# Patient Record
Sex: Female | Born: 1955
Health system: Southern US, Community
[De-identification: ages and names within clinical notes are randomized; demographics above are authoritative.]

## PROBLEM LIST (undated history)

## (undated) DIAGNOSIS — G4730 Sleep apnea, unspecified: Secondary | ICD-10-CM

## (undated) DIAGNOSIS — R112 Nausea with vomiting, unspecified: Secondary | ICD-10-CM

## (undated) DIAGNOSIS — E039 Hypothyroidism, unspecified: Secondary | ICD-10-CM

## (undated) DIAGNOSIS — E785 Hyperlipidemia, unspecified: Secondary | ICD-10-CM

## (undated) DIAGNOSIS — K831 Obstruction of bile duct: Secondary | ICD-10-CM

## (undated) DIAGNOSIS — Z9889 Other specified postprocedural states: Secondary | ICD-10-CM

## (undated) HISTORY — PX: MYOMECTOMY: SHX85

## (undated) HISTORY — PX: TONSILLECTOMY: SHX5217

## (undated) HISTORY — DX: Sleep apnea, unspecified: G47.30

## (undated) HISTORY — PX: OTHER SURGICAL HISTORY: SHX169

## (undated) HISTORY — PX: CHOLECYSTECTOMY: SHX55

## (undated) HISTORY — DX: Hyperlipidemia, unspecified: E78.5

## (undated) HISTORY — PX: ERCP: SHX60

---

## 1999-09-09 ENCOUNTER — Other Ambulatory Visit: Admission: RE | Admit: 1999-09-09 | Discharge: 1999-09-09 | Payer: Self-pay | Admitting: Gynecology

## 2001-06-09 ENCOUNTER — Ambulatory Visit (HOSPITAL_COMMUNITY): Admission: RE | Admit: 2001-06-09 | Discharge: 2001-06-09 | Payer: Self-pay | Admitting: Gynecology

## 2001-06-09 ENCOUNTER — Encounter: Payer: Self-pay | Admitting: Gynecology

## 2001-06-28 ENCOUNTER — Ambulatory Visit (HOSPITAL_COMMUNITY): Admission: RE | Admit: 2001-06-28 | Discharge: 2001-06-28 | Payer: Self-pay | Admitting: Internal Medicine

## 2001-06-28 ENCOUNTER — Encounter: Payer: Self-pay | Admitting: Internal Medicine

## 2001-07-01 ENCOUNTER — Other Ambulatory Visit: Admission: RE | Admit: 2001-07-01 | Discharge: 2001-07-01 | Payer: Self-pay | Admitting: Gynecology

## 2003-07-04 ENCOUNTER — Other Ambulatory Visit: Admission: RE | Admit: 2003-07-04 | Discharge: 2003-07-04 | Payer: Self-pay | Admitting: Gynecology

## 2003-07-07 ENCOUNTER — Ambulatory Visit (HOSPITAL_COMMUNITY): Admission: RE | Admit: 2003-07-07 | Discharge: 2003-07-07 | Payer: Self-pay | Admitting: Gynecology

## 2004-02-12 ENCOUNTER — Ambulatory Visit (HOSPITAL_COMMUNITY): Admission: RE | Admit: 2004-02-12 | Discharge: 2004-02-12 | Payer: Self-pay | Admitting: Internal Medicine

## 2004-12-11 ENCOUNTER — Other Ambulatory Visit: Admission: RE | Admit: 2004-12-11 | Discharge: 2004-12-11 | Payer: Self-pay | Admitting: Gynecology

## 2005-03-10 ENCOUNTER — Ambulatory Visit (HOSPITAL_COMMUNITY): Admission: RE | Admit: 2005-03-10 | Discharge: 2005-03-10 | Payer: Self-pay | Admitting: Internal Medicine

## 2005-03-18 ENCOUNTER — Ambulatory Visit (HOSPITAL_COMMUNITY): Admission: RE | Admit: 2005-03-18 | Discharge: 2005-03-18 | Payer: Self-pay | Admitting: General Surgery

## 2005-03-18 ENCOUNTER — Encounter (INDEPENDENT_AMBULATORY_CARE_PROVIDER_SITE_OTHER): Payer: Self-pay | Admitting: Specialist

## 2005-03-27 ENCOUNTER — Ambulatory Visit: Payer: Self-pay | Admitting: Internal Medicine

## 2006-03-18 ENCOUNTER — Encounter (INDEPENDENT_AMBULATORY_CARE_PROVIDER_SITE_OTHER): Payer: Self-pay | Admitting: *Deleted

## 2006-03-18 ENCOUNTER — Ambulatory Visit (HOSPITAL_COMMUNITY): Admission: RE | Admit: 2006-03-18 | Discharge: 2006-03-18 | Payer: Self-pay | Admitting: Internal Medicine

## 2006-03-18 ENCOUNTER — Ambulatory Visit: Payer: Self-pay | Admitting: Internal Medicine

## 2006-05-11 ENCOUNTER — Ambulatory Visit: Payer: Self-pay | Admitting: Orthopedic Surgery

## 2006-05-25 ENCOUNTER — Emergency Department (HOSPITAL_COMMUNITY): Admission: EM | Admit: 2006-05-25 | Discharge: 2006-05-25 | Payer: Self-pay | Admitting: Emergency Medicine

## 2006-06-04 ENCOUNTER — Ambulatory Visit (HOSPITAL_COMMUNITY): Admission: RE | Admit: 2006-06-04 | Discharge: 2006-06-04 | Payer: Self-pay | Admitting: Internal Medicine

## 2006-06-15 ENCOUNTER — Ambulatory Visit: Payer: Self-pay | Admitting: Orthopedic Surgery

## 2007-04-30 ENCOUNTER — Ambulatory Visit (HOSPITAL_COMMUNITY): Admission: RE | Admit: 2007-04-30 | Discharge: 2007-04-30 | Payer: Self-pay | Admitting: Internal Medicine

## 2007-05-18 ENCOUNTER — Ambulatory Visit: Payer: Self-pay | Admitting: Orthopedic Surgery

## 2007-05-18 DIAGNOSIS — S52539A Colles' fracture of unspecified radius, initial encounter for closed fracture: Secondary | ICD-10-CM | POA: Insufficient documentation

## 2007-06-17 ENCOUNTER — Ambulatory Visit: Payer: Self-pay | Admitting: Orthopedic Surgery

## 2007-06-17 DIAGNOSIS — M7150 Other bursitis, not elsewhere classified, unspecified site: Secondary | ICD-10-CM | POA: Insufficient documentation

## 2007-09-20 ENCOUNTER — Emergency Department (HOSPITAL_COMMUNITY): Admission: EM | Admit: 2007-09-20 | Discharge: 2007-09-20 | Payer: Self-pay | Admitting: Emergency Medicine

## 2007-09-22 ENCOUNTER — Ambulatory Visit (HOSPITAL_COMMUNITY): Admission: RE | Admit: 2007-09-22 | Discharge: 2007-09-22 | Payer: Self-pay | Admitting: Internal Medicine

## 2007-10-04 ENCOUNTER — Ambulatory Visit (HOSPITAL_COMMUNITY): Admission: RE | Admit: 2007-10-04 | Discharge: 2007-10-04 | Payer: Self-pay | Admitting: Internal Medicine

## 2007-12-15 ENCOUNTER — Ambulatory Visit (HOSPITAL_COMMUNITY): Admission: RE | Admit: 2007-12-15 | Discharge: 2007-12-15 | Payer: Self-pay | Admitting: Internal Medicine

## 2008-02-21 ENCOUNTER — Ambulatory Visit (HOSPITAL_COMMUNITY): Admission: RE | Admit: 2008-02-21 | Discharge: 2008-02-21 | Payer: Self-pay | Admitting: Internal Medicine

## 2008-07-27 ENCOUNTER — Encounter: Payer: Self-pay | Admitting: Orthopedic Surgery

## 2008-07-27 LAB — CONVERTED CEMR LAB
ALT: 46 units/L — ABNORMAL HIGH (ref 0–35)
AST: 33 units/L (ref 0–37)
Albumin: 3.9 g/dL (ref 3.5–5.2)
Alkaline Phosphatase: 112 units/L (ref 39–117)
Bilirubin, Direct: 0.1 mg/dL (ref 0.0–0.3)

## 2008-08-01 ENCOUNTER — Ambulatory Visit (HOSPITAL_COMMUNITY): Admission: RE | Admit: 2008-08-01 | Discharge: 2008-08-01 | Payer: Self-pay | Admitting: Internal Medicine

## 2008-10-12 ENCOUNTER — Encounter (INDEPENDENT_AMBULATORY_CARE_PROVIDER_SITE_OTHER): Payer: Self-pay | Admitting: Internal Medicine

## 2008-10-12 ENCOUNTER — Ambulatory Visit (HOSPITAL_COMMUNITY): Admission: RE | Admit: 2008-10-12 | Discharge: 2008-10-13 | Payer: Self-pay | Admitting: Internal Medicine

## 2008-12-04 ENCOUNTER — Encounter: Payer: Self-pay | Admitting: Internal Medicine

## 2009-04-23 IMAGING — CR DG ABDOMEN 1V
2 series · 2 of 2 positions shown · non-contrast
Comparison: 09/22/2007 ERCP.

CLINICAL DATA: Recurrent biliary colic.  Previous placement of
stents within the pancreatic duct and common bile duct.
Reevaluation.

ABDOMEN - 1 VIEW

[view not recorded (1 of 2)]
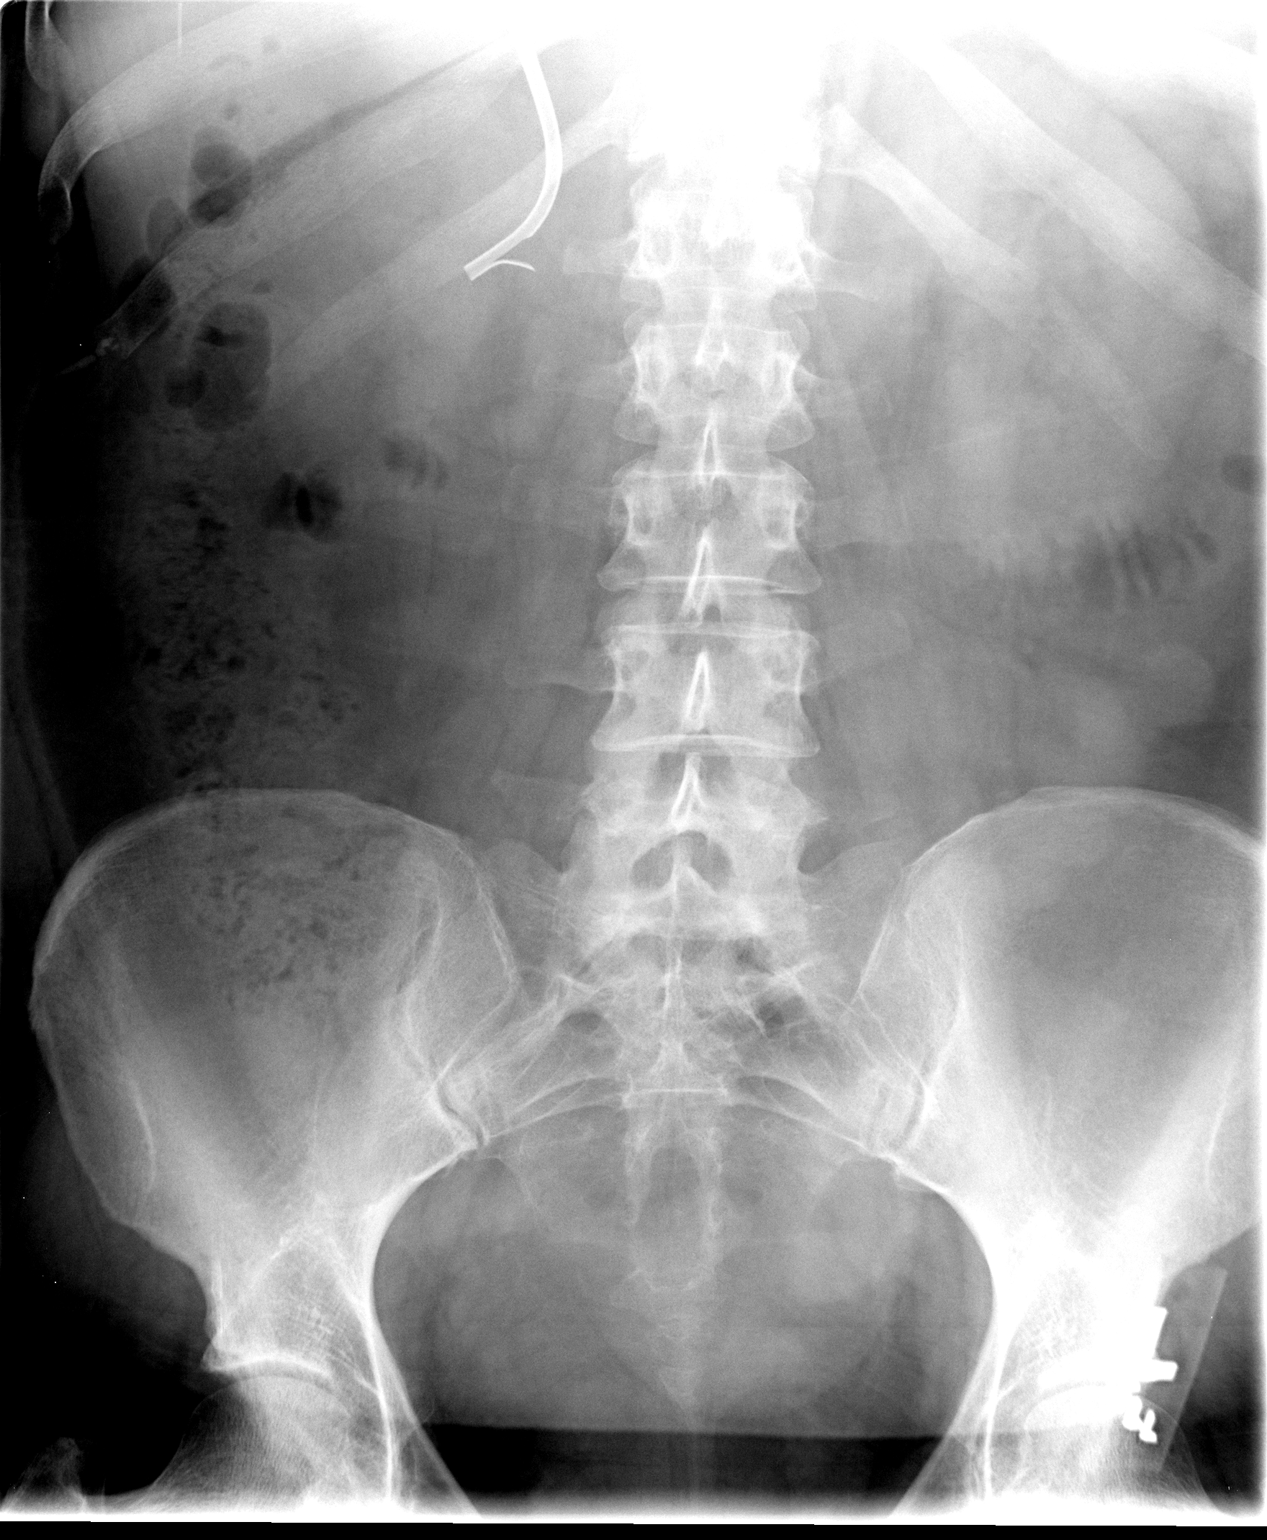

[view not recorded (2 of 2)]
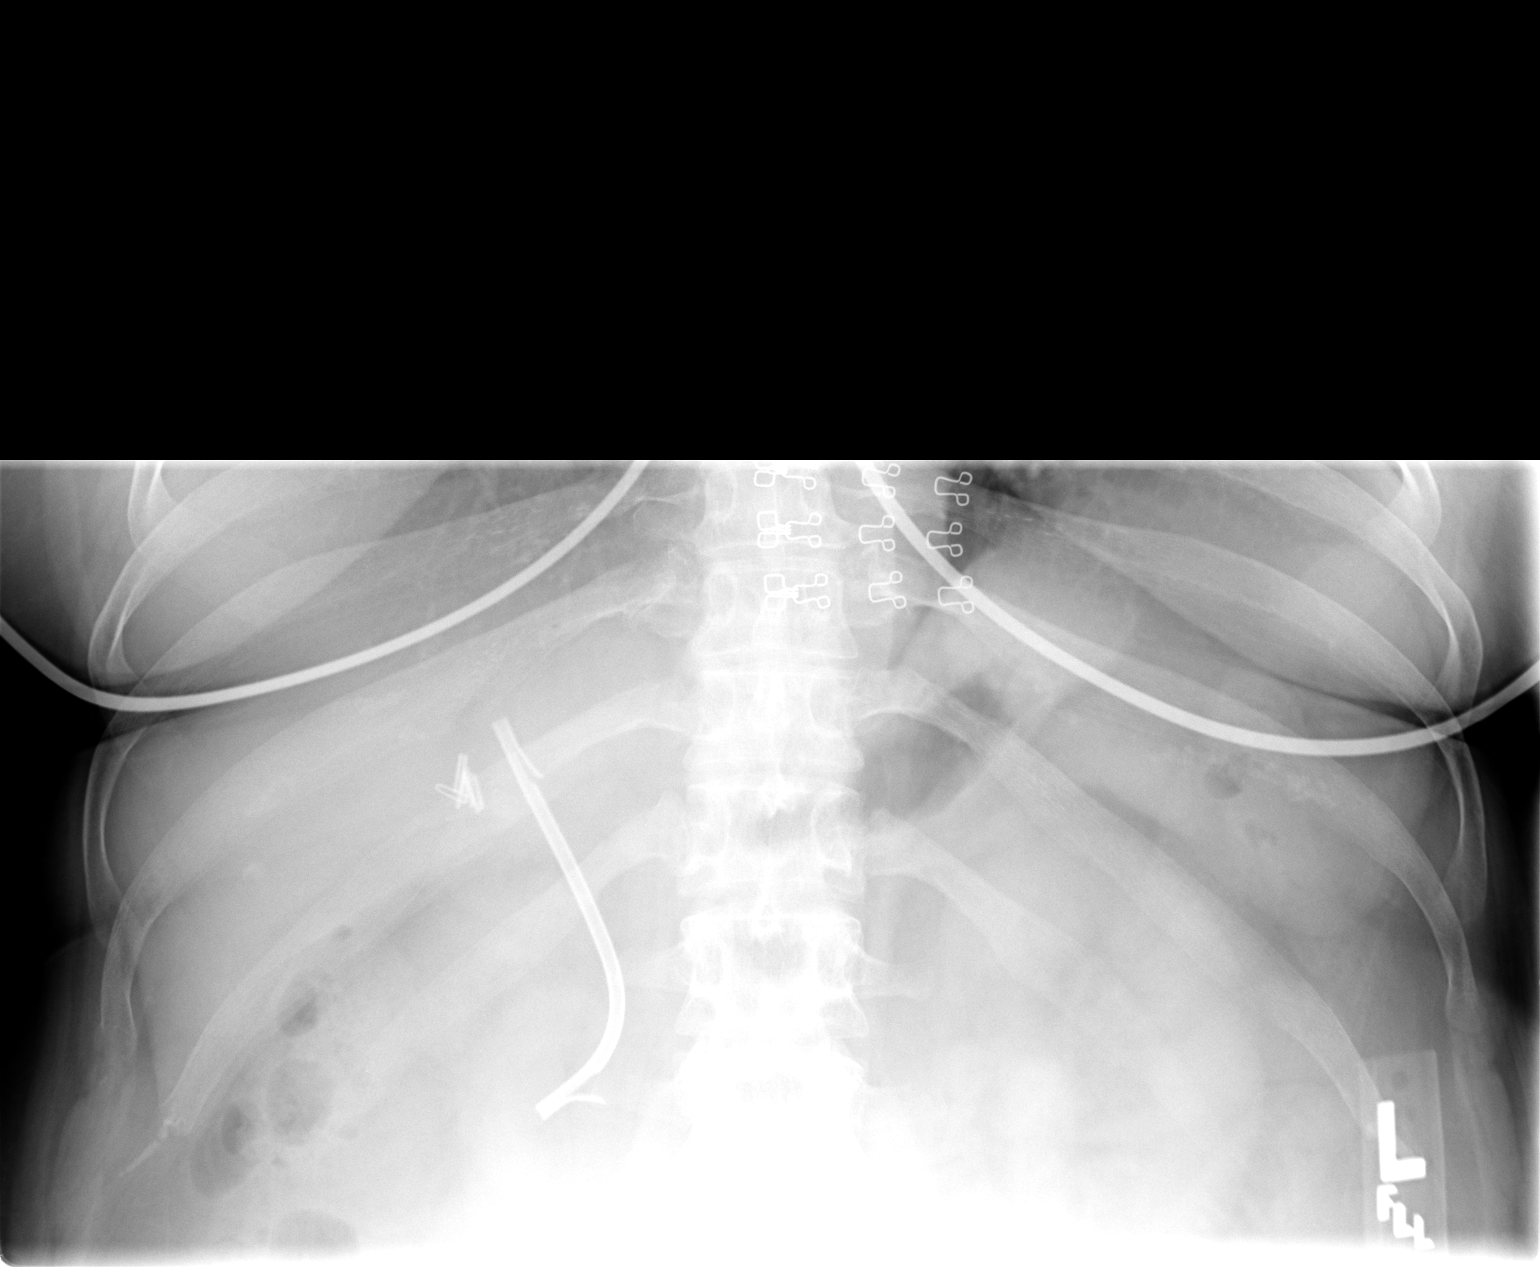

[2 of 2 positions shown; findings below may reference images not displayed]

FINDINGS: There is a stent present within the common bile duct.
The stent previously placed within the pancreatic duct is no longer
present.  The bowel gas pattern is normal.  There are surgical
clips within the right upper quadrant from prior cholecystectomy.
IMPRESSION: A stent remains in the region of the common bile duct.  The stent
appears in satisfactory position.  The pancreatic duct stent is no
longer present.

## 2010-06-16 ENCOUNTER — Encounter: Payer: Self-pay | Admitting: Gynecology

## 2010-06-17 ENCOUNTER — Encounter: Payer: Self-pay | Admitting: Internal Medicine

## 2010-07-01 ENCOUNTER — Ambulatory Visit (INDEPENDENT_AMBULATORY_CARE_PROVIDER_SITE_OTHER): Payer: Self-pay | Admitting: Internal Medicine

## 2010-09-03 LAB — HEPATIC FUNCTION PANEL
ALT: 86 U/L — ABNORMAL HIGH (ref 0–35)
AST: 53 U/L — ABNORMAL HIGH (ref 0–37)
Albumin: 4 g/dL (ref 3.5–5.2)
Alkaline Phosphatase: 160 U/L — ABNORMAL HIGH (ref 39–117)
Bilirubin, Direct: 0.1 mg/dL (ref 0.0–0.3)
Total Bilirubin: 0.5 mg/dL (ref 0.3–1.2)

## 2010-09-03 LAB — DIFFERENTIAL
Basophils Absolute: 0 10*3/uL (ref 0.0–0.1)
Eosinophils Absolute: 0.2 10*3/uL (ref 0.0–0.7)
Monocytes Relative: 6 % (ref 3–12)
Neutrophils Relative %: 73 % (ref 43–77)

## 2010-09-03 LAB — CBC
HCT: 41.7 % (ref 36.0–46.0)
WBC: 7.3 10*3/uL (ref 4.0–10.5)

## 2010-10-08 NOTE — Op Note (Signed)
NAME:  Cynthia Donaldson, Cynthia Donaldson               ACCOUNT NO.:  [id number]   MEDICAL RECORD NO.:  [id number]          PATIENT TYPE:  AMB   LOCATION:  DAY                           FACILITY:  [facility]   PHYSICIAN:  Lionel December, M.D.    DATE OF BIRTH:  01-Oct-1955   DATE OF PROCEDURE:  10/12/2008  DATE OF DISCHARGE:                               OPERATIVE REPORT   PROCEDURE:  Esophagogastroduodenoscopy.   INDICATIONS:  Cynthia Donaldson is a 55 year old Caucasian female with recurrent  epigastric pain and nausea.  Symptoms worsened in the morning.  She is  on PPI for chronic GERD and Hartmann, well controlled.  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  Her LFTs have been normal except mildly  elevated alkaline phosphatase.  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  At times,  she feels  the same pain that she had prior to sphincterotomy.  We have  not been able to document bump in transaminases with these episodes of  pain.  She is undergoing diagnostic EGD.  She has never been tested or  treated for H. pylori gastritis.  Procedure and risks were reviewed with  the patient and informed consent was obtained.   MEDICATIONS FOR CONSCIOUS SEDATION:  1. Cetacaine spray for pharyngeal topical anesthesia.  2. Demerol 50 mg IV.  3. Versed 8 mg IV in divided dose.   FINDINGS:  Procedure performed in endoscopy suite.  The patient's vital  signs and O2 sat were monitored during the procedure and remained  stable.  The patient was placed in left lateral recumbent position and  Pentax video scope was passed oropharynx without any difficulty into  esophagus.   Esophagus, mucosa of the esophagus was normal.  The GE junction was at  36 cm from the incisors.  There was focal erythema, but no erosions of  the ring was noted.  Hiatus was at 38 cm.   Stomach, it had some bile but there was no food debris.  Stomach  distended, very well insufflation.  Folds and  proximal stomach normal.  Examination of mucosa revealed few anterior erosions and focal erythema,  but no ulcer crater was noted.  Pyloric channel was patent.  Angularis,  fundus, and cardia were examined by retroflexing the scope were normal.   Duodenum, bulbar mucosa was normal.  Scope was passed to second part of  the duodenal mucosa and folds were normal.  Only the low part of the  ampulla was seen with this forward-viewing scope.  Endoscope was  withdrawn.   On the way out, antral biopsies were taken looking for H. pylori  gastritis or eosinophilic gastritis.  Endoscope was withdrawn.  The  patient tolerated the procedure well.  Please note, procedure was also  attended by Dr. Jena Gauss.   FINAL DIAGNOSIS:  Small sliding hiatal hernia with mild focal changes of  reflux esophagitis at gastroesophageal junction.   Erosive antral gastritis.   No evidence of peptic ulcer disease or pyloric stenosis.   Antral biopsies taken for routine histology.   RECOMMENDATIONS:  She will continue antireflux measures and Zegerid as  before.  Trial with Carafate liquid 2 g at bedtime and 1 g 1 hour before  breakfast.  Prescription given for 60 g with 5 refills.   We will check CBC with diff and LFTs today and I will be contacting Ms.  Talisa with the results of biopsy and blood test.      Lionel December, M.D.  Electronically Signed     NR/MEDQ  D:  10/12/2008  T:  10/13/2008  Job:  [id number]   cc:   Kingsley Callander. Ouida Sills, MD  Fax: [phone]

## 2010-10-08 NOTE — Op Note (Signed)
NAME:  Cynthia Donaldson, HONE                  ACCOUNT NO.:  [id number]   MEDICAL RECORD NO.:  [id number]          PATIENT TYPE:  AMB   LOCATION:  DAY                           FACILITY:  [facility]   PHYSICIAN:  Lionel December, M.D.    DATE OF BIRTH:  [birth year]   DATE OF PROCEDURE:  DATE OF DISCHARGE:                               OPERATIVE REPORT   PROCEDURE:  Endoscopic retrograde cholangiopancreatography with biliary  stent removal.   INDICATION:  Cynthis is a 55 year old Caucasian female with history of  biliary colic surgery to sphincter of Oddi dysfunction.  She had ERCP  with sphincterotomy and PD and biliary stenting on September 22, 2007.  She  passed the pancreatic stents spontaneously.  She has done well except  she has had fleeting pain, which has been lateral to the pain that she  used to have before.  She is undergoing ERCP for removal of stent,  repeat cholangiography, and extension of the sphincterotomy if  necessary.  Procedure risks were reviewed with the patient and informed  consent was obtained.   MEDS:  Versed sedation.   ANESTHESIA:  Please see anesthesia records for details.   FINDINGS:  Procedure performed in the OR.  The patient was placed under  anesthesia.  She was intubated and turned into semi-prone position.  Therapeutic Pentax videoduodenoscope was passed via the oropharynx,  esophagus, across the stomach and pylorus into the bulb and descending  duodenum and ampulla.  The stent was in place.  The distal port was  completely filled with debris.  There was some mild draining around the  stent.  The stent was cut with snare and removed under fluoroscopic  control.  Endoscope was passed into the second part of duodenum.  A CBD  was easily was cannulated with RX44 autotome and 0.035 Hydra Jagwire.  Dilute contrast was injected.  There was a free flow of contrast in the  duodenum.  Therefore, occlusion cholangiogram was obtained.  There were  no filling defects noted.  CBD  and CHD were mildly dilated.  About 8-10  mm, most pronounced at the level of common hepatic ducts.  Intrahepatic  biliary radicles were normal.  There was sigmoid shape to the distal  segment of CBD and a 10-12 balloon was passed across the ampulla a  couple of times without significant resistance.  There was excellent  flow of contrast in the duodenum.  Therefore, sphincterotomy was not  extended, and the scope was withdrawn.  The patient tolerated the  procedure well, and she was extubated and brought to PACU in stable  condition.   FINAL DIAGNOSES:  1. Biliary stent removed.  2. Mildly dilated biliary system, but no evidence of stones or      stricture.  Distal segment of bile duct, sigmoid shape without      stricture.   RECOMMENDATIONS:  1. She will be going home later today, when she is recovered from      anesthesia.  2. She will be on clear liquids today, and resume her usual diet in  a.m.  3. She will have LFTs repeated in 6-8 weeks.      Lionel December, M.D.  Electronically Signed     NR/MEDQ  D:  12/15/2007  T:  12/16/2007  Job:  [id number]   cc:   Kingsley Callander. Ouida Sills, MD  Fax: [phone]

## 2010-10-08 NOTE — Op Note (Signed)
NAME:  Cynthia Donaldson, Cynthia Donaldson                  ACCOUNT NO.:  [id number]   MEDICAL RECORD NO.:  [id number]          PATIENT TYPE:  AMB   LOCATION:  DAY                           FACILITY:  [facility]   PHYSICIAN:  Lionel December, M.D.    DATE OF BIRTH:  1956-02-09   DATE OF PROCEDURE:  09/22/2007  DATE OF DISCHARGE:                                PROCEDURE NOTE   PROCEDURES:  1. ERCP with biliary sphincterotomy.  2. Biliary and pancreatic stenting.   INDICATION:  Cynthia Donaldson is a 55 year old Caucasian female with recurrent right  upper quadrant pain associated with bump in her transaminases.  She has  had at least 2 well-documented episodes.  She also has dilated biliary  system.  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  She is undergoing therapeutic procedure.  Procedure risks  were reviewed with the patient and informed consent was obtained.   MEDS:  Sedation.   ANESTHESIA:  Please see anesthesia records for details.   FINDINGS:  Procedure performed in the OR.  After the patient was placed  under anesthesia, she was intubated and turned into semi-prone position.  Pentax video duodenoscope was passed via the oropharynx without any  difficulty into esophagus and stomach.  Gastric mucosa was normal.  Pyloric exam was patent.  Scope was easily passed into the bulb and  descending duodenum.  Ampulla of Vater was normal.  A short intramural  segment.  The approach was ideal and optimal.  Cannulation was attempted  with RX 44 autotome and 0.035 Hydra Jagwire.  Pancreatic duct was  initially cannulated and gently filled with diluted contrast.  No  defects were noted.  Guidewire was left in the pancreatic duct and the  sphincterotome was reloaded with another wire and cannulation attempted.  Difficult to selectively cannulate the CBD.  Once it was cannulated,  noted significant resistance as I passed the sphincterotome over with  guidewire.  Dilute contrast was injected into the  biliary system.  CBD  and CHD were dilated with distal tapering, no filling defects noted.  CHD measured 12-13 mm in maximal diameter.  Intrahepatic biliary  radicles were well felt and no ductal abnormalities noted (please note  that the patient has biopsy-proven PBC).  Biliary sphincterotomy was  performed.  The draining still was not adequate.  I was able to pass 10-  mm balloon across the papilla.  There appeared to be a membrane wrapped  around the catheter, roof of ampulla appeared to have 2 different  layers.  I therefore decided to leave the biliary stent.  A 10-French 7-  cm long plastic stent was placed for biliary decompression without any  difficulty, followed by placement of 4-French 5-cm long single-pigtail  pancreatic stent.  Endoscope was withdrawn.  The patient was extubated  and taken to PACU.  She tolerated the procedure well.   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  2. Sphincterotomy performed and biliary stent left in place.  3. A 4-French 5-cm single-pigtail pancreatic stent also left in place  to prevent risk of pancreatitis.   RECOMMENDATIONS:  If she does well, she will be going home later this  morning, clear liquids today, and she will start usual diet in the a.m.  She will resume her Synthroid, Urso and Wellbutrin as before.   She will return in for repeat procedure in 6-8 weeks.      Lionel December, M.D.  Electronically Signed     NR/MEDQ  D:  09/22/2007  T:  09/23/2007  Job:  [id number]   cc:   Kingsley Callander. Ouida Sills, MD  Fax: [phone]   R. Roetta Sessions, M.D.  [address]  [place] [address]

## 2010-10-11 NOTE — Op Note (Signed)
NAME:  Cynthia Donaldson, Cynthia Donaldson                  ACCOUNT NO.:  [id number]   MEDICAL RECORD NO.:  [id number]          PATIENT TYPE:  AMB   LOCATION:  DAY                          FACILITY:  [facility]   PHYSICIAN:  Angelia Mould. Derrell Lolling, M.D.DATE OF BIRTH:  Mar 28, 1956   DATE OF PROCEDURE:  03/18/2005  DATE OF DISCHARGE:                                 OPERATIVE REPORT   PREOPERATIVE DIAGNOSES:  1.  Chronic cholecystitis with possible cholelithiasis.  2.  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   POSTOPERATIVE DIAGNOSES:  1.  Chronic cholecystitis with possible cholelithiasis.  2.  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   OPERATION PERFORMED:  1.  Multiple 14 gauge core needle liver biopsies.  2.  Laparoscopic cholecystectomy with intraoperative cholangiogram.   SURGEON:  Dr. Claud Kelp   FIRST ASSISTANT:  Dr. Luretha Murphy   OPERATIVE INDICATION:  This is a 55 year old white female, who has had  symptoms consistent with biliary colic for many years.  She has had more  nausea recently.  She has never had fever.  For the past 3 years, she has  been noted have elevated liver function tests with mild elevation of  transaminases and more pronounced elevation of alkaline phosphatase over  250.  Her other lab work for hepatitis and other diseases have always been  normal.  Her ultrasounds have always been normal except for a fatty liver.  Recently her alkaline phosphatase shot up to 405, and she had more blood  work, and her antimitochondrial antibody was up to 2.1, and hepatitis  studies remained normal, and her gastroenterologist, Dr. Karilyn Cota, became  concerned that she might have primary biliary cirrhosis.  She had another  ultrasound which showed possible small gallstone, borderline biliary  dilatation. and minimal fatty infiltration of the liver.  I discussed her  care with her husband, Dr. Roetta Sessions, who is a gastroenterologist in  [place].  She is brought to the operating room electively for  liver  biopsy and cholecystectomy.   OPERATIVE FINDINGS:  The liver was smooth but slightly discolored consistent  with fatty infiltration.  There were no nodularities or masses in the liver.  There was no ascites.  The peritoneal surfaces were normal.  The gallbladder  was chronically inflamed.  There were extensive adhesions throughout the  gallbladder.  Only the fundus was visible initially, and we had to take down  extensive chronic adhesions, suggesting that she had had multiple prior  inflammatory episodes.  The anatomy of the cystic duct, cystic artery, and  common bile duct were conventional.  The cholangiogram showed a normal  biliary tree.  No strictures or dilatations.  There were no filling defects,  no obstruction, and the contrast went into the duodenum well.  There was no  sign of any stones in the biliary tree.  After I removed the gallbladder, I  opened the gallbladder and did not find any stones, but the gallbladder was  chronically inflamed.  The small bowel, large bowel, stomach, duodenum  otherwise looked normal.   OPERATIVE TECHNIQUE:  Following the induction of general endotracheal  anesthesia, the patient  was identified.  Intravenous antibiotics were given.  The abdomen was prepped and draped in a sterile fashion.  Marcaine 0.5% with  epinephrine was used as a local infiltration anesthetic.  A vertically  oriented incision was made at the lower rim of the umbilicus through a  previous scar.  The fascia was incised in the midline and the abdominal  cavity entered under direct vision.  A 10 mm Hassan trocar was inserted and  secured with a pursestring suture of 0 Vicryl.  Pneumoperitoneum was  created.  Video cam was inserted with visualization and findings as  described above.  A 10 mm trocar was placed in the subxiphoid region and two  5 mm trocars placed in the right mid abdomen.  We made a small incision in  the subcostal space on the right side in the  midclavicular line.  We took  three 14 gauge core needle liver biopsies, 2 from the right lobe and 1 from  the medial segment of the left lobe.  These were all good, complete cores,  and they were sent to the lab for histology.  We cauterized the capsule of  liver where we stuck the needle, and hemostasis was good.  We irrigated out  a little bit of blood clot that had accumulated and then turned our  attention to the gallbladder.   The fundus of the gallbladder was elevated.  We carefully took down all the  chronic adhesions until we could identify the body and infundibulum of the  gallbladder.  We dissected out the cystic duct and cystic artery. We  isolated the cystic artery as it went on the wall of the gallbladder,  secured it with multiple clips and divided it.  We created a large window  behind the cystic duct.  A metal clip was placed on the cystic duct close to  the gallbladder.  A cholangiogram catheter was inserted into the cystic duct  and a cholangiogram obtained using the C-arm.  This showed a borderline  dilated biliary tree but no other was structural or anatomic abnormalities,  no filling defect, and there was good flow of contrast into the duodenum.  The cholangiogram catheter was removed.  The cystic duct was secured with  multiple metal clips and divided.  The gallbladder was dissected from its  bed with electrocautery, placed in a specimen bag, and removed.  It should  be noted that this dissection was somewhat difficult due to chronic  inflammation.  The bed of the gallbladder was irrigated copiously.  Hemostasis was excellent and achieved with electrocautery.  We irrigated out  the subhepatic space and the subphrenic space and at the completion of the  case, all of the irrigation fluid was completely clear.  There was no  bleeding and no bile leak whatsoever.  The trocars were removed under direct vision, and there was no bleeding from the trocar sites.  The   pneumoperitoneum was released.  The fascia and the umbilicus was closed with  0 Vicryl sutures.  Skin incisions were closed subcuticular sutures of 4-0  Vicryl and Steri-Strips.  Clean bandages were placed and the patient taken  to recovery in stable condition.  The estimated blood loss was about 25 mL.  Complications were none.  Sponge and instrument counts were correct.      Angelia Mould. Derrell Lolling, M.D.  Electronically Signed     HMI/MEDQ  D:  03/18/2005  T:  03/18/2005  Job:  [id number]   cc:  Jonathon Bellows, M.D.  [address]  [place]  [place] [address]   Lionel December, M.D.  [address]  [place] [address]   Kingsley Callander. Ouida Sills, MD  Fax: [phone]

## 2010-10-11 NOTE — Op Note (Signed)
NAME:  Cynthia Donaldson, Cynthia Donaldson                  ACCOUNT NO.:  [id number]   MEDICAL RECORD NO.:  [id number]          PATIENT TYPE:  AMB   LOCATION:  DAY                           FACILITY:  [facility]   PHYSICIAN:  Lionel December, M.D.    DATE OF BIRTH:  Jan 09, 1956   DATE OF PROCEDURE:  03/18/2006  DATE OF DISCHARGE:                                 OPERATIVE REPORT   PROCEDURE:  Esophagogastroduodenoscopy followed by colonoscopy.   INDICATIONS:  Macayla is a 55 year old Caucasian female who has recurrent  epigastric and right upper quadrant pain.  She has early stage PVC and is on  ___________ with normalization of her transaminases.  She is status post  cholecystectomy last year.  She is undergoing diagnostic EGD.  She is also  undergoing high-risk screening colonoscopy.  Her brother was 73 years old  was recently diagnosed with metastatic colon carcinoma.  Procedure risks  were reviewed with the patient, informed consent was obtained.   MEDS FOR CONSCIOUS SEDATION:  Benzocaine spray for pharyngeal topical  anesthesia, Demerol 50 mg IV, Versed 8 mg IV.  Zofran 4 mg IV given at the  end of the procedures.   FINDINGS:  Procedures performed in endoscopy suite.  The patient's vital  signs and O2 saturation were monitored during the procedure and remained  stable.   PROCEDURE #1:  Esophagogastroduodenoscopy.  The patient was placed left  lateral position.  Olympus videoscope was passed via oropharynx without any  difficulty into esophagus.   Esophagus. Mucosa of the esophagus was normal.  No varices were identified.  There was 6 x 8 mm patch of gastric type mucosa proximal to GE junction  which was biopsied for histology on the way out.  GE junction was at 38 cm  and hiatus was at 40.   Stomach was empty and distended very well with insufflation.  Folds of  proximal stomach were normal.  Examination mucosa at body, antrum, pyloric  channel as well as angularis, fundus and cardia was normal.   Duodenum  bulbar mucosa was normal.  Scope was passed into second part of  duodenum where mucosa and folds were normal.  Endoscope was withdrawn and  the patient prepared for procedure #2.   PROCEDURE #2:  Colonoscopy.  Rectal examination performed.  No abnormality  noted on external or digital exam.  Olympus videoscope was placed in rectum  and advanced under vision into sigmoid colon and beyond.  Preparation was  excellent except she had some brown liquid in cecum which was suctioned out.  Cecum was identified by ileocecal valve and appendiceal orifice.  The area  behind the valve was well seen was normal.  As the scope was withdrawn  colonic mucosa was examined for the second time and was normal throughout.  Rectal mucosa similarly was normal.  Scope was retroflexed to examine  anorectal junction and single small hemorrhoids noted below the dentate  line.  Endoscope was straightened and withdrawn.  The patient tolerated the  procedure well.   FINAL DIAGNOSIS:  Small sliding hiatal hernia.  Small patch of gastric type  mucosa proximal but contiguous with GE junction which was biopsied for  histology to make sure we not dealing with short segment Barrett's.  Normal  exam of stomach, first and second part of duodenum.  Normal colonoscopy  except small external hemorrhoids.   RECOMMENDATIONS:  We will check her LFTs today.   We will hold her __________ 1-2 weeks and see if her abdominal pain gets any  better.  If not we will need further evaluation.      Lionel December, M.D.  Electronically Signed     NR/MEDQ  D:  03/18/2006  T:  03/19/2006  Job:  [id number]   cc:   Kingsley Callander. Ouida Sills, MD  Fax: [phone]

## 2010-12-19 ENCOUNTER — Other Ambulatory Visit (HOSPITAL_COMMUNITY): Payer: Self-pay | Admitting: Internal Medicine

## 2010-12-19 DIAGNOSIS — Z139 Encounter for screening, unspecified: Secondary | ICD-10-CM

## 2010-12-25 ENCOUNTER — Ambulatory Visit: Payer: Self-pay | Admitting: Orthopedic Surgery

## 2010-12-27 ENCOUNTER — Encounter: Payer: Self-pay | Admitting: *Deleted

## 2010-12-27 ENCOUNTER — Inpatient Hospital Stay (HOSPITAL_COMMUNITY)
Admission: EM | Admit: 2010-12-27 | Discharge: 2010-12-27 | DRG: 446 | Disposition: A | Discharge status: Home / Self Care | Payer: 59 | Attending: Internal Medicine | Admitting: Internal Medicine

## 2010-12-27 ENCOUNTER — Emergency Department (HOSPITAL_COMMUNITY): Payer: 59

## 2010-12-27 DIAGNOSIS — K838 Other specified diseases of biliary tract: Principal | ICD-10-CM | POA: Diagnosis present

## 2010-12-27 DIAGNOSIS — E039 Hypothyroidism, unspecified: Secondary | ICD-10-CM | POA: Diagnosis present

## 2010-12-27 DIAGNOSIS — D259 Leiomyoma of uterus, unspecified: Secondary | ICD-10-CM | POA: Diagnosis present

## 2010-12-27 DIAGNOSIS — R7989 Other specified abnormal findings of blood chemistry: Secondary | ICD-10-CM

## 2010-12-27 DIAGNOSIS — K745 Biliary cirrhosis, unspecified: Secondary | ICD-10-CM | POA: Diagnosis present

## 2010-12-27 DIAGNOSIS — K8050 Calculus of bile duct without cholangitis or cholecystitis without obstruction: Secondary | ICD-10-CM

## 2010-12-27 HISTORY — DX: Obstruction of bile duct: K83.1

## 2010-12-27 HISTORY — DX: Hypothyroidism, unspecified: E03.9

## 2010-12-27 LAB — CBC
HCT: 43 % (ref 36.0–46.0)
Hemoglobin: 14.8 g/dL (ref 12.0–15.0)
MCH: 30.7 pg (ref 26.0–34.0)
MCHC: 34.4 g/dL (ref 30.0–36.0)
MCV: 89.2 fL (ref 78.0–100.0)
RBC: 4.82 MIL/uL (ref 3.87–5.11)

## 2010-12-27 LAB — COMPREHENSIVE METABOLIC PANEL
AST: 344 U/L — ABNORMAL HIGH (ref 0–37)
Albumin: 4 g/dL (ref 3.5–5.2)
Alkaline Phosphatase: 276 U/L — ABNORMAL HIGH (ref 39–117)
BUN: 22 mg/dL (ref 6–23)
CO2: 21 mEq/L (ref 19–32)
Calcium: 10 mg/dL (ref 8.4–10.5)
Chloride: 99 mEq/L (ref 96–112)
Creatinine, Ser: 0.82 mg/dL (ref 0.50–1.10)
GFR calc Af Amer: >60 mL/min (ref >60)
GFR calc non Af Amer: >60 mL/min (ref >60)
Glucose, Bld: 146 mg/dL — ABNORMAL HIGH (ref 70–99)
Sodium: 139 mEq/L (ref 135–145)
Total Bilirubin: 2.3 mg/dL — ABNORMAL HIGH (ref 0.3–1.2)
Total Protein: 8.2 g/dL (ref 6.0–8.3)

## 2010-12-27 LAB — DIFFERENTIAL
Eosinophils Absolute: 0.1 10*3/uL (ref 0.0–0.7)
Monocytes Relative: 1 % — ABNORMAL LOW (ref 3–12)

## 2010-12-27 LAB — URINALYSIS, ROUTINE W REFLEX MICROSCOPIC: Hgb urine dipstick: NEGATIVE

## 2010-12-27 LAB — HEPATIC FUNCTION PANEL
AST: 218 U/L — ABNORMAL HIGH (ref 0–37)
Alkaline Phosphatase: 196 U/L — ABNORMAL HIGH (ref 39–117)
Bilirubin, Direct: 0.4 mg/dL — ABNORMAL HIGH (ref 0.0–0.3)
Total Bilirubin: 1 mg/dL (ref 0.3–1.2)

## 2010-12-27 MED ORDER — SODIUM CHLORIDE 0.9 % IV BOLUS (SEPSIS)
500.0000 mL | Freq: Once | INTRAVENOUS | Status: AC
  Administered 2010-12-27: 500 mL via INTRAVENOUS

## 2010-12-27 MED ORDER — HYDROMORPHONE HCL 1 MG/ML IJ SOLN: 1.0000 mg | INTRAMUSCULAR | Status: DC | PRN

## 2010-12-27 MED ORDER — HYDROMORPHONE HCL 1 MG/ML IJ SOLN
1.0000 mg | Freq: Once | INTRAMUSCULAR | Status: AC
  Administered 2010-12-27: 1 mg via INTRAVENOUS
  Filled 2010-12-27: qty 1

## 2010-12-27 MED ORDER — SODIUM CHLORIDE 0.9 % IV SOLN
INTRAVENOUS | Status: DC
  Administered 2010-12-27: 09:00:00 via INTRAVENOUS

## 2010-12-27 MED ORDER — ONDANSETRON HCL 4 MG/2ML IJ SOLN
4.0000 mg | Freq: Once | INTRAMUSCULAR | Status: AC
  Administered 2010-12-27: 4 mg via INTRAVENOUS
  Filled 2010-12-27: qty 2

## 2010-12-27 MED ORDER — SODIUM CHLORIDE 0.9 % IV BOLUS (SEPSIS)
1000.0000 mL | Freq: Once | INTRAVENOUS | Status: AC
  Administered 2010-12-27: 1000 mL via INTRAVENOUS

## 2010-12-27 MED ORDER — IOHEXOL 300 MG/ML  SOLN
100.0000 mL | Freq: Once | INTRAMUSCULAR | Status: AC | PRN
  Administered 2010-12-27: 100 mL via INTRAVENOUS

## 2010-12-27 MED ORDER — ONDANSETRON HCL 4 MG/2ML IJ SOLN
4.0000 mg | Freq: Four times a day (QID) | INTRAMUSCULAR | Status: DC | PRN
  Administered 2010-12-27: 4 mg via INTRAVENOUS
  Filled 2010-12-27: qty 2

## 2010-12-27 MED ORDER — METOCLOPRAMIDE HCL 5 MG/ML IJ SOLN
10.0000 mg | Freq: Once | INTRAMUSCULAR | Status: AC
  Administered 2010-12-27: 10 mg via INTRAVENOUS
  Filled 2010-12-27: qty 2

## 2010-12-27 MED ORDER — SODIUM CHLORIDE 0.9 % IV SOLN
Freq: Once | INTRAVENOUS | Status: AC
  Administered 2010-12-27: 03:00:00 via INTRAVENOUS

## 2010-12-27 MED ORDER — PANTOPRAZOLE SODIUM 40 MG IV SOLR
40.0000 mg | Freq: Once | INTRAVENOUS | Status: AC
  Administered 2010-12-27: 40 mg via INTRAVENOUS
  Filled 2010-12-27: qty 40

## 2010-12-27 MED ORDER — PANTOPRAZOLE SODIUM 40 MG IV SOLR: 40.0000 mg | INTRAVENOUS | Status: DC

## 2010-12-27 MED ORDER — ONDANSETRON HCL 4 MG PO TABS: 4.0000 mg | ORAL_TABLET | Freq: Four times a day (QID) | ORAL | Status: DC | PRN

## 2010-12-27 NOTE — Consult Note (Signed)
[facility] Spoke with Dr. Karilyn Cota. Advised him of CT findings and labs. He agreed patient should e admitted to the hospital and undergo ERCP. Asked that we call PMD  (Dr. Hilbert Corrigan admission.

## 2010-12-27 NOTE — ED Provider Notes (Signed)
History     CSN: [id number] Arrival date & time: No admission date for patient encounter.  Chief Complaint  Patient presents with  . Emesis   HPI Comments: Patient with recurrent episodes of vomiting that is associated with RUQ pain. No diagnosis has been made. She had her gall bladder removed 3 years ago and has had recurrent RUQ pain associated with violent vomiting. Usually the vomiting makes the pain and episode resolve.The day following a bout of vomiting she has diarrhea. 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  This episode actually began last Thursday with vomiting that resolved overnight. She was okay until two days ago while vacationing in [place]. Developed RUQ pain and vomiting.Pain is similar to pain experienced prior to sphingterotomy.The vomiting this time has lasted all day which is longer than usual. Last episode was months ago.   Patient is a 55 y.o. female presenting with vomiting. The history is provided by the patient and the spouse.  Emesis  This is a recurrent problem. The current episode started more than 2 days ago (abdominal pain and vomiting several days ago. Worse today). The problem occurs 5 to 10 times per day. Associated symptoms include abdominal pain, chills and sweats.    No past medical history on file.  No past surgical history on file.  No family history on file.  History  Substance Use Topics  . Smoking status: Not on file  . Smokeless tobacco: Not on file  . Alcohol Use: Not on file    OB History    Grav Para Term Preterm Abortions TAB SAB Ect Mult Living                  Review of Systems  Constitutional: Positive for chills.  Gastrointestinal: Positive for vomiting and abdominal pain.    Physical Exam  BP 140/80  Pulse 119  Temp(Src) 99.8 F (37.7 C) (Oral)  Resp 20  Ht 5\' 9"  (1.753 m)  Wt 230 lb (104.327 kg)  BMI 33.96 kg/m2  SpO2  96%  Physical Exam  ED Course  Procedures  MDM  Patient with continued vomiting. Additional antiemetic ordered. Ct completed. Patient with nausea improved, no longer vomiting. Abdominal pain improved. Reviewed labs, radiology results . Reviewed results with patient and her husband, Dr. Jena Gauss. Elevated LFTs and bili indicative of biliary colic i.e obstruction/stricture. Patient with h/o sphincterotomy  With renewed pain. After discussion with patient and her husband, patient to remian in the ER until later this morning when able to contact either Dr. Karilyn Cota, GI or PMD Dr. Ouida Sills for assistance with plan of treatment.        Nicoletta Dress. Colon Branch, MD 12/27/10 [phone]

## 2010-12-27 NOTE — Progress Notes (Signed)
Patient is resting comfortably. She has had no further vomiting or abdominal pain.

## 2010-12-27 NOTE — Consult Note (Signed)
Spoke with Dr. Ouida Sills, PMD for patient. Reviewed findings. He will be in to admit patient from the ED.

## 2010-12-27 NOTE — ED Notes (Signed)
Pt reports she just returned from a trip to [place] today and has been vomiting for the past 12 hs

## 2010-12-27 NOTE — Progress Notes (Signed)
IV removed, site WNL.  Pt given d/c instructions, no new medications.  Discussed home care with patient and discussed home medications, patient verbalizes undstanding. Pt refused wheelchair, escorted to main entrance by family members.

## 2010-12-28 NOTE — Discharge Summary (Signed)
NAMEANGLA, Cynthia NO.:  [id number]  MEDICAL RECORD NO.:  [id number]  LOCATION:  A306                          FACILITY:  [facility]  PHYSICIAN:  Kingsley Callander. Ouida Sills, MD       DATE OF BIRTH:  Aug 09, 1955  DATE OF ADMISSION:  12/27/2010 DATE OF DISCHARGE:  08/03/2012LH                              DISCHARGE SUMMARY   DISCHARGE DIAGNOSES: 1. Biliary colic. 2. History of biliary sphincterotomy 3. Primary biliary cirrhosis. 4. Hypothyroidism. 5. Uterine fibroid.  PROCEDURES:  Abdominopelvic CT.  HOSPITAL COURSE:  This patient is a 55 year old female who has presented with recurrent biliary colic.  She experienced upper abdominal pain and repeated vomiting for 2 days.  She was treated with antiemetics and analgesics along with IV fluids and observation through the day on Friday.  Her symptoms resolved.  Her transaminases revealed a drop in her SGOT, but a mild increase in her SGPT.  Her bilirubin though dropped from 2.3-1.0.  She was seen in consultation by Dr. Karilyn Cota.  Arrangements are being made for repeat ERCP by Dr. Opal Sidles in [place].  Vital signs have been stable.  She has been afebrile.  She has been able to tolerate clear liquids without difficulty.  She is therefore being discharged home on the evening of the 3rd.  If she has recurrent symptoms over the weekend she will need to return for further evaluation and treatment.  Her abdominal pelvic CT revealed a common bile duct of 12 mm.  No obvious stones were present.  Her condition at discharge is much improved.  She will have a bland diet.  She will have follow up with Dr. Opal Sidles next week.  DISCHARGE MEDICATIONS: 1. Wellbutrin XL 450 mg daily. 2. URSO Forte 500 mg three daily. 3. Synthroid 112 mcg daily. 4. Fluticasone nasal spray daily.     Kingsley Callander. Ouida Sills, MD     ROF/MEDQ  D:  12/28/2010  T:  12/28/2010  Job:  [id number]

## 2010-12-28 NOTE — Consult Note (Signed)
NAMEJESUSITA, Cynthia Donaldson                  ACCOUNT NO.:  [id number]  MEDICAL RECORD NO.:  [id number]  LOCATION:  A306                          FACILITY:  [facility]  PHYSICIAN:  Lionel December, M.D.    DATE OF BIRTH:  06-12-1955  DATE OF CONSULTATION:  12/27/2010 DATE OF DISCHARGE:  12/27/2010                                CONSULTATION   REASON FOR CONSULTATION:  Biliary colic, elevated bilirubin and transaminases.  HISTORY OF PRESENT ILLNESS:  The patient is a 55 year old Caucasian female who has history of sphincter Foley dysfunction and has undergone ERCP in April 2009, has been having intermittent right upper quadrant pain.  Family went on vacation last week and she was still having mild pain.  Yesterday morning after breakfast, she has developed pain in her right upper quadrant associated with nausea and vomiting.  She flew back to [place] last night and was sick all day.  She was brought to emergency room during the night.  She was evaluated by Dr. Colon Branch.  She will need to have elevated bilirubin and transaminases.  She had abdominopelvic CT which showed bile duct measuring 12-mm.  She was hospitalized for further evaluation.  This afternoon, she is pain free.  She has mild soreness in right upper quadrant region.  She has been keeping her liquids down.  She has noted orange color to urine today.  She experienced chills yesterday, but no fever.  She was not having any diarrhea, melena or rectal bleeding.  HOME MEDICATIONS: 1. Wellbutrin 450 mg p.o. daily. 2. Levothyroxine 100 mcg p.o. daily. 3. Urso 1.5 g daily. 4. Fluticasone nasal spray to each nostril daily.  Currently, she is on: 1. IV fluids. 2. Dilaudid 1 mg IV q.3 p.r.n. pain. 3. Zofran 4 mg IV q.6 p.r.n. 4. Pantoprazole 40 mg IV q.24 h.  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 This was confirmed by a liver biopsy which  she had at the time of cholecystectomy.  She had cholecystectomy for cholelithiasis.  Laparoscopic cholecystectomy in October 2006.  She has history of sphincter Foley dysfunction type 1.  She had ERCP in April 2009 with biliary sphincterotomy and biliary stenting and pancreatic stenting for prophylaxis.  She had biliary stent removed in July 2009.  She has continued to have sporadic episodes of pain, but nothing like she had yesterday.  She had EGD in October 2007.  She was noted to have a small sliding hiatal hernia.  She had patch of thick mucosa distal esophagus, but was negative for Barrett's.  She had screening colonoscopy also in October 2007, which was normal other than external hemorrhoids.  Stress disorder  She has hypothyroidism.  Allergic rhinosinusitis.  ALLERGIES:  To CODEINE.  FAMILY HISTORY:  Significant for colon carcinoma in a brother who was diagnosed with metastatic disease at 60 and died at 55.  She has a younger brother with lupus.  SOCIAL HISTORY:  She is married.  Her husband is Dr. Jena Gauss, gastroenterologist.  She has never smoked cigarettes and she may drink alcohol occasionally.  She is afebrile.  Conjunctivae  are pink.  Sclerae are nonicteric. Oropharyngeal mucosa is normal.  No neck masses or thyromegaly noted. Cardiac exam with regular rhythm.  Normal S1 and S2.  No murmur or gallop noted.  Lungs are clear to auscultation.  Abdomen is full.  Bowel sounds are normal.  On palpation, soft abdomen with mild tenderness below the right costal margin.  No hepatosplenomegaly noted.  No peripheral edema or clubbing noted.  Abdominopelvic CT reviewed with Dr. Ulyses Southward.  CBD is dilated measuring about 11-12 mm.  There maybe some sludge in the distal segment of bile duct, but no stones obvious.  LABORATORY DATA FROM ADMISSION:  WBC 9.7, H and H 14.8 and 43.0, platelet count is 238 K, total bilirubin 2.3, AP 276, SGOT 344, SGPT 247, total protein 8.2  with albumin of 4.0, calcium is 10.0, glucose 146, BUN 22 and creatinine 0.82.  Sodium 139, potassium 4.0 and chloride 99.  Abdominopelvic CT reviewed, the results as above.  ASSESSMENT:  The patient has biliary colic.  Her bilirubin and transaminases are elevated.  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  She is presently pain free. She does not appear to be toxic and she is afebrile.  She will need another ERCP.  I have discussed her condition with her husband Dr. Jena Gauss and we decided to arrange her to go [facility] for repeat exam, where she may also need biliary manometry.  RECOMMENDATIONS:  She will have her LFTs repeated and if her bilirubin is down, she should be able to go home.  She will be referred to Dr. Carla Drape of [facility] for ERCP in near future.  His office will contact the patient to make arrangements.  However, if she gets recurrent pain or fever over the weekend, she will need procedure on urgent basis.  We appreciate the opportunity to participate in [name]'s care.          ______________________________ Lionel December, M.D.     NR/MEDQ  D:  12/27/2010  T:  12/27/2010  Job:  [id number]

## 2010-12-28 NOTE — H&P (Signed)
Cynthia Donaldson, Cynthia Donaldson NO.:  [id number]  MEDICAL RECORD NO.:  [id number]  LOCATION:  A306                          FACILITY:  [facility]  PHYSICIAN:  Kingsley Callander. Ouida Sills, MD       DATE OF BIRTH:  Aug 12, 1955  DATE OF ADMISSION:  12/27/2010 DATE OF DISCHARGE:  08/03/2012LH                             HISTORY & PHYSICAL   CHIEF COMPLAINT:  Abdominal pain and vomiting.  HISTORY OF PRESENT ILLNESS:  This patient is a 55 year old white female who presented to the emergency room with a 2-day history of upper abdominal pain and repeated bouts of nausea and vomiting.  This began while she was on vacation.  Upon returning home to [facility], she had persistent pain and vomiting and was evaluated in the emergency room where she was found to have elevated liver enzymes.  She underwent a CT scan of the abdomen, which revealed a 12-mm common bile duct.  She was treated with intravenous antiemetics and analgesics.  She remains symptomatic and was therefore hospitalized for observation and Gastroenterology consultation.  The patient has a history of primary biliary cirrhosis and biliary stenting for papillary stenosis in 2009. She underwent an ERCP with sphincterotomy and biliary stent placement in April 2009 by Dr. Karilyn Cota.  She has had some intermittent episodes of right upper quadrant pain since that time.  She has had no recent fever. She denies any hematemesis.  Her last bowel movement had been 1 day prior to admission.  PAST MEDICAL HISTORY: 1. Primary biliary cirrhosis. 2. History of biliary sphincterotomy. 3. Hypothyroidism. 4. Laparoscopic cholecystectomy in 2006. 5. Laparoscopy x2. 6. Rectal fissure repair.  MEDICATIONS: 1. Urso Forte 500 mg three daily. 2. Wellbutrin XL 450 mg daily. 3. Synthroid 112 mcg daily. 4. Fluticasone nasal spray daily.  ALLERGIES:  CODEINE.  SOCIAL HISTORY:  She does not smoke or use drugs.  She drinks socially.  FAMILY HISTORY:  Brother had  bypass.  Brother has had colon cancer. Both parents have had heart disease.  REVIEW OF SYSTEMS:  Otherwise negative.  PHYSICAL EXAMINATION:  VITAL SIGNS:  Afebrile with stable vital signs. Blood pressures have been low normal WITH systolic pressures in the 90s. HEENT:  Eyes, nose and pharynx unremarkable.  There is no scleral icterus. NECK:  Reveals no JVD or thyromegaly. LUNGS:  Clear. HEART:  Regular at 76 beats per minute no murmurs. ABDOMEN:  Mildly tender in the right upper quadrant with no palpable hepatosplenomegaly. EXTREMITIES:  Normal pulses.  No cyanosis, clubbing, or edema. NEUROLOGIC:  Grossly intact. LYMPH NODES:  No cervical or supraclavicular enlargement.  LABORATORY DATA:  White count 9.7, hemoglobin 14.8, platelets 238,000. SGOT 344, SGPT 247, bilirubin 2.3, calcium 10.0, glucose 146, BUN 22, creatinine 0.82, potassium 4.0.  IMPRESSION/PLAN: 1. Recurrent biliary colic.  Her case has been discussed with her     gastroenterologist, Dr. Karilyn Cota.  She is being hospitalized for     observation.  She will be treated with Dilaudid and Zofran if     needed.  It appears she will require another endoscopic retrograde     cholangiopancreatography; however, this may need to be done at [facility]. 2. Hypothyroidism. 3. Primary biliary cirrhosis. 4. Status post cholecystectomy. 5. Uterine fibroid.     Kingsley Callander. Ouida Sills, MD     ROF/MEDQ  D:  12/28/2010  T:  12/28/2010  Job:  [id number]

## 2010-12-30 ENCOUNTER — Ambulatory Visit (HOSPITAL_COMMUNITY): Payer: Self-pay

## 2010-12-31 ENCOUNTER — Ambulatory Visit: Payer: Self-pay | Admitting: Orthopedic Surgery

## 2011-01-01 NOTE — Progress Notes (Signed)
Encounter addended by: Ree Shay, RN on: 01/01/2011 11:56 AM<BR>     Documentation filed: Charges VN

## 2011-01-23 NOTE — Progress Notes (Signed)
Encounter addended by: Clarene Critchley on: 01/23/2011  8:32 AM<BR>     Documentation filed: Flowsheet VN

## 2011-01-28 ENCOUNTER — Encounter: Payer: Self-pay | Admitting: Orthopedic Surgery

## 2011-01-28 ENCOUNTER — Ambulatory Visit (INDEPENDENT_AMBULATORY_CARE_PROVIDER_SITE_OTHER): Payer: 59 | Admitting: Orthopedic Surgery

## 2011-01-28 VITALS — Resp 18 | Ht 69.0 in | Wt 230.0 lb

## 2011-01-28 DIAGNOSIS — M75101 Unspecified rotator cuff tear or rupture of right shoulder, not specified as traumatic: Secondary | ICD-10-CM | POA: Insufficient documentation

## 2011-01-28 DIAGNOSIS — M67919 Unspecified disorder of synovium and tendon, unspecified shoulder: Secondary | ICD-10-CM

## 2011-01-28 MED ORDER — METHYLPREDNISOLONE ACETATE 40 MG/ML IJ SUSP: 40.0000 mg | Freq: Once | INTRAMUSCULAR | Status: AC

## 2011-01-28 NOTE — Patient Instructions (Addendum)
You have received a steroid shot. 15% of patients experience increased pain at the injection site with in the next 24 hours. This is best treated with ice and tylenol extra strength 2 tabs every 8 hours. If you are still having pain please call the office.   Start home exercise program

## 2011-01-28 NOTE — Progress Notes (Signed)
Chief complaint: right shoulder pain  HPI:(43) 55 year old female presents with a gradual onset of sharp 6/10 pain. Over the RIGHT shoulder associated with no trauma, but associated with forward elevation, occasional night pain, and internal rotation.  Review of systems is notable for cough, heartburn, nausea, vomiting, diarrhea, itching of the skin, numbness and tingling, joint, pain, stiffness, and muscle pain.  PFSH: (1)  Past Medical History  Diagnosis Date  . Hypothyroidism   . Cirrhosis, primary biliary   . Bile duct stenosis      Physical Exam(12) GENERAL: normal development   CDV: pulses are normal   Skin: normal  Lymph: nodes were not palpable/normal  Psychiatric: awake, alert and oriented  Neuro: normal sensation  MSK RIGHT shoulder and 1 Elevation to 120, active and 150 passive pain between 120 and 150 2 External rotation is normal. Internal rotation is limited. 3 Shoulder stability is normal. 4 Normal rotator cuff strength positive impingement sign 5 Negative apprehension sign 6 Normal reflexes 2+ bilaterally at the elbow and wrist  Imaging: Films today show normal glenohumeral anatomy with a type II acromion  Assessment: Rotator cuff syndrome, RIGHT shoulder    Plan: Injection RIGHT shoulder, home exercise program. Follow up as needed.  Shoulder Injection Procedure Note   Pre-operative Diagnosis: right  RC Syndrome  Post-operative Diagnosis: same  Indications: pain   Anesthesia: ethyl chloride   Procedure Details   Verbal consent was obtained for the procedure. The shoulder was prepped withalcohol and the skin was anesthetized. A 20 gauge needle was advanced into the subacromial space through posterior approach without difficulty  The space was then injected with 3 ml 1% lidocaine and 1 ml of depomedrol. The injection site was cleansed with isopropyl alcohol and a dressing was applied.  Complications:  None; patient tolerated the procedure  well.  A separate x-ray report AP, lateral, RIGHT shoulder. Diagnosis rotator cuff syndrome, shoulder pain. The findings, normal glenohumeral anatomy with type II acromion. No abnormal findings.  Impression normal RIGHT shoulder

## 2011-02-18 LAB — APTT: aPTT: 28

## 2011-02-18 LAB — COMPREHENSIVE METABOLIC PANEL
ALT: 38 — ABNORMAL HIGH
AST: 32
Albumin: 3.5
CO2: 26
Calcium: 9
Creatinine, Ser: 0.92
GFR calc Af Amer: >60
GFR calc non Af Amer: >60
Sodium: 141

## 2011-02-18 LAB — DIFFERENTIAL
Eosinophils Absolute: 0.3
Eosinophils Relative: 5
Lymphocytes Relative: 23
Lymphs Abs: 1.5
Monocytes Absolute: 0.4
Monocytes Relative: 6

## 2011-02-18 LAB — HEPATIC FUNCTION PANEL
ALT: 38 — ABNORMAL HIGH
ALT: 99 — ABNORMAL HIGH
AST: 33
Albumin: 3.5
Alkaline Phosphatase: 132 — ABNORMAL HIGH
Alkaline Phosphatase: 176 — ABNORMAL HIGH
Bilirubin, Direct: 0.1
Bilirubin, Direct: 0.3
Indirect Bilirubin: 0.4
Total Bilirubin: 0.4

## 2011-02-18 LAB — CBC
MCHC: 34.6
MCV: 86.2
Platelets: 233
RBC: 4.71
WBC: 6.7

## 2011-02-18 LAB — BILIRUBIN, FRACTIONATED(TOT/DIR/INDIR)
Bilirubin, Direct: 0.1
Indirect Bilirubin: 0.3

## 2011-02-18 LAB — URINALYSIS, ROUTINE W REFLEX MICROSCOPIC
Glucose, UA: NEGATIVE
Hgb urine dipstick: NEGATIVE
Ketones, ur: NEGATIVE
Protein, ur: NEGATIVE

## 2011-02-18 LAB — LIPASE, BLOOD: Lipase: 20

## 2011-02-18 LAB — PROTIME-INR: INR: 0.9

## 2011-02-21 LAB — HEMOGLOBIN AND HEMATOCRIT, BLOOD: Hemoglobin: 14

## 2011-02-21 LAB — HEPATIC FUNCTION PANEL
Alkaline Phosphatase: 152 — ABNORMAL HIGH
Indirect Bilirubin: 0.3
Total Protein: 6.7

## 2011-02-21 LAB — BASIC METABOLIC PANEL
BUN: 16
Chloride: 107
Creatinine, Ser: 0.72
GFR calc non Af Amer: >60

## 2011-04-03 ENCOUNTER — Encounter: Payer: Self-pay | Admitting: Orthopedic Surgery

## 2011-04-03 ENCOUNTER — Ambulatory Visit (INDEPENDENT_AMBULATORY_CARE_PROVIDER_SITE_OTHER): Payer: 59 | Admitting: Orthopedic Surgery

## 2011-04-03 ENCOUNTER — Ambulatory Visit: Payer: 59 | Admitting: Orthopedic Surgery

## 2011-04-03 VITALS — BP 114/80 | Ht 69.0 in | Wt 235.0 lb

## 2011-04-03 DIAGNOSIS — S43429A Sprain of unspecified rotator cuff capsule, initial encounter: Secondary | ICD-10-CM

## 2011-04-03 DIAGNOSIS — M75101 Unspecified rotator cuff tear or rupture of right shoulder, not specified as traumatic: Secondary | ICD-10-CM

## 2011-04-03 NOTE — Patient Instructions (Signed)
MRI right shoulder

## 2011-04-03 NOTE — Progress Notes (Signed)
RETURN  visit.  Status post injection RIGHT shoulder for impingement syndrome.  Patient's pain is worse.  She did not respond to injection, and anti-inflammatories with Aleve or ibuprofen or Tylenol. Her home exercise.  ROS: Neck pain none, numbness, tingling, none.  Recommend MRI RIGHT shoulder.  Today, we found weakness in the supraspinatus tendon, decreased range of motion pain at 90, forward elevation, and 70-80, abduction.  Possible PASTA  versus impingement syndrome.  Plan Occupational therapy

## 2011-04-04 ENCOUNTER — Other Ambulatory Visit: Payer: Self-pay | Admitting: Orthopedic Surgery

## 2011-04-04 ENCOUNTER — Telehealth: Payer: Self-pay | Admitting: Radiology

## 2011-04-04 DIAGNOSIS — M75100 Unspecified rotator cuff tear or rupture of unspecified shoulder, not specified as traumatic: Secondary | ICD-10-CM

## 2011-04-04 NOTE — Telephone Encounter (Signed)
Patient has MRI appointment at [facility] on 04-09-11 at 9:45. Dr. Romeo Apple will call with results and she will come back after therapy.

## 2011-04-07 ENCOUNTER — Ambulatory Visit: Payer: 59 | Admitting: Orthopedic Surgery

## 2011-04-07 ENCOUNTER — Ambulatory Visit (HOSPITAL_COMMUNITY): Payer: 59 | Admitting: Specialist

## 2011-04-08 ENCOUNTER — Ambulatory Visit (HOSPITAL_COMMUNITY)
Admission: RE | Admit: 2011-04-08 | Discharge: 2011-04-08 | Disposition: A | Discharge status: Home / Self Care | Payer: 59 | Source: Ambulatory Visit | Attending: Orthopedic Surgery | Admitting: Orthopedic Surgery

## 2011-04-08 ENCOUNTER — Telehealth: Payer: Self-pay | Admitting: Orthopedic Surgery

## 2011-04-08 DIAGNOSIS — M6281 Muscle weakness (generalized): Secondary | ICD-10-CM | POA: Insufficient documentation

## 2011-04-08 DIAGNOSIS — M25619 Stiffness of unspecified shoulder, not elsewhere classified: Secondary | ICD-10-CM | POA: Insufficient documentation

## 2011-04-08 DIAGNOSIS — M25519 Pain in unspecified shoulder: Secondary | ICD-10-CM | POA: Insufficient documentation

## 2011-04-08 DIAGNOSIS — M75101 Unspecified rotator cuff tear or rupture of right shoulder, not specified as traumatic: Secondary | ICD-10-CM

## 2011-04-08 DIAGNOSIS — IMO0001 Reserved for inherently not codable concepts without codable children: Secondary | ICD-10-CM | POA: Insufficient documentation

## 2011-04-08 NOTE — Telephone Encounter (Signed)
I spoke with Cynthia Donaldson to give her the MRI appointment time of 10:00 at [facility] on 04/09/11 , to register at 9:45.

## 2011-04-08 NOTE — Progress Notes (Signed)
Occupational Therapy Evaluation  Patient Details  Name: Cynthia Donaldson MRN: [id number] Date of Birth: 1955/11/24  Today's Date: 04/08/2011 Time: 4098-1191 Time Calculation (min): 34 min OT Evaluation [phone] 15' Manual Therapy [phone] 18' Visit#: 1  of 12   Re-eval: 05/06/11  Assessment Diagnosis: Right Shoulder Pain - Dr. Romeo Apple Next MD Visit: unknown Prior Therapy: none  Past Medical History:  Past Medical History  Diagnosis Date  . Hypothyroidism   . Cirrhosis, primary biliary   . Bile duct stenosis    Past Surgical History:  Past Surgical History  Procedure Date  . Cholecystectomy   . Tonsillectomy   . Myomectomy   . Ercp      X3    Subjective Symptoms/Limitations Symptoms: S:  I have had pain in my right shoulder for several months.  We thought it was bursitis, but it isnt.  I want to be able to get all of my movement back. Limitations: History:  Cynthia Donaldson has been experiencing pain and decreased mobility in her right shoulder for several months.  Her pain and mobility have worsened.  She has been referred to occupational therapy for evaluation and treatment. Pain Assessment Currently in Pain?: Yes Pain Score:   3 Pain Location: Shoulder Pain Orientation: Right Pain Type: Acute pain  Assessment RUE Assessment RUE Assessment:  (assessed in seated ext and int rot assessed in 90 abd) RUE AROM (degrees) Right Shoulder Flexion  0-170: 96 Degrees Right Shoulder ABduction 0-140: 82 Degrees Right Shoulder Internal Rotation  0-70: 24 Degrees Right Shoulder External Rotation  0-90: 86 Degrees RUE Strength Right Shoulder Flexion:  (4+/5) Right Shoulder ABduction:  (4+/5) Right Shoulder Internal Rotation:  (4+/5) Right Shoulder External Rotation:  (4+/5)  Exercise/Treatments  04/08/11 0700  Shoulder Exercises: Supine  Protraction PROM;5 reps  Horizontal ABduction PROM;5 reps  External Rotation PROM;5 reps  Internal Rotation PROM;5 reps  Flexion PROM;5 reps    ABduction PROM;5 reps   Manual Therapy Manual Therapy: Myofascial release Myofascial Release: MFR and manual stretching to right upper arm, scapular region, and trapezius to decrease pain and restrictions and increase painfree range of motion.  Occupational Therapy Assessment and Plan OT Assessment and Plan Clinical Impression Statement: A:  Patient presents with decreased AROM and strength and increased pain and restrictions causing decreased I with ADLs. Rehab Potential: Good OT Frequency: Min 2X/week OT Duration: 6 weeks OT Treatment/Interventions: Self-care/ADL training;Therapeutic exercise;Manual therapy;Therapeutic activities;Patient/family education;Other (comment) (modalities PRN and HEP for shoulder stretches) OT Plan: P:  Skilled OT intervention to decrease pain and restrictions and increase AROM and strength.  Treatment Plan:  MFR and manual stretching as outlined above.  Therapeutic Exercises:  Supine PROM, dowel, AROM.  seated elev, ext, ret, row.  ball stretches, wall wash, pulleys.   Goals Short Term Goals Time to Complete Short Term Goals: 3 weeks Short Term Goal 1: Patient will be I with HEP. Short Term Goal 2: Patient will increase PROM to WNL for increased ability to reach into overhead cabinets. Short Term Goal 3: Patient will decrease pain level to 2/10 in right shoulder while sleeping at night. Short Term Goal 4: Patient will decrease fascial restrictions from mod to min-mod in her right shoulder region. Long Term Goals Time to Complete Long Term Goals:  (6 weeks) Long Term Goal 1: Patient will return to prior level of I with all B/IADLS and leisure activities. Long Term Goal 2: Patient will increase right shoulder AROM to WNL for increased ability to reach into back  seat of her car. Long Term Goal 3: Patient will increase right shoulder strength to 5/5 for increased ability to put groceries on overhead shelves. Long Term Goal 4: Patient will decrease pain to 1/10  in her right shoulder during functional activities. Long Term Goal 5: Patient will decrease fascial restrictions to trace in her right shoulder. End of Session Patient Active Problem List  Diagnoses  . TRIGGER FINGER  . COLLES' FRACTURE, RIGHT WRIST  . Rotator cuff syndrome of right shoulder  . Rotator cuff tear, right  . Pain in joint, shoulder region  . Muscle weakness (generalized)   End of Session Activity Tolerance: Patient tolerated treatment well General Behavior During Session: [facility] for tasks performed Cognition: [facility] for tasks performed  Time Calculation Start Time: 0805 Stop Time: 0839 Time Calculation (min): 34 min  Shirlean Mylar, OTR/L  04/08/2011, 11:58 AM  Physician Documentation Your signature is required to indicate approval of the treatment plan as stated above.  Please sign and either send electronically or make a copy of this report for your files and return this physician signed original.  Please mark one 1.__approve of plan  2. ___approve of plan with the following conditions.   ______________________________                                                          _____________________ Physician Signature                                                                                                             Date

## 2011-04-09 ENCOUNTER — Ambulatory Visit (HOSPITAL_COMMUNITY)
Admission: RE | Admit: 2011-04-09 | Discharge: 2011-04-09 | Disposition: A | Discharge status: Home / Self Care | Payer: 59 | Source: Ambulatory Visit | Attending: Orthopedic Surgery | Admitting: Orthopedic Surgery

## 2011-04-09 ENCOUNTER — Ambulatory Visit (HOSPITAL_COMMUNITY)
Admission: RE | Admit: 2011-04-09 | Discharge: 2011-04-09 | Disposition: A | Discharge status: Home / Self Care | Payer: 59 | Source: Ambulatory Visit | Attending: Internal Medicine | Admitting: Internal Medicine

## 2011-04-09 DIAGNOSIS — M25519 Pain in unspecified shoulder: Secondary | ICD-10-CM

## 2011-04-09 DIAGNOSIS — M6281 Muscle weakness (generalized): Secondary | ICD-10-CM

## 2011-04-09 DIAGNOSIS — M249 Joint derangement, unspecified: Secondary | ICD-10-CM | POA: Insufficient documentation

## 2011-04-09 DIAGNOSIS — M75100 Unspecified rotator cuff tear or rupture of unspecified shoulder, not specified as traumatic: Secondary | ICD-10-CM

## 2011-04-09 DIAGNOSIS — IMO0002 Reserved for concepts with insufficient information to code with codable children: Secondary | ICD-10-CM | POA: Insufficient documentation

## 2011-04-09 NOTE — Progress Notes (Addendum)
Occupational Therapy Treatment  Patient Details  Name: Cynthia Donaldson MRN: [id number] Date of Birth: [birth year]  Today's Date: 04/09/2011 Time: 1308-6578 Time Calculation (min): 46 min Visit#: 2  of 12   Re-eval: 05/06/11 [facility]   [phone]  27' Therapeutic Exercise  [id number]  18'   Subjective Symptoms/Limitations Symptoms: S:  As long as I don't move it it is ok. Pain Assessment Currently in Pain?: Yes Pain Score: 0-No pain (with movement)   Exercise/Treatments Supine Protraction: PROM;AAROM;10 reps Horizontal ABduction: PROM;AAROM;10 reps External Rotation: PROM;AAROM;10 reps Internal Rotation: PROM;AAROM;10 reps Flexion: PROM;AAROM;10 reps ABduction: PROM;AAROM;10 reps Seated Elevation: AROM;10 reps Extension: AROM;10 reps Retraction: AROM;10 reps Row: AROM;10 reps Pulleys Flexion: 2 minutes ABduction: 2 minutes Therapy Ball Flexion: 20 reps ABduction: 20 reps ROM / Strengthening / Isometric Strengthening Wall Wash: 2'  Myofascial Release MFR and manual stretching to right upper arm, scapular region, and trapezius to decrease pain and restrictions and increase pain free range of motion   Occupational Therapy Assessment and Plan OT Assessment and Plan Clinical Impression Statement: A:  Added multiple new exercises which patient tolerated well and stated she felt it was much looser. Rehab Potential: Good OT Plan: P:  Add supine AROM   Goals Short Term Goals Time to Complete Short Term Goals: 3 weeks Short Term Goal 1: Patient will be I with HEP. Short Term Goal 2: Patient will increase PROM to WNL for increased ability to reach into overhead cabinets. Short Term Goal 3: Patient will decrease pain level to 2/10 in right shoulder while sleeping at night. Short Term Goal 4: Patient will decrease fascial restrictions from mod to min-mod in her right shoulder region. Long Term Goals Time to Complete Long Term Goals:  (6 weeks) Long Term Goal 1: Patient will  return to prior level of I with all B/IADLS and leisure activities. Long Term Goal 2: Patient will increase right shoulder AROM to WNL for increased ability to reach into back seat of her car. Long Term Goal 3: Patient will increase right shoulder strength to 5/5 for increased ability to put groceries on overhead shelves. Long Term Goal 4: Patient will decrease pain to 1/10 in her right shoulder during functional activities. Long Term Goal 5: Patient will decrease fascial restrictions to trace in her right shoulder. End of Session Patient Active Problem List  Diagnoses  . TRIGGER FINGER  . COLLES' FRACTURE, RIGHT WRIST  . Rotator cuff syndrome of right shoulder  . Rotator cuff tear, right  . Pain in joint, shoulder region  . Muscle weakness (generalized)   End of Session Activity Tolerance: Patient tolerated treatment well General Behavior During Session: [facility] for tasks performed Cognition: [facility] for tasks performed  Sabatino Williard L. Noralee Stain, COTA/L  04/09/2011, 2:27 PM

## 2011-04-15 ENCOUNTER — Ambulatory Visit (HOSPITAL_COMMUNITY)
Admission: RE | Admit: 2011-04-15 | Discharge: 2011-04-15 | Disposition: A | Discharge status: Home / Self Care | Payer: 59 | Source: Ambulatory Visit | Attending: Orthopedic Surgery | Admitting: Orthopedic Surgery

## 2011-04-15 ENCOUNTER — Encounter: Payer: Self-pay | Admitting: Orthopedic Surgery

## 2011-04-15 ENCOUNTER — Ambulatory Visit (INDEPENDENT_AMBULATORY_CARE_PROVIDER_SITE_OTHER): Payer: 59 | Admitting: Orthopedic Surgery

## 2011-04-15 VITALS — BP 120/60 | Ht 69.5 in | Wt 237.0 lb

## 2011-04-15 DIAGNOSIS — M6281 Muscle weakness (generalized): Secondary | ICD-10-CM

## 2011-04-15 DIAGNOSIS — M25519 Pain in unspecified shoulder: Secondary | ICD-10-CM

## 2011-04-15 DIAGNOSIS — M7500 Adhesive capsulitis of unspecified shoulder: Secondary | ICD-10-CM

## 2011-04-15 MED ORDER — TRAMADOL HCL 50 MG PO TABS: 50.0000 mg | ORAL_TABLET | Freq: Four times a day (QID) | ORAL | Status: DC | PRN

## 2011-04-15 NOTE — Progress Notes (Signed)
Occupational Therapy Treatment  Patient Details  Name: Cynthia Donaldson MRN: [id number] Date of Birth: December 21, 1955  Today's Date: 04/15/2011 Time: 4098-1191 Time Calculation (min): 41 min Manual Therapy [phone] 23' Therapeutic Exercises [phone] 17' Visit#: 3  of 12   Re-eval: 05/06/11    Subjective Symptoms/Limitations Symptoms: S:  Its feeling exactly the same.  I had an MRI and I go to the MD today. Pain Assessment Currently in Pain?: Yes Pain Score:   9 Pain Location: Shoulder Pain Orientation: Posterior Pain Type: Acute pain Pain Radiating Towards: with active reaching  Exercise/Treatments Supine Protraction: PROM;10 reps;AAROM;12 reps Horizontal ABduction: PROM;10 reps;AAROM;12 reps External Rotation: PROM;10 reps;AAROM;12 reps Internal Rotation: PROM;10 reps;AAROM;12 reps Flexion: PROM;10 reps;AAROM;12 reps ABduction: PROM;10 reps Seated Elevation: AROM;15 reps Extension: AROM;15 reps Retraction: AROM;15 reps Row: AROM;15 reps Pulleys Flexion: 2 minutes ABduction: 2 minutes Therapy Ball Flexion: 25 reps ABduction: 25 reps ROM / Strengthening / Isometric Strengthening Wall Wash: low wall wash 2'   Manual Therapy Manual Therapy: Myofascial release Myofascial Release: MFR and manual stretching to right upper arm, scapular region, and trapezius to decrease pain and restrictions and increase pain free range of motion.  [phone]  Occupational Therapy Assessment and Plan OT Assessment and Plan Clinical Impression Statement: A:  Continues to have increased pain in posterior shoulder despite manual intervetnion. OT Plan: P:  Continue per MD recommendations (has MD appointment today at 430).   Goals Short Term Goals Time to Complete Short Term Goals: 3 weeks Short Term Goal 1: Patient will be I with HEP. Short Term Goal 1 Progress: Progressing toward goal Short Term Goal 2: Patient will increase PROM to WNL for increased ability to reach into overhead  cabinets. Short Term Goal 2 Progress: Progressing toward goal Short Term Goal 3: Patient will decrease pain level to 2/10 in right shoulder while sleeping at night. Short Term Goal 3 Progress: Progressing toward goal Short Term Goal 4: Patient will decrease fascial restrictions from mod to min-mod in her right shoulder region. Short Term Goal 4 Progress: Progressing toward goal Long Term Goals Time to Complete Long Term Goals:  (6 weeks) Long Term Goal 1: Patient will return to prior level of I with all B/IADLS and leisure activities. Long Term Goal 1 Progress: Progressing toward goal Long Term Goal 2: Patient will increase right shoulder AROM to WNL for increased ability to reach into back seat of her car. Long Term Goal 2 Progress: Progressing toward goal Long Term Goal 3: Patient will increase right shoulder strength to 5/5 for increased ability to put groceries on overhead shelves. Long Term Goal 3 Progress: Progressing toward goal Long Term Goal 4: Patient will decrease pain to 1/10 in her right shoulder during functional activities. Long Term Goal 4 Progress: Progressing toward goal Long Term Goal 5: Patient will decrease fascial restrictions to trace in her right shoulder. Long Term Goal 5 Progress: Progressing toward goal End of Session Patient Active Problem List  Diagnoses  . TRIGGER FINGER  . COLLES' FRACTURE, RIGHT WRIST  . Rotator cuff syndrome of right shoulder  . Rotator cuff tear, right  . Pain in joint, shoulder region  . Muscle weakness (generalized)   End of Session Activity Tolerance: Patient tolerated treatment well General Behavior During Session: [facility] for tasks performed Cognition: [facility] for tasks performed   Shirlean Mylar, OTR/L  04/15/2011, 8:48 AM

## 2011-04-15 NOTE — Patient Instructions (Signed)
Frozen shoulder   Small rotator cuff tear   REC:  physical therapy

## 2011-04-15 NOTE — Progress Notes (Signed)
The patient is in therapy for her shoulder pain and has had an MRI, which showed a partial thickness cuff tear of 0.5 cm she also has frozen shoulder type findings on MRI.  Recommended continued physical therapy. I gave her the frozen shoulder handout from [facility] [employer] and I would recommend she return in. We also talked about potential for manipulation and possible fracture of his humerus which is very rare, but she should know about that. Should we get to that point.  Returns 2 months check shoulder

## 2011-04-16 ENCOUNTER — Ambulatory Visit (HOSPITAL_COMMUNITY)
Admission: RE | Admit: 2011-04-16 | Discharge: 2011-04-16 | Disposition: A | Discharge status: Home / Self Care | Payer: 59 | Source: Ambulatory Visit | Attending: Internal Medicine | Admitting: Internal Medicine

## 2011-04-16 DIAGNOSIS — M25519 Pain in unspecified shoulder: Secondary | ICD-10-CM

## 2011-04-16 DIAGNOSIS — M6281 Muscle weakness (generalized): Secondary | ICD-10-CM

## 2011-04-16 NOTE — Progress Notes (Signed)
Occupational Therapy Treatment  Patient Details  Name: Cynthia Donaldson MRN: [id number] Date of Birth: Jun 23, 1955  Today's Date: 04/16/2011 Time: 1914-7829 Time Calculation (min): 59 min Manual Therapy [phone] 24' Therapeutic Exercises [phone] 40' Visit#: 4  of 12   Re-eval: 05/06/11    Subjective Symptoms/Limitations Symptoms: S:  I went to the MD yesterday and he diagnosed me with frozen shoulder. Pain Assessment Currently in Pain?: Yes Pain Score:   1 Pain Orientation: Posterior;Right Pain Type: Acute pain  Exercise/Treatments   04/16/11 0700 Shoulder Exercises: Supine Protraction PROM;AROM;10 reps;AAROM;15 reps Horizontal ABduction PROM;AROM;10 reps;AAROM;15 reps External Rotation PROM;AROM;10 reps;AAROM;15 reps Internal Rotation PROM;AROM;10 reps;AAROM;15 reps Flexion PROM;AROM;10 reps;AAROM;15 reps ABduction PROM;AROM;10 reps;AAROM;15 reps Shoulder Exercises: Seated Elevation AROM;15 reps Extension AROM;15 reps Retraction AROM;15 reps Row AROM;15 reps Shoulder Exercises: Pulleys Flexion 3 minutes ABduction 3 minutes Shoulder Exercises: Therapy Ball Flexion 25 reps ABduction 25 reps Shoulder Exercises: ROM/Strengthening Wall Wash low wall wash 3' Thumb Tacks 1' Prot/Ret//Elev/Dep 1'  Manual Therapy Manual Therapy: Myofascial release Myofascial Release: MFR and manual stretching to right upper arm, scapular region, and trapezius to decrease pain and restrictions and increase pain free range of motion.  [phone]  Occupational Therapy Assessment and Plan OT Assessment and Plan Clinical Impression Statement: A:  Added AROM in supine. OT Plan: P:  Add seated AROM and attempt ball circles, add UBE second visit next week.   Goals Short Term Goals Time to Complete Short Term Goals: 3 weeks Short Term Goal 1: Patient will be I with HEP. Short Term Goal 2: Patient will increase PROM to WNL for increased ability to reach into overhead cabinets. Short Term Goal  3: Patient will decrease pain level to 2/10 in right shoulder while sleeping at night. Short Term Goal 4: Patient will decrease fascial restrictions from mod to min-mod in her right shoulder region. Long Term Goals Time to Complete Long Term Goals:  (6 weeks) Long Term Goal 1: Patient will return to prior level of I with all B/IADLS and leisure activities. Long Term Goal 2: Patient will increase right shoulder AROM to WNL for increased ability to reach into back seat of her car. Long Term Goal 3: Patient will increase right shoulder strength to 5/5 for increased ability to put groceries on overhead shelves. Long Term Goal 4: Patient will decrease pain to 1/10 in her right shoulder during functional activities. Long Term Goal 5: Patient will decrease fascial restrictions to trace in her right shoulder. End of Session Patient Active Problem List  Diagnoses  . TRIGGER FINGER  . COLLES' FRACTURE, RIGHT WRIST  . Rotator cuff syndrome of right shoulder  . Rotator cuff tear, right  . Pain in joint, shoulder region  . Muscle weakness (generalized)   End of Session Activity Tolerance: Patient tolerated treatment well General Behavior During Session: [facility] for tasks performed Cognition: [facility] for tasks performed   Shirlean Mylar, OTR/L  04/16/2011, 11:02 AM

## 2011-04-21 ENCOUNTER — Ambulatory Visit (HOSPITAL_COMMUNITY)
Admission: RE | Admit: 2011-04-21 | Discharge: 2011-04-21 | Disposition: A | Discharge status: Home / Self Care | Payer: 59 | Source: Ambulatory Visit | Attending: Orthopedic Surgery | Admitting: Orthopedic Surgery

## 2011-04-21 DIAGNOSIS — M25519 Pain in unspecified shoulder: Secondary | ICD-10-CM

## 2011-04-21 DIAGNOSIS — M6281 Muscle weakness (generalized): Secondary | ICD-10-CM

## 2011-04-21 NOTE — Progress Notes (Addendum)
Occupational Therapy Treatment  Patient Details  Name: Cynthia Donaldson MRN: [id number] Date of Birth: 1956/05/12  Today's Date: 04/21/2011 Time: 6578-4696 Time Calculation (min): 54 min Visit#: 5  of 12   Re-eval: 05/06/11 [facility]  [phone] 13' Therapeutic Exercise  [phone]  31'  Subjective Symptoms/Limitations Symptoms: S:  I redid the tile in the kitchen. It is just sore. Pain Assessment Currently in Pain?: No/denies  Exercise/Treatments Supine Protraction: PROM;10 reps;AROM;12 reps Horizontal ABduction: PROM;10 reps;AROM;12 reps External Rotation: PROM;10 reps;AROM;12 reps Internal Rotation: PROM;10 reps;AROM;12 reps Flexion: PROM;10 reps;AROM;12 reps ABduction: PROM;10 reps;AROM;12 reps Seated Elevation: AROM;15 reps Extension: AROM;15 reps Retraction: AROM;15 reps Row: AROM;15 reps Pulleys Flexion: 3 minutes ABduction: 3 minutes Therapy Ball Flexion: 25 reps ABduction: 25 reps Right/Left: 5 reps ROM / Strengthening / Isometric Strengthening Wall Wash: 4' Thumb Tacks: 1' Prot/Ret//Elev/Dep: 1'  Myofascial Release MFR and manual stretching to right upper arm, scapular region, and trapezius to decrease pain and restrictions and increase painfree range of motion   Occupational Therapy Assessment and Plan OT Assessment and Plan Clinical Impression Statement: A:  Added ball circles which patient stated felt good. Rehab Potential: Good OT Plan: P:  Add UBE   Goals Short Term Goals Time to Complete Short Term Goals: 3 weeks Short Term Goal 1: Patient will be I with HEP. Short Term Goal 2: Patient will increase PROM to WNL for increased ability to reach into overhead cabinets. Short Term Goal 3: Patient will decrease pain level to 2/10 in right shoulder while sleeping at night. Short Term Goal 4: Patient will decrease fascial restrictions from mod to min-mod in her right shoulder region. Long Term Goals Time to Complete Long Term Goals:  (6 weeks) Long  Term Goal 1: Patient will return to prior level of I with all B/IADLS and leisure activities. Long Term Goal 2: Patient will increase right shoulder AROM to WNL for increased ability to reach into back seat of her car. Long Term Goal 3: Patient will increase right shoulder strength to 5/5 for increased ability to put groceries on overhead shelves. Long Term Goal 4: Patient will decrease pain to 1/10 in her right shoulder during functional activities. Long Term Goal 5: Patient will decrease fascial restrictions to trace in her right shoulder. End of Session Patient Active Problem List  Diagnoses  . TRIGGER FINGER  . COLLES' FRACTURE, RIGHT WRIST  . Rotator cuff syndrome of right shoulder  . Rotator cuff tear, right  . Pain in joint, shoulder region  . Muscle weakness (generalized)   End of Session Activity Tolerance: Patient tolerated treatment well General Behavior During Session: [facility] for tasks performed Cognition: [facility] for tasks performed    Mavryk Pino L. Renso Swett, COTA/L  04/21/2011, 11:40 AM

## 2011-04-24 ENCOUNTER — Ambulatory Visit (HOSPITAL_COMMUNITY)
Admission: RE | Admit: 2011-04-24 | Discharge: 2011-04-24 | Disposition: A | Discharge status: Home / Self Care | Payer: 59 | Source: Ambulatory Visit | Attending: Orthopedic Surgery | Admitting: Orthopedic Surgery

## 2011-04-24 DIAGNOSIS — M25519 Pain in unspecified shoulder: Secondary | ICD-10-CM

## 2011-04-24 DIAGNOSIS — M6281 Muscle weakness (generalized): Secondary | ICD-10-CM

## 2011-04-24 NOTE — Progress Notes (Signed)
Occupational Therapy Treatment  Patient Details  Name: TANYIKA BARROS MRN: [id number] Date of Birth: May 26, 1956  Today's Date: 04/24/2011 Time: 5621-3086 Time Calculation (min): 63 min Manual Therapy [phone] 23' Therapeutic Exercises [phone] 37'  Visit#: 6  of 12   Re-eval: 05/06/11    Subjective Symptoms/Limitations Symptoms: S:  I exercised it alot yesterday. Pain Assessment Currently in Pain?: Yes Pain Score:   3 Pain Location: Shoulder Pain Orientation: Right Pain Type: Acute pain  O:  Exercise/Treatments Supine Protraction: PROM;10 reps;AROM;15 reps Horizontal ABduction: PROM;10 reps;AROM;15 reps External Rotation: PROM;10 reps;AROM;15 reps Internal Rotation: PROM;10 reps;AROM;15 reps Flexion: PROM;10 reps;AROM;15 reps ABduction: PROM;10 reps;AROM;15 reps Seated Elevation: AROM;15 reps Extension: AROM;15 reps Retraction: AROM;15 reps Row: AROM;15 reps Protraction: AROM;10 reps Horizontal ABduction: AROM;10 reps External Rotation: AROM;10 reps Internal Rotation: AROM;10 reps Flexion: AROM;10 reps Abduction: AROM;10 reps Pulleys Flexion:  (dc) ABduction:  (dc) Therapy Ball Flexion: 25 reps ABduction: 25 reps Right/Left: 10 reps ROM / Strengthening / Isometric Strengthening UBE (Upper Arm Bike): 2' and 2' 1.0 Wall Wash: 4' Thumb Tacks: 1' "W" Arms: x 10 X to V Arms: x 10 Prot/Ret//Elev/Dep: 1'   Manual Therapy Manual Therapy: Myofascial release Myofascial Release: MFR and manual stretching to right upper arm, scapular region, and trapezius to decrease pain and restrictions and increase pain free range of motion.  [phone]  Occupational Therapy Assessment and Plan OT Assessment and Plan Clinical Impression Statement: A:  Added UBE, seated AROM, and several other exercises. OT Plan: P:  Add 1# to supine exercises.   Goals Short Term Goals Time to Complete Short Term Goals: 3 weeks Short Term Goal 1: Patient will be I with HEP. Short Term Goal 2:  Patient will increase PROM to WNL for increased ability to reach into overhead cabinets. Short Term Goal 3: Patient will decrease pain level to 2/10 in right shoulder while sleeping at night. Short Term Goal 4: Patient will decrease fascial restrictions from mod to min-mod in her right shoulder region. Long Term Goals Time to Complete Long Term Goals:  (6 weeks) Long Term Goal 1: Patient will return to prior level of I with all B/IADLS and leisure activities. Long Term Goal 2: Patient will increase right shoulder AROM to WNL for increased ability to reach into back seat of her car. Long Term Goal 3: Patient will increase right shoulder strength to 5/5 for increased ability to put groceries on overhead shelves. Long Term Goal 4: Patient will decrease pain to 1/10 in her right shoulder during functional activities. Long Term Goal 5: Patient will decrease fascial restrictions to trace in her right shoulder. End of Session Patient Active Problem List  Diagnoses  . TRIGGER FINGER  . COLLES' FRACTURE, RIGHT WRIST  . Rotator cuff syndrome of right shoulder  . Rotator cuff tear, right  . Pain in joint, shoulder region  . Muscle weakness (generalized)   End of Session Activity Tolerance: Patient tolerated treatment well General Behavior During Session: [facility] for tasks performed Cognition: [facility] for tasks performed   Shirlean Mylar, OTR/L  04/24/2011, 9:15 AM

## 2011-04-28 ENCOUNTER — Ambulatory Visit (HOSPITAL_COMMUNITY): Payer: 59 | Admitting: Specialist

## 2011-04-30 ENCOUNTER — Ambulatory Visit (HOSPITAL_COMMUNITY)
Admission: RE | Admit: 2011-04-30 | Discharge: 2011-04-30 | Disposition: A | Discharge status: Home / Self Care | Payer: 59 | Source: Ambulatory Visit | Attending: Internal Medicine | Admitting: Internal Medicine

## 2011-04-30 DIAGNOSIS — IMO0001 Reserved for inherently not codable concepts without codable children: Secondary | ICD-10-CM | POA: Insufficient documentation

## 2011-04-30 DIAGNOSIS — M25619 Stiffness of unspecified shoulder, not elsewhere classified: Secondary | ICD-10-CM | POA: Insufficient documentation

## 2011-04-30 DIAGNOSIS — M25519 Pain in unspecified shoulder: Secondary | ICD-10-CM | POA: Insufficient documentation

## 2011-04-30 DIAGNOSIS — M6281 Muscle weakness (generalized): Secondary | ICD-10-CM | POA: Insufficient documentation

## 2011-04-30 NOTE — Progress Notes (Addendum)
Occupational Therapy Treatment  Patient Details  Name: Cynthia Donaldson MRN: [id number] Date of Birth: Jun 22, 1955  Today's Date: 04/30/2011 Time: 8469-6295 Time Calculation (min): 66 min Visit#: 7  of 12   Re-eval: 05/06/11 Manual Therapy [phone]  34' Therapeutic Exercise [phone]  31'    Subjective Symptoms/Limitations Symptoms: S:  I have had this headache that runs up the back of my neck and nothing seems to touch it. Pain Assessment Currently in Pain?: No/denies  Exercise/Treatments Supine Protraction: PROM;Strengthening;10 reps;Weights Protraction Weight (lbs): 1# Horizontal ABduction: PROM;Strengthening;10 reps;Weights Horizontal ABduction Weight (lbs): 1# External Rotation: PROM;Strengthening;10 reps;Weights External Rotation Weight (lbs): 1# Internal Rotation: PROM;Strengthening;10 reps;Weights Internal Rotation Weight (lbs): 1# Flexion: PROM;Strengthening;10 reps;Weights Shoulder Flexion Weight (lbs): 1# ABduction: PROM;Strengthening;10 reps;Weights Shoulder ABduction Weight (lbs): 1# Seated Extension: Theraband;10 reps Theraband Level (Shoulder Extension): Level 2 (Red) Retraction: Theraband;10 reps Theraband Level (Shoulder Retraction): Level 2 (Red) Row: Theraband;10 reps Theraband Level (Shoulder Row): Level 2 (Red) Protraction: AROM;12 reps Horizontal ABduction: AROM;10 reps External Rotation: AROM;10 reps Internal Rotation: AROM;10 reps Flexion: AROM;10 reps Abduction: AROM;10 reps Therapy Ball Flexion: 25 reps ABduction: 25 reps Right/Left: 10 reps ROM / Strengthening / Isometric Strengthening UBE (Upper Arm Bike): 3' forward 3' backwards 1.5 Wall Wash: 3' with 1# Thumb Tacks: 1' "W" Arms: x 10 X to V Arms: x 10 Prot/Ret//Elev/Dep: 1'  Manual Therapy Manual Therapy: Myofascial release Myofascial Release: MFR and manual stretching to right upper arm, scapular region and trapezius to decrease pain and restrictions and increase pain free range of  motion and help decrease radiating pain from trapezius causing increase headaches.  Occupational Therapy Assessment and Plan OT Assessment and Plan Clinical Impression Statement: A:  Added 1# to supine exercises.  Decrease c/o headachepain after manual.. Rehab Potential: Good OT Plan: P:  Increase reps with supine and tband.   Goals Short Term Goals Time to Complete Short Term Goals: 3 weeks Short Term Goal 1: Patient will be I with HEP. Short Term Goal 2: Patient will increase PROM to WNL for increased ability to reach into overhead cabinets. Short Term Goal 3: Patient will decrease pain level to 2/10 in right shoulder while sleeping at night. Short Term Goal 4: Patient will decrease fascial restrictions from mod to min-mod in her right shoulder region. Long Term Goals Time to Complete Long Term Goals:  (6 weeks) Long Term Goal 1: Patient will return to prior level of I with all B/IADLS and leisure activities. Long Term Goal 2: Patient will increase right shoulder AROM to WNL for increased ability to reach into back seat of her car. Long Term Goal 3: Patient will increase right shoulder strength to 5/5 for increased ability to put groceries on overhead shelves. Long Term Goal 4: Patient will decrease pain to 1/10 in her right shoulder during functional activities. Long Term Goal 5: Patient will decrease fascial restrictions to trace in her right shoulder. End of Session Patient Active Problem List  Diagnoses  . TRIGGER FINGER  . COLLES' FRACTURE, RIGHT WRIST  . Rotator cuff syndrome of right shoulder  . Rotator cuff tear, right  . Pain in joint, shoulder region  . Muscle weakness (generalized)   End of Session Activity Tolerance: Patient tolerated treatment well General Behavior During Session: [facility] for tasks performed Cognition: [facility] for tasks performed   Kilan Banfill L. Marris Frontera, COTA/L  04/30/2011, 9:31 AM

## 2011-05-05 ENCOUNTER — Ambulatory Visit (HOSPITAL_COMMUNITY)
Admission: RE | Admit: 2011-05-05 | Discharge: 2011-05-05 | Disposition: A | Discharge status: Home / Self Care | Payer: 59 | Source: Ambulatory Visit | Attending: Internal Medicine | Admitting: Internal Medicine

## 2011-05-05 DIAGNOSIS — M6281 Muscle weakness (generalized): Secondary | ICD-10-CM

## 2011-05-05 DIAGNOSIS — M25519 Pain in unspecified shoulder: Secondary | ICD-10-CM

## 2011-05-05 NOTE — Progress Notes (Signed)
Occupational Therapy Treatment  Patient Details  Name: Cynthia Donaldson MRN: [id number] Date of Birth: 1956-03-14  Today's Date: 05/05/2011 Time: 4098-1191 Time Calculation (min): 44 min Manual Therapy [phone] 22' Therapeutic Exercises 829-850 21' Visit#: 8  of 12   Re-eval: 05/06/11    Subjective Symptoms/Limitations Symptoms: S:  I just got back from [place]. Pain Assessment Currently in Pain?: Yes Pain Score:   2 Pain Location: Shoulder Pain Orientation: Right Pain Type: Acute pain   Exercise/Treatments Supine Protraction: PROM;10 reps;Strengthening;12 reps Protraction Weight (lbs): 1# Horizontal ABduction: PROM;10 reps;Strengthening;15 reps Horizontal ABduction Weight (lbs): 1# External Rotation: PROM;10 reps;Strengthening;12 reps External Rotation Weight (lbs): 1# Internal Rotation: PROM;10 reps;Strengthening;12 reps Internal Rotation Weight (lbs): 1# Flexion: PROM;10 reps;Strengthening;12 reps Shoulder Flexion Weight (lbs): 1# ABduction: PROM;10 reps;Strengthening;12 reps Shoulder ABduction Weight (lbs): 1# Seated Protraction: Strengthening;10 reps Protraction Weight (lbs): 1 Horizontal ABduction: Strengthening;10 reps Horizontal ABduction Weight (lbs): 1 External Rotation: Strengthening;10 reps External Rotation Weight (lbs): 1 Internal Rotation: Strengthening;10 reps Internal Rotation Weight (lbs): 1 Flexion: Strengthening;10 reps Flexion Weight (lbs): 1 Abduction: Strengthening;10 reps ABduction Weight (lbs): 1 Therapy Ball Flexion: 25 reps ABduction: 25 reps Right/Left: 10 reps ROM / Strengthening / Isometric Strengthening UBE (Upper Arm Bike): 3' forward 3' backwards 1.5 Wall Wash: 4' with 1# Thumb Tacks: 1' "W" Arms: 10 wtih 1# X to V Arms: 10 with 1# Prot/Ret//Elev/Dep: 1'   Manual Therapy Manual Therapy: Myofascial release Myofascial Release: MFR and manual stretching to right upper arm, scapular region, and trapezius region to decrease  pain and restrictions and increase pain free mobility.  [phone]  Occupational Therapy Assessment and Plan OT Assessment and Plan Clinical Impression Statement: A:  Added 1# to seated exercises. OT Plan: P:  REASSESS   Goals Short Term Goals Time to Complete Short Term Goals: 3 weeks Short Term Goal 1: Patient will be I with HEP. Short Term Goal 1 Progress: Progressing toward goal Short Term Goal 2: Patient will increase PROM to WNL for increased ability to reach into overhead cabinets. Short Term Goal 2 Progress: Progressing toward goal Short Term Goal 3: Patient will decrease pain level to 2/10 in right shoulder while sleeping at night. Short Term Goal 3 Progress: Progressing toward goal Short Term Goal 4: Patient will decrease fascial restrictions from mod to min-mod in her right shoulder region. Short Term Goal 4 Progress: Progressing toward goal Long Term Goals Time to Complete Long Term Goals:  (6 weeks) Long Term Goal 1: Patient will return to prior level of I with all B/IADLS and leisure activities. Long Term Goal 1 Progress: Progressing toward goal Long Term Goal 2: Patient will increase right shoulder AROM to WNL for increased ability to reach into back seat of her car. Long Term Goal 2 Progress: Progressing toward goal Long Term Goal 3: Patient will increase right shoulder strength to 5/5 for increased ability to put groceries on overhead shelves. Long Term Goal 3 Progress: Progressing toward goal Long Term Goal 4: Patient will decrease pain to 1/10 in her right shoulder during functional activities. Long Term Goal 4 Progress: Progressing toward goal Long Term Goal 5: Patient will decrease fascial restrictions to trace in her right shoulder. Long Term Goal 5 Progress: Progressing toward goal End of Session Patient Active Problem List  Diagnoses  . TRIGGER FINGER  . COLLES' FRACTURE, RIGHT WRIST  . Rotator cuff syndrome of right shoulder  . Rotator cuff tear, right  .  Pain in joint, shoulder region  . Muscle weakness (  generalized)   End of Session Activity Tolerance: Patient tolerated treatment well General Behavior During Session: [facility] for tasks performed Cognition: [facility] for tasks performed   Shirlean Mylar, OTR/L  05/05/2011, 9:11 AM

## 2011-05-07 ENCOUNTER — Ambulatory Visit (HOSPITAL_COMMUNITY)
Admission: RE | Admit: 2011-05-07 | Discharge: 2011-05-07 | Disposition: A | Discharge status: Home / Self Care | Payer: 59 | Source: Ambulatory Visit | Attending: Internal Medicine | Admitting: Internal Medicine

## 2011-05-07 DIAGNOSIS — M6281 Muscle weakness (generalized): Secondary | ICD-10-CM

## 2011-05-07 DIAGNOSIS — M25519 Pain in unspecified shoulder: Secondary | ICD-10-CM

## 2011-05-07 NOTE — Progress Notes (Signed)
Occupational Therapy Treatment  Patient Details  Name: Cynthia Donaldson MRN: [id number] Date of Birth: 06/04/1955  Today's Date: 05/07/2011 Time: 4098-1191 Time Calculation (min): 62 min Visit#: 9  of 12   Re-eval: 05/14/11 Manual Therapy [phone] 23' Therapeutic Exercise 917-955  38'    Subjective Symptoms/Limitations Symptoms: S:  I does not hurt right now. Pain Assessment Currently in Pain?: No/denies  Exercise/Treatments Supine Protraction: PROM;10 reps;Strengthening;12 reps Protraction Weight (lbs): 2# Horizontal ABduction: PROM;10 reps;Strengthening;15 reps Horizontal ABduction Weight (lbs): 2# External Rotation: PROM;10 reps;Strengthening;12 reps External Rotation Weight (lbs): 2# Internal Rotation: PROM;10 reps;Strengthening;12 reps Internal Rotation Weight (lbs): 2# Flexion: PROM;10 reps;Strengthening;12 reps Shoulder Flexion Weight (lbs): 2# ABduction: PROM;10 reps;Strengthening;12 reps Shoulder ABduction Weight (lbs): 2# Seated Extension: Theraband;12 reps Theraband Level (Shoulder Extension): Level 3 (Green) Retraction: Theraband;12 reps Theraband Level (Shoulder Retraction): Level 3 (Green) Row: Theraband;10 reps Theraband Level (Shoulder Row): Level 3 (Green) Protraction: Strengthening;10 reps Protraction Weight (lbs): 2# Horizontal ABduction: Strengthening;10 reps Horizontal ABduction Weight (lbs): 2# External Rotation: Strengthening;10 reps External Rotation Weight (lbs): 2# Internal Rotation: Strengthening;10 reps Internal Rotation Weight (lbs): 2# Flexion: Strengthening;10 reps Flexion Weight (lbs): 2# Abduction: Strengthening;10 reps ABduction Weight (lbs): 2# Therapy Ball Flexion: 25 reps ABduction: 25 reps Right/Left: 10 reps ROM / Strengthening / Isometric Strengthening UBE (Upper Arm Bike): 3' forward 3' backwards 2.0 Wall Wash: 4' with 1# Thumb Tacks: 1' "W" Arms: 10 wtih 1# X to V Arms: 10 with 1# Prot/Ret//Elev/Dep: 1'     Manual  Therapy Manual Therapy: Myofascial release Myofascial Release: MFR and manual stretching to right upper arm, scapular region, and trapezius to decrease pain and restrictions and increase painfree range of motion  Occupational Therapy Assessment and Plan OT Assessment and Plan OT Plan: P Increase strengthening repes.   Goals Short Term Goals Time to Complete Short Term Goals: 3 weeks Short Term Goal 1: Patient will be I with HEP. Short Term Goal 2: Patient will increase PROM to WNL for increased ability to reach into overhead cabinets. Short Term Goal 3: Patient will decrease pain level to 2/10 in right shoulder while sleeping at night. Short Term Goal 4: Patient will decrease fascial restrictions from mod to min-mod in her right shoulder region. Long Term Goals Time to Complete Long Term Goals:  (6 weeks) Long Term Goal 1: Patient will return to prior level of I with all B/IADLS and leisure activities. Long Term Goal 2: Patient will increase right shoulder AROM to WNL for increased ability to reach into back seat of her car. Long Term Goal 3: Patient will increase right shoulder strength to 5/5 for increased ability to put groceries on overhead shelves. Long Term Goal 4: Patient will decrease pain to 1/10 in her right shoulder during functional activities. Long Term Goal 5: Patient will decrease fascial restrictions to trace in her right shoulder. End of Session Patient Active Problem List  Diagnoses  . TRIGGER FINGER  . COLLES' FRACTURE, RIGHT WRIST  . Rotator cuff syndrome of right shoulder  . Rotator cuff tear, right  . Pain in joint, shoulder region  . Muscle weakness (generalized)     Patient tolerated treatment well,.   Tivis Wherry L. Noralee Stain, COTA/L  05/07/2011, 12:21 PM

## 2011-05-12 ENCOUNTER — Ambulatory Visit (HOSPITAL_COMMUNITY)
Admission: RE | Admit: 2011-05-12 | Discharge: 2011-05-12 | Disposition: A | Discharge status: Home / Self Care | Payer: 59 | Source: Ambulatory Visit | Attending: Internal Medicine | Admitting: Internal Medicine

## 2011-05-12 DIAGNOSIS — M6281 Muscle weakness (generalized): Secondary | ICD-10-CM

## 2011-05-12 DIAGNOSIS — M25519 Pain in unspecified shoulder: Secondary | ICD-10-CM

## 2011-05-12 NOTE — Progress Notes (Signed)
Occupational Therapy Treatment  Patient Details  Name: Cynthia Donaldson MRN: [id number] Date of Birth: 1956/01/14  Today's Date: 05/12/2011 Time: 4098-1191 Time Calculation (min): 53 min Manual Therapy [phone] 19' Therapeutic Exercises [phone] 68' Visit#: 10  of 20   Re-eval: 06/09/11    Subjective Symptoms/Limitations Symptoms: S:  It's still really difficult to get comfortable when I am sleeping at night. Pain Assessment Currently in Pain?: No/denies Pain Score: 0-No pain  O:  Exercise/Treatments Supine Protraction: PROM;10 reps Horizontal ABduction: PROM;10 reps External Rotation: PROM;10 reps Internal Rotation: PROM;10 reps Flexion: PROM;10 reps ABduction: PROM;10 reps Seated Protraction: Strengthening;12 reps Protraction Weight (lbs): 2# Horizontal ABduction: Strengthening;12 reps Horizontal ABduction Weight (lbs): 2# External Rotation: Strengthening;12 reps External Rotation Weight (lbs): 2# Internal Rotation: Strengthening;12 reps Internal Rotation Weight (lbs): 2# Flexion: Strengthening;12 reps Flexion Weight (lbs): 2# Abduction: Strengthening;12 reps ABduction Weight (lbs): 2# Therapy Ball Flexion:  (dc) ABduction:  (dc) Right/Left: 10 reps ROM / Strengthening / Isometric Strengthening UBE (Upper Arm Bike): 3' forward 3' backwards 2.5 Cybex Press: 1 plate;10 reps Cybex Row: 1 plate;10 reps Wall Wash: 2' with 2# Thumb Tacks: 1' "W" Arms: 10 with 2# X to V Arms: 10 with 2# Prot/Ret//Elev/Dep: 1'   Manual Therapy Manual Therapy: Myofascial release Myofascial Release: MFR and manual stretching to right upper arm, scapular region, and trapezius to decrease pain and restrictions and increase painfree range of motion.  [phone]  Occupational Therapy Assessment and Plan OT Assessment and Plan Clinical Impression Statement: A:  Please refer to monthly progress note. OT Plan: P:  Continue to increase internal rotation to wfl for adls.   Goals Short Term  Goals Time to Complete Short Term Goals: 3 weeks Short Term Goal 1: Patient will be I with HEP. Short Term Goal 1 Progress: Met Short Term Goal 2: Patient will increase PROM to WNL for increased ability to reach into overhead cabinets. Short Term Goal 2 Progress: Met Short Term Goal 3: Patient will decrease pain level to 2/10 in right shoulder while sleeping at night. Short Term Goal 3 Progress: Met Short Term Goal 4: Patient will decrease fascial restrictions from mod to min-mod in her right shoulder region. Short Term Goal 4 Progress: Met Long Term Goals Time to Complete Long Term Goals:  (6 weeks) Long Term Goal 1: Patient will return to prior level of I with all B/IADLS and leisure activities. Long Term Goal 1 Progress: Progressing toward goal Long Term Goal 2: Patient will increase right shoulder AROM to WNL for increased ability to reach into back seat of her car. Long Term Goal 2 Progress: Progressing toward goal Long Term Goal 3: Patient will increase right shoulder strength to 5/5 for increased ability to put groceries on overhead shelves. Long Term Goal 3 Progress: Met Long Term Goal 4: Patient will decrease pain to 1/10 in her right shoulder during functional activities. Long Term Goal 4 Progress: Progressing toward goal Long Term Goal 5: Patient will decrease fascial restrictions to trace in her right shoulder. Long Term Goal 5 Progress: Progressing toward goal End of Session Patient Active Problem List  Diagnoses  . TRIGGER FINGER  . COLLES' FRACTURE, RIGHT WRIST  . Rotator cuff syndrome of right shoulder  . Rotator cuff tear, right  . Pain in joint, shoulder region  . Muscle weakness (generalized)   End of Session Activity Tolerance: Patient tolerated treatment well General Behavior During Session: [facility] for tasks performed Cognition: [facility] for tasks performed   Shirlean Mylar, OTR/L  05/12/2011, 10:34 AM

## 2011-05-16 ENCOUNTER — Ambulatory Visit (HOSPITAL_COMMUNITY): Payer: 59 | Admitting: Occupational Therapy

## 2011-06-03 ENCOUNTER — Ambulatory Visit (HOSPITAL_COMMUNITY)
Admission: RE | Admit: 2011-06-03 | Discharge: 2011-06-03 | Disposition: A | Discharge status: Home / Self Care | Payer: 59 | Source: Ambulatory Visit | Attending: Internal Medicine | Admitting: Internal Medicine

## 2011-06-03 DIAGNOSIS — IMO0001 Reserved for inherently not codable concepts without codable children: Secondary | ICD-10-CM | POA: Insufficient documentation

## 2011-06-03 DIAGNOSIS — M6281 Muscle weakness (generalized): Secondary | ICD-10-CM | POA: Insufficient documentation

## 2011-06-03 DIAGNOSIS — M25619 Stiffness of unspecified shoulder, not elsewhere classified: Secondary | ICD-10-CM | POA: Insufficient documentation

## 2011-06-03 DIAGNOSIS — M25519 Pain in unspecified shoulder: Secondary | ICD-10-CM | POA: Insufficient documentation

## 2011-06-03 NOTE — Progress Notes (Signed)
Occupational Therapy Treatment  Patient Details  Name: Cynthia Donaldson MRN: [id number] Date of Birth: Feb 10, 1956  Today's Date: 06/03/2011 Time: 4098-1191 Manual Therapy [phone] 20' Therapeutic Exercise [id number]  40' Time Calculation (min): 60 min  Visit#: 11  of 20   Re-eval: 06/09/11    Subjective Symptoms/Limitations Symptoms: S: "It hurts pretty good when it hurts" Difficulty sleeping at night." "It's a tad bit better" 8/10 with reaching. Repetition: Decreases Symptoms Pain Assessment Currently in Pain?: No/denies Pain Score: 0-No pain Pain Location: Shoulder Pain Orientation: Right Pain Type: Acute pain Pain Radiating Towards: with active reach  Precautions/Restrictions     Exercise/Treatments Supine Protraction: PROM;Strengthening;AROM;10 reps Protraction Weight (lbs): 2# Horizontal ABduction: PROM;AROM;Strengthening;10 reps Horizontal ABduction Weight (lbs): 2# External Rotation: PROM;AROM;Strengthening;10 reps External Rotation Weight (lbs): 2# Internal Rotation: PROM;AROM;Strengthening;10 reps Internal Rotation Weight (lbs): 2# Flexion: PROM;AROM;Strengthening;10 reps Shoulder Flexion Weight (lbs): 2# ABduction: PROM;AROM;Strengthening;10 reps Shoulder ABduction Weight (lbs): 2# Seated Elevation: AROM;15 reps Extension: Theraband;12 reps Theraband Level (Shoulder Extension): Level 3 (Green) Retraction: Theraband;12 reps Theraband Level (Shoulder Retraction): Level 3 (Green) Row: Theraband;10 reps Theraband Level (Shoulder Row): Level 3 (Green) Protraction: Strengthening;12 reps Protraction Weight (lbs): 2# Horizontal ABduction: Strengthening;12 reps Horizontal ABduction Weight (lbs): 2# External Rotation: Strengthening;12 reps External Rotation Weight (lbs): 2# Internal Rotation: Strengthening;12 reps Internal Rotation Weight (lbs): 2# Flexion: Strengthening;12 reps Flexion Weight (lbs): 2# Abduction: Strengthening;12 reps ABduction Weight (lbs):  2# Therapy Ball Right/Left: 10 reps ROM / Strengthening / Isometric Strengthening UBE (Upper Arm Bike): 3' and 3' 3.0 Cybex Press: 1 plate;10 reps Cybex Row: 1 plate;10 reps Wall Wash: 2' with 2# Thumb Tacks: 1' "W" Arms: 10 with 2# X to V Arms: 10 with 2# Prot/Ret//Elev/Dep: 1'   Manual Therapy Manual Therapy: Joint mobilization Joint Mobilization: Shoulder and scapular mobilization with soft tissue mob as well to shoulder.  Occupational Therapy Assessment and Plan OT Assessment and Plan Clinical Impression Statement: A: Patient progressing nicely with very little pain. Continue to increase strength in shoulder. Rehab Potential: Good OT Frequency: Min 2X/week OT Duration: 6 weeks OT Treatment/Interventions: Self-care/ADL training;Therapeutic exercise;Manual therapy;Therapeutic activities;Patient/family education OT Plan: P: Continue with work on IR ER for functional ADL's   Goals    Problem List Patient Active Problem List  Diagnoses  . TRIGGER FINGER  . COLLES' FRACTURE, RIGHT WRIST  . Rotator cuff syndrome of right shoulder  . Rotator cuff tear, right  . Pain in joint, shoulder region  . Muscle weakness (generalized)    End of Session Activity Tolerance: Patient tolerated treatment well General Behavior During Session: [facility] for tasks performed Cognition: [facility] for tasks performed   Lisa Roca OTR/L 06/03/2011, 12:03 PM

## 2011-06-06 ENCOUNTER — Ambulatory Visit (HOSPITAL_COMMUNITY)
Admission: RE | Admit: 2011-06-06 | Discharge: 2011-06-06 | Disposition: A | Discharge status: Home / Self Care | Payer: 59 | Source: Ambulatory Visit | Attending: Internal Medicine | Admitting: Internal Medicine

## 2011-06-06 DIAGNOSIS — M6281 Muscle weakness (generalized): Secondary | ICD-10-CM

## 2011-06-06 DIAGNOSIS — M25519 Pain in unspecified shoulder: Secondary | ICD-10-CM

## 2011-06-06 NOTE — Progress Notes (Signed)
Occupational Therapy Treatment  Patient Details  Name: Cynthia Donaldson MRN: [id number] Date of Birth: Jan 24, 1956  Today's Date: 06/06/2011 Time: 8413-2440 Time Calculation (min): 53 min  Visit#: 12  of 20   Re-eval: 06/09/11 Manual therapy [phone] 17' Therapeutic Exercise 900-929 29'    Subjective Symptoms/Limitations Symptoms: S:  It hurts in more directions than it did before Pain Assessment Currently in Pain?: Yes Pain Score:   7 Pain Location: Shoulder Pain Orientation: Right Pain Type: Acute pain Pain Radiating Towards: With movement  Precautions/Restrictions     Exercise/Treatments Supine Protraction: PROM;10 reps;Strengthening;12 reps Protraction Weight (lbs): 2# Horizontal ABduction: PROM;10 reps;Strengthening;12 reps Horizontal ABduction Weight (lbs): 2# External Rotation: PROM;10 reps;Strengthening;12 reps External Rotation Weight (lbs): 2# Internal Rotation: PROM;10 reps;Strengthening;12 reps Internal Rotation Weight (lbs): 2# Flexion: PROM;10 reps;Strengthening;12 reps Shoulder Flexion Weight (lbs): 2# Seated Extension: Theraband;15 reps Theraband Level (Shoulder Extension): Level 3 (Green) Retraction: Theraband;15 reps Theraband Level (Shoulder Retraction): Level 3 (Green) Row: Theraband;15 reps Theraband Level (Shoulder Row): Level 3 (Green) Protraction: Strengthening;15 reps Protraction Weight (lbs): 2# Horizontal ABduction: Strengthening;15 reps Horizontal ABduction Weight (lbs): 2# External Rotation: Strengthening;Theraband;15 reps Theraband Level (Shoulder External Rotation): Level 3 (Green) External Rotation Weight (lbs): 2# Internal Rotation: Strengthening;Theraband;15 reps Theraband Level (Shoulder Internal Rotation): Level 3 (Green) Internal Rotation Weight (lbs): 2# Flexion: Strengthening;12 reps Flexion Weight (lbs): 2# Abduction: Strengthening;12 reps ABduction Weight (lbs): 2# Therapy Ball Right/Left: 10 reps ROM / Strengthening /  Isometric Strengthening UBE (Upper Arm Bike): 3' and 3' 3.0 Cybex Press:  (hold) Cybex Row:  (hold) Wall Wash: 2' with 2# Thumb Tacks: 1' "W" Arms: 10 with 2# X to V Arms: 10 with 2# Prot/Ret//Elev/Dep: 1'     Manual Therapy Manual Therapy: Myofascial release Myofascial Release: MFR and manual stretching to right upper arm, scapular region, and trapezius to decrease pain and restrictions and increase painfree range of motion  Occupational Therapy Assessment and Plan OT Assessment and Plan Clinical Impression Statement: A:  Hold on cybex today secondary to pain. Rehab Potential: Good OT Plan: P:  REASSESS   Goals Short Term Goals Time to Complete Short Term Goals: 3 weeks Short Term Goal 1: Patient will be I with HEP. Short Term Goal 2: Patient will increase PROM to WNL for increased ability to reach into overhead cabinets. Short Term Goal 3: Patient will decrease pain level to 2/10 in right shoulder while sleeping at night. Short Term Goal 4: Patient will decrease fascial restrictions from mod to min-mod in her right shoulder region. Long Term Goals Time to Complete Long Term Goals:  (6 weeks) Long Term Goal 1: Patient will return to prior level of I with all B/IADLS and leisure activities. Long Term Goal 2: Patient will increase right shoulder AROM to WNL for increased ability to reach into back seat of her car. Long Term Goal 3: Patient will increase right shoulder strength to 5/5 for increased ability to put groceries on overhead shelves. Long Term Goal 4: Patient will decrease pain to 1/10 in her right shoulder during functional activities. Long Term Goal 5: Patient will decrease fascial restrictions to trace in her right shoulder.  Problem List Patient Active Problem List  Diagnoses  . TRIGGER FINGER  . COLLES' FRACTURE, RIGHT WRIST  . Rotator cuff syndrome of right shoulder  . Rotator cuff tear, right  . Pain in joint, shoulder region  . Muscle weakness (generalized)     End of Session Activity Tolerance: Patient tolerated treatment well General Behavior During Session:  WFL for tasks performed Cognition: [facility] for tasks performed OT Plan of Care OT Home Exercise Plan: Green tband for scapular strengthening with written handout. Consulted and Agree with Plan of Care: Patient   [name], COTA/L  06/06/2011, 9:37 AM

## 2011-06-11 ENCOUNTER — Ambulatory Visit (HOSPITAL_COMMUNITY)
Admission: RE | Admit: 2011-06-11 | Discharge: 2011-06-11 | Disposition: A | Discharge status: Home / Self Care | Payer: 59 | Source: Ambulatory Visit | Attending: Internal Medicine | Admitting: Internal Medicine

## 2011-06-11 DIAGNOSIS — M6281 Muscle weakness (generalized): Secondary | ICD-10-CM

## 2011-06-11 DIAGNOSIS — M25519 Pain in unspecified shoulder: Secondary | ICD-10-CM

## 2011-06-11 NOTE — Progress Notes (Signed)
Occupational Therapy Treatment  Patient Details  Name: Cynthia Donaldson MRN: [id number] Date of Birth: [birth year]  Today's Date: 06/11/2011 Time: 8295-6213  Manual Therapy  [phone] 25' Therapeutic Exercise 1010-1030 20' Moist Heat 1000-1010 10' Time Calculation (min): 55 min  Visit#: 13  of 20   Re-eval: 06/09/11    Subjective Symptoms/Limitations Symptoms: S: I can do less with ist than I could two weeks ago.  Pain Assessment Currently in Pain?: Yes Pain Score:   2  Precautions/Restrictions     Exercise/Treatments Supine Protraction: PROM Horizontal ABduction: PROM;10 reps External Rotation: PROM;10 reps Internal Rotation: PROM;10 reps Flexion: PROM;10 reps ABduction: PROM;10 reps Seated Elevation: AROM;15 reps Extension: Theraband;15 reps Theraband Level (Shoulder Extension): Level 2 (Red) Retraction: Theraband;15 reps Theraband Level (Shoulder Retraction): Level 2 (Red) Row: Theraband;15 reps Theraband Level (Shoulder Row): Level 2 (Red) Protraction: Strengthening;15 reps Protraction Weight (lbs):  (1#) Prone    SiROM / Strengthening / Isometric Strengthening UBE (Upper Arm Bike): 3' and 3' 3.0  Modalities Modalities: Moist Heat Manual Therapy Manual Therapy: Massage Joint Mobilization: Moist heat to neck for 10 minutes followed by light MFR and massage to neck area to decrease restrictions and increase AROM with decreased pain. Moist Heat Therapy Number Minutes Moist Heat: 10 Minutes Moist Heat Location: Other (comment) (neck posterior and bilatearl sides)  Occupational Therapy Assessment and Plan OT Assessment and Plan Clinical Impression Statement: A: Held back with weights nad exercises due to pain. No pain by end of session and decreased gaurding. Rehab Potential: Excellent OT Frequency: Min 2X/week OT Duration: 6 weeks OT Treatment/Interventions: Self-care/ADL training;Therapeutic exercise;Therapeutic activities;Manual therapy;Patient/family  education OT Plan: P: Need to Reassess   Goals    Problem List Patient Active Problem List  Diagnoses  . TRIGGER FINGER  . COLLES' FRACTURE, RIGHT WRIST  . Rotator cuff syndrome of right shoulder  . Rotator cuff tear, right  . Pain in joint, shoulder region  . Muscle weakness (generalized)    End of Session Activity Tolerance: Patient tolerated treatment well General Behavior During Session: [facility] for tasks performed   Lisa Roca OTR/L 06/11/2011, 1:05 PM

## 2011-06-12 ENCOUNTER — Telehealth (HOSPITAL_COMMUNITY): Payer: Self-pay

## 2011-06-12 ENCOUNTER — Ambulatory Visit (HOSPITAL_COMMUNITY): Payer: 59 | Admitting: Occupational Therapy

## 2011-06-17 ENCOUNTER — Ambulatory Visit (INDEPENDENT_AMBULATORY_CARE_PROVIDER_SITE_OTHER): Payer: 59 | Admitting: Orthopedic Surgery

## 2011-06-17 ENCOUNTER — Encounter: Payer: Self-pay | Admitting: Orthopedic Surgery

## 2011-06-17 VITALS — BP 120/82 | Ht 69.0 in | Wt 240.0 lb

## 2011-06-17 DIAGNOSIS — M75101 Unspecified rotator cuff tear or rupture of right shoulder, not specified as traumatic: Secondary | ICD-10-CM

## 2011-06-17 DIAGNOSIS — M719 Bursopathy, unspecified: Secondary | ICD-10-CM

## 2011-06-17 DIAGNOSIS — M67919 Unspecified disorder of synovium and tendon, unspecified shoulder: Secondary | ICD-10-CM

## 2011-06-17 NOTE — Progress Notes (Signed)
Patient ID: Cynthia Donaldson, female   DOB: 1955-07-18, 56 y.o.   MRN: [id number]  Routine follow up visit.  Diagnosis adhesive capsulitis. Diagnosis rotator cuff syndrome. Diagnosis partial rotator cuff tear.  Status post physical therapy.  The patient is doing very well progressing normally until a new therapist put her arm in abduction. External rotation. She felt pain and has had pain at the deltoid stents. She still cannot fully internally rotate.  Exam shows excellent supraspinatus strength. No pain in the joint line with abduction external rotation.  Impression diagnosis as stated.  Irritation from abduction. External rotation.  Recommend max-freeze, home exercises. Follow up as needed. She did not want injection.  1. Rotator cuff tendinopathy with a very small partial width,  partial thickness tear of the anterior and far lateral  supraspinatus.  2. Subacromial/subdeltoid bursitis.  3. Acromioclavicular osteoarthritis.  4. Findings compatible with adhesive capsulitis.

## 2011-06-17 NOTE — Patient Instructions (Addendum)
Start max freeze 3 times a day   Continue exercises at home

## 2011-09-23 NOTE — Progress Notes (Signed)
Patient with stomatitis likely viral. Please call in to [facility].  Magic Mouthwash with Lidocaine (benadryl 12.5mg /41mL, viscous lidocaine 2%, Mylanta). Take 5cc po swish and SPIT QID prn. 150cc bottle with no refills. Please ask pharmacy to make 1:1:1 ratio of three components.    Rx called in per [facility] and verified with pharmacist at [facility].

## 2012-04-12 ENCOUNTER — Encounter (INDEPENDENT_AMBULATORY_CARE_PROVIDER_SITE_OTHER): Payer: Self-pay

## 2012-06-30 ENCOUNTER — Emergency Department (HOSPITAL_COMMUNITY)
Admission: EM | Admit: 2012-06-30 | Discharge: 2012-06-30 | Disposition: A | Discharge status: Home / Self Care | Payer: 59 | Attending: Emergency Medicine | Admitting: Emergency Medicine

## 2012-06-30 ENCOUNTER — Encounter (HOSPITAL_COMMUNITY): Payer: Self-pay

## 2012-06-30 ENCOUNTER — Emergency Department (HOSPITAL_COMMUNITY): Payer: 59

## 2012-06-30 DIAGNOSIS — R0789 Other chest pain: Secondary | ICD-10-CM | POA: Insufficient documentation

## 2012-06-30 DIAGNOSIS — R079 Chest pain, unspecified: Secondary | ICD-10-CM

## 2012-06-30 DIAGNOSIS — Z8719 Personal history of other diseases of the digestive system: Secondary | ICD-10-CM | POA: Insufficient documentation

## 2012-06-30 DIAGNOSIS — R0602 Shortness of breath: Secondary | ICD-10-CM | POA: Insufficient documentation

## 2012-06-30 DIAGNOSIS — E039 Hypothyroidism, unspecified: Secondary | ICD-10-CM | POA: Insufficient documentation

## 2012-06-30 DIAGNOSIS — Z79899 Other long term (current) drug therapy: Secondary | ICD-10-CM | POA: Insufficient documentation

## 2012-06-30 DIAGNOSIS — IMO0002 Reserved for concepts with insufficient information to code with codable children: Secondary | ICD-10-CM | POA: Insufficient documentation

## 2012-06-30 LAB — COMPREHENSIVE METABOLIC PANEL
ALT: 45 U/L — ABNORMAL HIGH (ref 0–35)
AST: 24 U/L (ref 0–37)
Albumin: 3.8 g/dL (ref 3.5–5.2)
Alkaline Phosphatase: 132 U/L — ABNORMAL HIGH (ref 39–117)
Glucose, Bld: 148 mg/dL — ABNORMAL HIGH (ref 70–99)
Potassium: 3.8 mEq/L (ref 3.5–5.1)
Sodium: 142 mEq/L (ref 135–145)
Total Protein: 7.4 g/dL (ref 6.0–8.3)

## 2012-06-30 LAB — CBC WITH DIFFERENTIAL/PLATELET
Basophils Absolute: 0 10*3/uL (ref 0.0–0.1)
Basophils Relative: 0 % (ref 0–1)
Eosinophils Absolute: 0.2 10*3/uL (ref 0.0–0.7)
Lymphs Abs: 1.2 10*3/uL (ref 0.7–4.0)
MCH: 30.5 pg (ref 26.0–34.0)
Neutrophils Relative %: 79 % — ABNORMAL HIGH (ref 43–77)
Platelets: 233 10*3/uL (ref 150–400)
RBC: 4.69 MIL/uL (ref 3.87–5.11)
RDW: 13 % (ref 11.5–15.5)

## 2012-06-30 LAB — LIPASE, BLOOD: Lipase: 27 U/L (ref 11–59)

## 2012-06-30 MED ORDER — NITROGLYCERIN 0.4 MG SL SUBL
0.4000 mg | SUBLINGUAL_TABLET | SUBLINGUAL | Status: DC | PRN
  Administered 2012-06-30: 0.4 mg via SUBLINGUAL
  Filled 2012-06-30: qty 25

## 2012-06-30 MED ORDER — ONDANSETRON HCL 4 MG/2ML IJ SOLN
INTRAMUSCULAR | Status: AC
  Administered 2012-06-30: 4 mg
  Filled 2012-06-30: qty 2

## 2012-06-30 MED ORDER — MORPHINE SULFATE 2 MG/ML IJ SOLN
2.0000 mg | Freq: Once | INTRAMUSCULAR | Status: AC
  Administered 2012-06-30: 2 mg via INTRAVENOUS
  Filled 2012-06-30: qty 1

## 2012-06-30 MED ORDER — ASPIRIN 81 MG PO CHEW
324.0000 mg | CHEWABLE_TABLET | Freq: Once | ORAL | Status: AC
  Administered 2012-06-30: 324 mg via ORAL
  Filled 2012-06-30: qty 4

## 2012-06-30 NOTE — ED Notes (Signed)
Squeezing type mid sternal chest pain that started approx 2130 tonight, states is hard to take a deep breath and also pain worse with some movement

## 2012-06-30 NOTE — ED Provider Notes (Signed)
History     CSN: [id number]  Arrival date & time 06/30/12  0002   First MD Initiated Contact with Patient 06/30/12 0010      Chief Complaint  Patient presents with  . Chest Pain    (Consider location/radiation/quality/duration/timing/severity/associated sxs/prior treatment) HPI Comments: Patient presents with several hour history of chest discomfort that started while going out to feed the dog.  It has been constant without radiation to the arm or jaw, nausea, or diaphoresis.  She feels like she is having a hard time taking a deep breath.  No fevers, chills, or cough.  She denies any cardiac history and has never had a stress test or heart cath.  She denies any regular exercise routine but has not experienced any recent exertional symptoms.     She has the risk factors of CAD with both parents, but at an advanced age.  She does not smoke, is not diabetic, and not under treatment for cholesterol or hypertension.    Patient is a 57 y.o. female presenting with chest pain. The history is provided by the patient.  Chest Pain Episode onset: 9:30 tonight. Duration of episode(s) is 3 hours. Chest pain occurs constantly. The chest pain is unchanged. Associated with: going outside to feed dog. The severity of the pain is moderate. The quality of the pain is described as tightness. The pain does not radiate. Chest pain is worsened by certain positions and deep breathing (movement, palpation). Primary symptoms include shortness of breath. Pertinent negatives for primary symptoms include no fever, no cough, no palpitations, no nausea and no vomiting. She tried nothing for the symptoms.     Past Medical History  Diagnosis Date  . Hypothyroidism   . Cirrhosis, primary biliary   . Bile duct stenosis     Past Surgical History  Procedure Date  . Cholecystectomy   . Tonsillectomy   . Myomectomy   . Ercp      X3    Family History  Problem Relation Age of Onset  . Heart disease    . Arthritis     . Cancer      History  Substance Use Topics  . Smoking status: Never Smoker   . Smokeless tobacco: Not on file  . Alcohol Use: Yes    OB History    Grav Para Term Preterm Abortions TAB SAB Ect Mult Living                  Review of Systems  Constitutional: Negative for fever.  Respiratory: Positive for shortness of breath. Negative for cough.   Cardiovascular: Positive for chest pain. Negative for palpitations.  Gastrointestinal: Negative for nausea and vomiting.  All other systems reviewed and are negative.    Allergies  Codeine  Home Medications   Current Outpatient Rx  Name  Route  Sig  Dispense  Refill  . BUPROPION HCL ER (XL) 150 MG PO TB24   Oral   Take 450 mg by mouth daily.           Marland Kitchen LEVOTHYROXINE SODIUM 100 MCG PO TABS   Oral   Take 100 mcg by mouth daily.           Marland Kitchen URSODIOL 500 MG PO TABS   Oral   Take 1,500 mg by mouth daily.          . WELLBUTRIN PO   Oral   Take by mouth.          Marland Kitchen FLUTICASONE PROPIONATE  50 MCG/ACT NA SUSP   Nasal   Place 2 sprays into the nose daily.           Marland Kitchen SYNTHROID PO   Oral   Take by mouth.             BP 152/97  Pulse 86  Temp 98.1 F (36.7 C) (Oral)  Resp 20  Ht 5\' 9"  (1.753 m)  Wt 225 lb (102.059 kg)  BMI 33.23 kg/m2  SpO2 94%  Physical Exam  Nursing note and vitals reviewed. Constitutional: She is oriented to person, place, and time. She appears well-developed and well-nourished. No distress.  HENT:  Head: Normocephalic and atraumatic.  Mouth/Throat: Oropharynx is clear and moist.  Neck: Normal range of motion. Neck supple.  Cardiovascular: Normal rate and regular rhythm.   No murmur heard. Pulmonary/Chest: Effort normal and breath sounds normal. No respiratory distress. She has no wheezes.       There is ttp to the anterior chest wall.  This seems to reproduce her symptoms.   Abdominal: Soft. Bowel sounds are normal.  Musculoskeletal: Normal range of motion. She exhibits no  edema.  Neurological: She is alert and oriented to person, place, and time.  Skin: Skin is warm and dry. She is not diaphoretic.    ED Course  Procedures (including critical care time)   Labs Reviewed  CBC WITH DIFFERENTIAL  COMPREHENSIVE METABOLIC PANEL  TROPONIN I  D-DIMER, QUANTITATIVE   No results found.   No diagnosis found.   Date: 06/30/2012  Rate: 84  Rhythm: normal sinus rhythm  QRS Axis: normal  Intervals: normal  ST/T Wave abnormalities: normal  Conduction Disutrbances:none  Narrative Interpretation:   Old EKG Reviewed: none available    MDM  The patient presents here with chest discomfort that is atypical for cardiac pain.  The symptoms are reproducible with palpation of the anterior chest wall and workup is unremarkable after three hours of consistent symptoms.  The D-Dimer is negative, combined with low suspicion for PE.  She is feeling better with the meds given.  I discussed the results of the test with the patient and her husband who is a physician.  They would prefer discharge with follow up with pcp as opposed to admission for further rule out and stress testing.  I believe this to be a reasonable approach.  She has an appointment with Dr. Ouida Sills in two days and will discuss whether a stress test is appropriate at that time if she is not improving.  She has been advised of the reasons for return.          Geoffery Lyons, MD 06/30/12 [phone]

## 2012-06-30 NOTE — ED Notes (Signed)
Pt became nauseated and hypotensive after nitro. 4 mg Zofran given IV

## 2012-07-05 ENCOUNTER — Other Ambulatory Visit (HOSPITAL_COMMUNITY): Payer: Self-pay | Admitting: Internal Medicine

## 2012-07-05 DIAGNOSIS — Z139 Encounter for screening, unspecified: Secondary | ICD-10-CM

## 2012-07-13 ENCOUNTER — Ambulatory Visit (HOSPITAL_COMMUNITY): Payer: 59

## 2012-07-13 ENCOUNTER — Ambulatory Visit (HOSPITAL_COMMUNITY)
Admission: RE | Admit: 2012-07-13 | Discharge: 2012-07-13 | Disposition: A | Discharge status: Home / Self Care | Payer: 59 | Source: Ambulatory Visit | Attending: Internal Medicine | Admitting: Internal Medicine

## 2012-07-13 DIAGNOSIS — Z139 Encounter for screening, unspecified: Secondary | ICD-10-CM

## 2012-07-13 DIAGNOSIS — Z1231 Encounter for screening mammogram for malignant neoplasm of breast: Secondary | ICD-10-CM | POA: Insufficient documentation

## 2012-07-15 ENCOUNTER — Encounter (INDEPENDENT_AMBULATORY_CARE_PROVIDER_SITE_OTHER): Payer: Self-pay

## 2012-07-16 ENCOUNTER — Ambulatory Visit: Payer: 59 | Admitting: Internal Medicine

## 2012-11-02 ENCOUNTER — Ambulatory Visit (INDEPENDENT_AMBULATORY_CARE_PROVIDER_SITE_OTHER): Payer: 59

## 2012-11-02 ENCOUNTER — Ambulatory Visit (INDEPENDENT_AMBULATORY_CARE_PROVIDER_SITE_OTHER): Payer: 59 | Admitting: Orthopedic Surgery

## 2012-11-02 ENCOUNTER — Encounter: Payer: Self-pay | Admitting: Orthopedic Surgery

## 2012-11-02 VITALS — BP 104/76 | Ht 69.5 in | Wt 239.0 lb

## 2012-11-02 DIAGNOSIS — M79646 Pain in unspecified finger(s): Secondary | ICD-10-CM | POA: Insufficient documentation

## 2012-11-02 DIAGNOSIS — M79644 Pain in right finger(s): Secondary | ICD-10-CM

## 2012-11-02 DIAGNOSIS — S62521A Displaced fracture of distal phalanx of right thumb, initial encounter for closed fracture: Secondary | ICD-10-CM

## 2012-11-02 DIAGNOSIS — M79609 Pain in unspecified limb: Secondary | ICD-10-CM

## 2012-11-02 DIAGNOSIS — S62639A Displaced fracture of distal phalanx of unspecified finger, initial encounter for closed fracture: Secondary | ICD-10-CM

## 2012-11-02 NOTE — Progress Notes (Signed)
Patient ID: Cynthia Donaldson, female   DOB: 12/30/1955, 57 y.o.   MRN: [id number] Chief Complaint  Patient presents with  . Hand Pain    Right thumb pain, injured 3 weeks ago   The patient complains of pain in the right thumb for 3 weeks associated with an injury while emptying a hand held vacuum. She's having sharp pain 7/10 when she uses her thumb slight pain when not in use better and improved with rest worse when she grips or grasps. There is questionable foreign body as she had a skin abrasion at the time and since that time has had tenderness over the volar aspect of the right thumb but no loss of motion.  She has a review of systems consistent with heartburn and diarrhea with some snoring some stiffness in her knees  She's had previous gallbladder removed she's had some abdominal adhesions had multiple gynecological surgeries myomectomy  History primary biliary stenosis and hypothyroidism  BP 104/76  Ht 5' 9.5" (1.765 m)  Wt 239 lb (108.41 kg)  BMI 34.8 kg/m2 General appearance is normal, the patient is alert and oriented x3 with normal mood and affect. Right thumb is swollen over the volar aspect and is tender over the crease of the IP joint flexion power is normal extension power is normal no instability scans intact no laceration is noted good color normal capillary refill. She gets an electric-like sensation when pressing on the area  X-ray shows what appears to be an avulsed fragment of bone from the distal phalanx  Impression distal phalanx fracture  Recommend activities as tolerated followup one month to see if symptoms have resolved

## 2012-11-02 NOTE — Patient Instructions (Signed)
Probable fracture right thumb

## 2012-12-02 ENCOUNTER — Ambulatory Visit (INDEPENDENT_AMBULATORY_CARE_PROVIDER_SITE_OTHER): Payer: 59 | Admitting: Orthopedic Surgery

## 2012-12-02 ENCOUNTER — Encounter: Payer: Self-pay | Admitting: Orthopedic Surgery

## 2012-12-02 VITALS — BP 124/81 | Ht 69.5 in | Wt 239.0 lb

## 2012-12-02 DIAGNOSIS — M79644 Pain in right finger(s): Secondary | ICD-10-CM

## 2012-12-02 DIAGNOSIS — M79609 Pain in unspecified limb: Secondary | ICD-10-CM

## 2012-12-02 NOTE — Patient Instructions (Addendum)
Call us if foreign body opens up

## 2012-12-02 NOTE — Progress Notes (Signed)
Patient ID: Cynthia Donaldson, female   DOB: 1955-12-08, 57 y.o.   MRN: [id number] Chief Complaint  Patient presents with  . Follow-up    1 month recheck on right thumb.    Possible foreign body versus fracture right thumb there is a nodule right at the crease of the right thumb may be a foreign body no drainage today tender and painful to touch  No cellulitis has been seen to this point  General appearance is normal, the patient is alert and oriented x3 with normal mood and affect. BP 124/81  Ht 5' 9.5" (1.765 m)  Wt 239 lb (108.41 kg)  BMI 34.8 kg/m2  Full flexion full extension tenderness over the nodule or foreign body granuloma neurovascular exam remained intact  Recommend skillful neglect if it opens up she is to call me place bacitracin on it on it and clean it with soap and water

## 2013-01-13 ENCOUNTER — Encounter (INDEPENDENT_AMBULATORY_CARE_PROVIDER_SITE_OTHER): Payer: Self-pay

## 2013-12-29 ENCOUNTER — Other Ambulatory Visit (HOSPITAL_COMMUNITY): Payer: Self-pay | Admitting: Internal Medicine

## 2013-12-29 DIAGNOSIS — Z1231 Encounter for screening mammogram for malignant neoplasm of breast: Secondary | ICD-10-CM

## 2014-01-09 ENCOUNTER — Ambulatory Visit (HOSPITAL_COMMUNITY)
Admission: RE | Admit: 2014-01-09 | Discharge: 2014-01-09 | Disposition: A | Discharge status: Home / Self Care | Payer: 59 | Source: Ambulatory Visit | Attending: Internal Medicine | Admitting: Internal Medicine

## 2014-01-09 DIAGNOSIS — Z1231 Encounter for screening mammogram for malignant neoplasm of breast: Secondary | ICD-10-CM | POA: Insufficient documentation

## 2014-01-18 IMAGING — CR DG CHEST 1V PORT
1 series · 1 of 1 positions shown · non-contrast
Comparison: 03/17/2005

CLINICAL DATA: Mid sternal chest pain worse with deep breathing.

PORTABLE CHEST - 1 VIEW

[view not recorded]
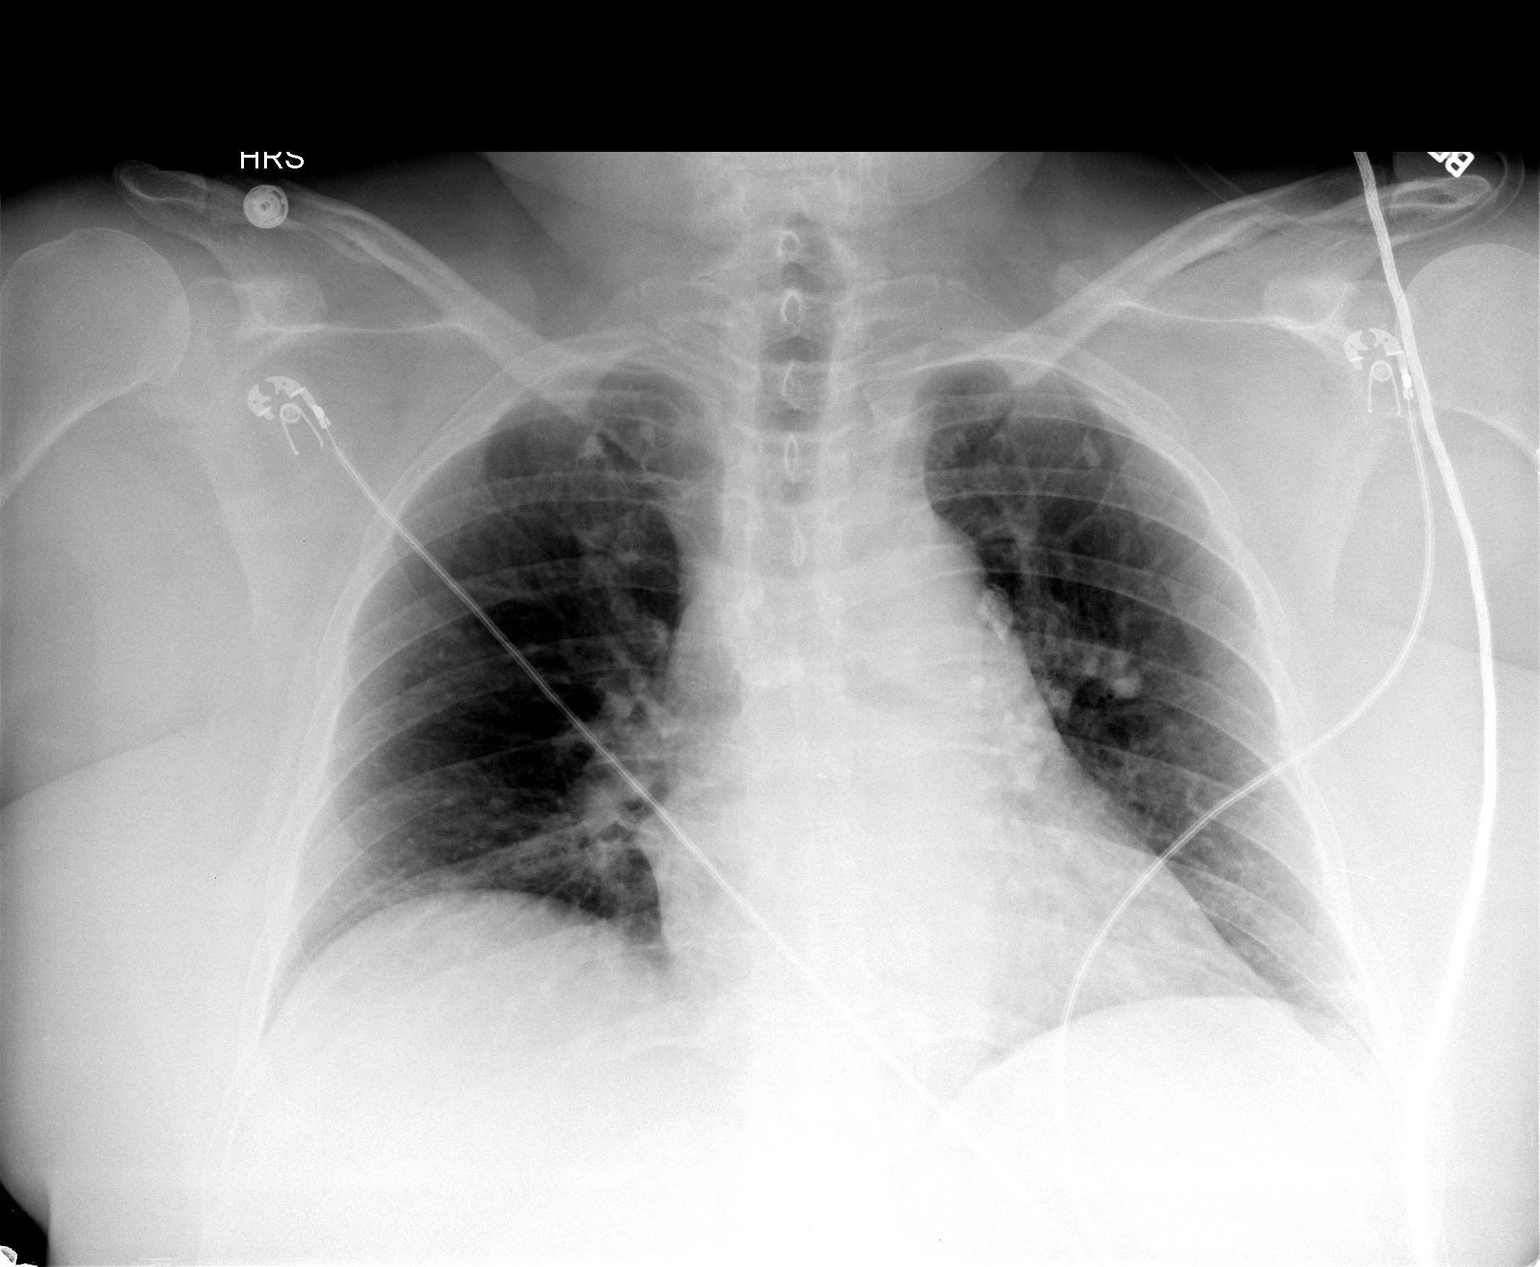

[1 of 1 positions shown; findings below may reference images not displayed]

FINDINGS: Slightly shallow inspiration.  Normal heart size and
pulmonary vascularity for technique.  Calcified granulomas in the
apices with calcified left hilar lymph nodes.  No focal
consolidation or airspace disease.  No blunting of costophrenic
angles.  No pneumothorax.  Mediastinal contours appear intact.  No
significant changes since previous study.
IMPRESSION: Chronic granulomatous changes.  No evidence of active pulmonary
disease.

## 2014-06-20 ENCOUNTER — Encounter (INDEPENDENT_AMBULATORY_CARE_PROVIDER_SITE_OTHER): Payer: Self-pay | Admitting: *Deleted

## 2015-06-21 DIAGNOSIS — Z79899 Other long term (current) drug therapy: Secondary | ICD-10-CM | POA: Diagnosis not present

## 2015-06-21 DIAGNOSIS — K743 Primary biliary cirrhosis: Secondary | ICD-10-CM | POA: Diagnosis not present

## 2015-06-21 DIAGNOSIS — E039 Hypothyroidism, unspecified: Secondary | ICD-10-CM | POA: Diagnosis not present

## 2015-06-21 DIAGNOSIS — E785 Hyperlipidemia, unspecified: Secondary | ICD-10-CM | POA: Diagnosis not present

## 2015-06-26 ENCOUNTER — Encounter (INDEPENDENT_AMBULATORY_CARE_PROVIDER_SITE_OTHER): Payer: Self-pay

## 2015-06-26 DIAGNOSIS — Z23 Encounter for immunization: Secondary | ICD-10-CM | POA: Diagnosis not present

## 2015-06-26 DIAGNOSIS — G4733 Obstructive sleep apnea (adult) (pediatric): Secondary | ICD-10-CM | POA: Diagnosis not present

## 2015-06-26 DIAGNOSIS — Z6837 Body mass index (BMI) 37.0-37.9, adult: Secondary | ICD-10-CM | POA: Diagnosis not present

## 2015-06-26 DIAGNOSIS — E785 Hyperlipidemia, unspecified: Secondary | ICD-10-CM | POA: Diagnosis not present

## 2015-06-26 DIAGNOSIS — K743 Primary biliary cirrhosis: Secondary | ICD-10-CM | POA: Diagnosis not present

## 2015-06-26 MED FILL — EZETIMIBE 10 MG TABLET: 10 | 90 days supply | Qty: 90 | Fill #0

## 2015-06-28 ENCOUNTER — Other Ambulatory Visit (HOSPITAL_COMMUNITY): Payer: Self-pay | Admitting: Respiratory Therapy

## 2015-06-28 DIAGNOSIS — G4730 Sleep apnea, unspecified: Secondary | ICD-10-CM

## 2015-07-10 MED FILL — URSODIOL 500 MG TABLET: 500 | 90 days supply | Qty: 270 | Fill #1

## 2015-07-10 MED FILL — LEVOTHYROXINE 150 MCG TAB: 150 | 90 days supply | Qty: 90 | Fill #0

## 2015-07-10 MED FILL — BELVIQ XR 20 MG TABLET: 20 | 90 days supply | Qty: 90 | Fill #0

## 2015-07-24 DIAGNOSIS — Z6837 Body mass index (BMI) 37.0-37.9, adult: Secondary | ICD-10-CM | POA: Diagnosis not present

## 2015-07-24 DIAGNOSIS — J111 Influenza due to unidentified influenza virus with other respiratory manifestations: Secondary | ICD-10-CM | POA: Diagnosis not present

## 2015-09-19 MED FILL — BUPROPION HCL XL 150 MG TAB: 150 | 90 days supply | Qty: 270 | Fill #1

## 2015-09-24 ENCOUNTER — Other Ambulatory Visit (HOSPITAL_COMMUNITY): Payer: Self-pay | Admitting: Respiratory Therapy

## 2015-09-24 DIAGNOSIS — G4730 Sleep apnea, unspecified: Secondary | ICD-10-CM

## 2015-10-03 ENCOUNTER — Other Ambulatory Visit (HOSPITAL_COMMUNITY): Payer: Self-pay | Admitting: Internal Medicine

## 2015-10-03 DIAGNOSIS — Z1231 Encounter for screening mammogram for malignant neoplasm of breast: Secondary | ICD-10-CM

## 2015-10-04 DIAGNOSIS — Z79899 Other long term (current) drug therapy: Secondary | ICD-10-CM | POA: Diagnosis not present

## 2015-10-04 DIAGNOSIS — E039 Hypothyroidism, unspecified: Secondary | ICD-10-CM | POA: Diagnosis not present

## 2015-10-04 DIAGNOSIS — R945 Abnormal results of liver function studies: Secondary | ICD-10-CM | POA: Diagnosis not present

## 2015-10-04 DIAGNOSIS — E785 Hyperlipidemia, unspecified: Secondary | ICD-10-CM | POA: Diagnosis not present

## 2015-10-09 ENCOUNTER — Encounter (INDEPENDENT_AMBULATORY_CARE_PROVIDER_SITE_OTHER): Payer: Self-pay

## 2015-10-09 ENCOUNTER — Ambulatory Visit: Payer: 59 | Attending: Internal Medicine | Admitting: Neurology

## 2015-10-09 DIAGNOSIS — G4733 Obstructive sleep apnea (adult) (pediatric): Secondary | ICD-10-CM | POA: Insufficient documentation

## 2015-10-09 DIAGNOSIS — G4730 Sleep apnea, unspecified: Secondary | ICD-10-CM | POA: Insufficient documentation

## 2015-10-10 NOTE — Progress Notes (Signed)
Subjective:     Patient ID: Cynthia Donaldson, female   DOB: November 16, 1955, 60 y.o.   MRN: [id number]  HPI   Review of Systems     Objective:   Physical Exam

## 2015-10-12 ENCOUNTER — Other Ambulatory Visit (HOSPITAL_COMMUNITY): Payer: Self-pay | Admitting: Internal Medicine

## 2015-10-14 NOTE — Procedures (Signed)
Beltrami A. Merlene Laughter, MD     [email or web address]             NOCTURNAL POLYSOMNOGRAPHY   LOCATION: [facility]  Patient Name: Cynthia Donaldson, Cynthia Donaldson Date: 10/09/2015 Gender: Female D.O.B: Jun 18, 1955 Age (years): 25 Referring Provider: Not Available Height (inches): 69 Interpreting Physician: Phillips Odor MD, ABSM Weight (lbs): 240 RPSGT: Rosebud Poles BMI: 35 MRN: [id number] Neck Size: 16.50 CLINICAL INFORMATION Sleep Study Type: Split Night CPAP Indication for sleep study: N/A Epworth Sleepiness Score: 12 SLEEP STUDY TECHNIQUE As per the AASM Manual for the Scoring of Sleep and Associated Events v2.3 (April 2016) with a hypopnea requiring 4% desaturations. The channels recorded and monitored were frontal, central and occipital EEG, electrooculogram (EOG), submentalis EMG (chin), nasal and oral airflow, thoracic and abdominal wall motion, anterior tibialis EMG, snore microphone, electrocardiogram, and pulse oximetry. Continuous positive airway pressure (CPAP) was initiated when the patient met split night criteria and was titrated according to treat sleep-disordered breathing. MEDICATIONS Medications taken by the patient : N/A Medications administered by patient during sleep study : No sleep medicine administered.  Current outpatient prescriptions:  .  buPROPion (WELLBUTRIN XL) 150 MG 24 hr tablet, Take 450 mg by mouth daily.  , Disp: , Rfl:  .  BuPROPion HCl (WELLBUTRIN PO), Take by mouth. , Disp: , Rfl:  .  fluticasone (FLONASE) 50 MCG/ACT nasal spray, Place 2 sprays into the nose daily.  , Disp: , Rfl:  .  levothyroxine (SYNTHROID, LEVOTHROID) 100 MCG tablet, Take 100 mcg by mouth daily.  , Disp: , Rfl:  .  Levothyroxine Sodium (SYNTHROID PO), Take by mouth.  , Disp: , Rfl:  .  ursodiol (ACTIGALL) 500 MG tablet, Take 1,500 mg by mouth daily. , Disp: , Rfl:   Current facility-administered medications:  .  methylPREDNISolone acetate (DEPO-MEDROL) injection  40 mg, 40 mg, Intra-articular, Once, Carole Civil, MD  RESPIRATORY PARAMETERS Diagnostic Total AHI (/hr): 31.5 RDI (/hr): 31.5 OA Index (/hr): 3.9 CA Index (/hr): 0.0 REM AHI (/hr): 81.6 NREM AHI (/hr): 21.6 Supine AHI (/hr): N/A Non-supine AHI (/hr): 31.48 Min O2 Sat (%): 70.00 Mean O2 (%): 90.14 Time below 88% (min): 23.8     Titration: Titrated between 5- 9. She did well on 8 and 9. Optimal Pressure (cm):   AHI at Optimal Pressure (/hr): 8 or 9 Min O2 at Optimal Pressure (%): 86.00 and 87. Supine % at Optimal (%): N/A Sleep % at Optimal (%): N/A     SLEEP ARCHITECTURE The recording time for the entire night was 368.2 minutes. During a baseline period of 201.4 minutes, the patient slept for 152.5 minutes in REM and nonREM, yielding a sleep efficiency of 75.7%. Sleep onset after lights out was 43.8 minutes with a REM latency of 91.5 minutes. The patient spent 3.61% of the night in stage N1 sleep, 39.02% in stage N2 sleep, 40.98% in stage N3 and 16.39% in REM. During the titration period of 163.0 minutes, the patient slept for 123.0 minutes in REM and nonREM, yielding a sleep efficiency of 75.5%. Sleep onset after CPAP initiation was 35.7 minutes with a REM latency of 38.0 minutes. The patient spent 3.66% of the night in stage N1 sleep, 38.62% in stage N2 sleep, 0.00% in stage N3 and 57.72% in REM. CARDIAC DATA The 2 lead EKG demonstrated sinus rhythm. The mean heart rate was N/A beats per minute. Other EKG findings include: None. LEG MOVEMENT DATA The total Periodic Limb Movements of Sleep (PLMS) were 13.  The PLMS index was 2.83.   IMPRESSIONS - Severe obstructive sleep apnea occurred during the diagnostic portion of the study (AHI = 31.5/hour). Optimal CPAP of 8 and 9 were reached. I suggest the lowest effective pressure of 8.   Delano Metz, MD Diplomate, American Board of Sleep Medicine.

## 2015-10-15 ENCOUNTER — Ambulatory Visit (HOSPITAL_COMMUNITY)
Admission: RE | Admit: 2015-10-15 | Discharge: 2015-10-15 | Disposition: A | Discharge status: Home / Self Care | Payer: 59 | Source: Ambulatory Visit | Attending: Internal Medicine | Admitting: Internal Medicine

## 2015-10-15 DIAGNOSIS — Z1231 Encounter for screening mammogram for malignant neoplasm of breast: Secondary | ICD-10-CM | POA: Diagnosis not present

## 2015-10-24 ENCOUNTER — Encounter: Payer: Self-pay | Admitting: Internal Medicine

## 2015-11-12 DIAGNOSIS — E785 Hyperlipidemia, unspecified: Secondary | ICD-10-CM | POA: Diagnosis not present

## 2015-11-12 DIAGNOSIS — G4730 Sleep apnea, unspecified: Secondary | ICD-10-CM | POA: Diagnosis not present

## 2015-11-12 DIAGNOSIS — E030 Congenital hypothyroidism with diffuse goiter: Secondary | ICD-10-CM | POA: Diagnosis not present

## 2015-11-12 MED FILL — LEVOTHYROXINE 175 MCG TAB: 175 | 90 days supply | Qty: 90 | Fill #0

## 2015-12-03 ENCOUNTER — Encounter (INDEPENDENT_AMBULATORY_CARE_PROVIDER_SITE_OTHER): Payer: Self-pay

## 2015-12-04 DIAGNOSIS — G4733 Obstructive sleep apnea (adult) (pediatric): Secondary | ICD-10-CM | POA: Diagnosis not present

## 2015-12-10 MED FILL — URSODIOL 500 MG TABLET: 500 | 90 days supply | Qty: 270 | Fill #2

## 2015-12-10 MED FILL — EZETIMIBE 10 MG TABLET: 10 | 90 days supply | Qty: 90 | Fill #1

## 2015-12-10 MED FILL — LEVOTHYROXINE 175 MCG TAB: 175 | 60 days supply | Qty: 60 | Fill #1

## 2015-12-11 MED FILL — BELVIQ XR 20 MG TABLET: 20 | 90 days supply | Qty: 90 | Fill #1

## 2016-01-04 DIAGNOSIS — G4733 Obstructive sleep apnea (adult) (pediatric): Secondary | ICD-10-CM | POA: Diagnosis not present

## 2016-01-08 ENCOUNTER — Institutional Professional Consult (permissible substitution): Payer: 59 | Admitting: Pulmonary Disease

## 2016-02-04 DIAGNOSIS — G4733 Obstructive sleep apnea (adult) (pediatric): Secondary | ICD-10-CM | POA: Diagnosis not present

## 2016-02-04 MED FILL — LEVOTHYROXINE 175 MCG TAB: 175 | 60 days supply | Qty: 60 | Fill #2

## 2016-02-11 DIAGNOSIS — E039 Hypothyroidism, unspecified: Secondary | ICD-10-CM | POA: Diagnosis not present

## 2016-02-11 DIAGNOSIS — K743 Primary biliary cirrhosis: Secondary | ICD-10-CM | POA: Diagnosis not present

## 2016-02-13 DIAGNOSIS — G4733 Obstructive sleep apnea (adult) (pediatric): Secondary | ICD-10-CM | POA: Diagnosis not present

## 2016-02-14 DIAGNOSIS — K743 Primary biliary cirrhosis: Secondary | ICD-10-CM | POA: Diagnosis not present

## 2016-02-14 DIAGNOSIS — E030 Congenital hypothyroidism with diffuse goiter: Secondary | ICD-10-CM | POA: Diagnosis not present

## 2016-02-14 DIAGNOSIS — Z23 Encounter for immunization: Secondary | ICD-10-CM | POA: Diagnosis not present

## 2016-02-14 DIAGNOSIS — G4733 Obstructive sleep apnea (adult) (pediatric): Secondary | ICD-10-CM | POA: Diagnosis not present

## 2016-03-05 DIAGNOSIS — G4733 Obstructive sleep apnea (adult) (pediatric): Secondary | ICD-10-CM | POA: Diagnosis not present

## 2016-04-04 MED FILL — EZETIMIBE 10 MG TABLET: 10 | 90 days supply | Qty: 90 | Fill #2

## 2016-04-04 MED FILL — BUPROPION HCL XL 150 MG TAB: 150 | 90 days supply | Qty: 270 | Fill #2

## 2016-04-04 MED FILL — LEVOTHYROXINE 175 MCG TAB: 175 | 60 days supply | Qty: 60 | Fill #3

## 2016-06-04 MED FILL — LEVOTHYROXINE 175 MCG TABLE: 175 | 60 days supply | Qty: 60 | Fill #4

## 2016-06-04 MED FILL — BELVIQ XR 20 MG TABLET: 20 | 90 days supply | Qty: 90 | Fill #0

## 2016-06-04 MED FILL — URSODIOL 500 MG TABLET: 500 | 90 days supply | Qty: 270 | Fill #0

## 2016-06-20 DIAGNOSIS — G4733 Obstructive sleep apnea (adult) (pediatric): Secondary | ICD-10-CM | POA: Diagnosis not present

## 2016-06-27 ENCOUNTER — Encounter: Payer: Self-pay | Admitting: Internal Medicine

## 2016-08-08 DIAGNOSIS — E039 Hypothyroidism, unspecified: Secondary | ICD-10-CM | POA: Diagnosis not present

## 2016-08-08 DIAGNOSIS — R945 Abnormal results of liver function studies: Secondary | ICD-10-CM | POA: Diagnosis not present

## 2016-08-13 DIAGNOSIS — G4733 Obstructive sleep apnea (adult) (pediatric): Secondary | ICD-10-CM | POA: Diagnosis not present

## 2016-08-15 DIAGNOSIS — Z6837 Body mass index (BMI) 37.0-37.9, adult: Secondary | ICD-10-CM | POA: Diagnosis not present

## 2016-08-15 DIAGNOSIS — K743 Primary biliary cirrhosis: Secondary | ICD-10-CM | POA: Diagnosis not present

## 2016-08-15 DIAGNOSIS — E039 Hypothyroidism, unspecified: Secondary | ICD-10-CM | POA: Diagnosis not present

## 2016-10-27 MED FILL — BUPROPION HCL XL 150 MG TAB: 150 | 90 days supply | Qty: 270 | Fill #0

## 2016-10-27 MED FILL — EZETIMIBE 10 MG TAB: 10 | 90 days supply | Qty: 90 | Fill #0

## 2016-12-12 DIAGNOSIS — G4733 Obstructive sleep apnea (adult) (pediatric): Secondary | ICD-10-CM | POA: Diagnosis not present

## 2017-01-19 MED FILL — URSODIOL 500 MG TABLET: 500 | 90 days supply | Qty: 270 | Fill #1

## 2017-02-10 DIAGNOSIS — K743 Primary biliary cirrhosis: Secondary | ICD-10-CM | POA: Diagnosis not present

## 2017-02-10 DIAGNOSIS — Z79899 Other long term (current) drug therapy: Secondary | ICD-10-CM | POA: Diagnosis not present

## 2017-02-10 DIAGNOSIS — E039 Hypothyroidism, unspecified: Secondary | ICD-10-CM | POA: Diagnosis not present

## 2017-02-10 DIAGNOSIS — E669 Obesity, unspecified: Secondary | ICD-10-CM | POA: Diagnosis not present

## 2017-02-17 DIAGNOSIS — E039 Hypothyroidism, unspecified: Secondary | ICD-10-CM | POA: Diagnosis not present

## 2017-02-17 DIAGNOSIS — Z23 Encounter for immunization: Secondary | ICD-10-CM | POA: Diagnosis not present

## 2017-02-17 DIAGNOSIS — K743 Primary biliary cirrhosis: Secondary | ICD-10-CM | POA: Diagnosis not present

## 2017-02-17 DIAGNOSIS — E785 Hyperlipidemia, unspecified: Secondary | ICD-10-CM | POA: Diagnosis not present

## 2017-02-23 DIAGNOSIS — G4733 Obstructive sleep apnea (adult) (pediatric): Secondary | ICD-10-CM | POA: Diagnosis not present

## 2017-03-03 DIAGNOSIS — H903 Sensorineural hearing loss, bilateral: Secondary | ICD-10-CM | POA: Diagnosis not present

## 2017-03-12 DIAGNOSIS — H903 Sensorineural hearing loss, bilateral: Secondary | ICD-10-CM | POA: Diagnosis not present

## 2017-03-13 DIAGNOSIS — H903 Sensorineural hearing loss, bilateral: Secondary | ICD-10-CM | POA: Diagnosis not present

## 2017-04-06 ENCOUNTER — Ambulatory Visit: Payer: 59 | Admitting: Adult Health

## 2017-04-06 ENCOUNTER — Encounter: Payer: Self-pay | Admitting: Adult Health

## 2017-04-06 ENCOUNTER — Encounter (INDEPENDENT_AMBULATORY_CARE_PROVIDER_SITE_OTHER): Payer: Self-pay

## 2017-04-06 ENCOUNTER — Other Ambulatory Visit: Payer: Self-pay

## 2017-04-06 VITALS — BP 116/78 | HR 89 | Resp 18 | Ht 69.0 in | Wt 242.0 lb

## 2017-04-06 DIAGNOSIS — L02214 Cutaneous abscess of groin: Secondary | ICD-10-CM

## 2017-04-06 MED ORDER — SULFAMETHOXAZOLE-TRIMETHOPRIM 800-160 MG PO TABS: ORAL_TABLET | ORAL | 0 refills | Status: DC

## 2017-04-06 NOTE — Progress Notes (Signed)
Subjective:     Patient ID: Stephannie Peters, female   DOB: 1956-04-26, 61 y.o.   MRN: [id number]  HPI Shakina is a 61 year old white female, married in complaining of boil in right groin, started itching Friday, and has progressively become more painful, is taking advil as needed and has used warm compress.   Review of Systems +pain right groin from boil Reviewed past medical,surgical, social and family history. Reviewed medications and allergies.     Objective:   Physical Exam BP 116/78 (BP Location: Right Arm, Patient Position: Sitting, Cuff Size: Large)   Pulse 89   Resp 18   Ht 5\' 9"  (1.753 m)   Wt 242 lb (109.8 kg)   BMI 35.74 kg/m   PHQ 2 score 0. Skin warm and dry.Has 3 cm, tender swollen erythemas area right groin.Is not draining.    Assessment:     1. Abscess of groin, right       Plan:     Rx septra ds 1 bid x 14 days Use warm compresses Try Boil EZE F/U 11/20 Review handout on abscess

## 2017-04-06 NOTE — Patient Instructions (Signed)

## 2017-04-14 ENCOUNTER — Ambulatory Visit: Payer: 59 | Admitting: Adult Health

## 2017-05-01 MED FILL — BUPROPION HCL XL 150 MG TAB: 150 | 90 days supply | Qty: 270 | Fill #1

## 2017-05-01 MED FILL — EZETIMIBE 10 MG TABS: 10 | 90 days supply | Qty: 90 | Fill #1

## 2017-05-01 MED FILL — LEVOTHYROXINE 175 MCG TABLE: 175 | 90 days supply | Qty: 90 | Fill #0

## 2017-05-18 DIAGNOSIS — G4733 Obstructive sleep apnea (adult) (pediatric): Secondary | ICD-10-CM | POA: Diagnosis not present

## 2017-06-03 ENCOUNTER — Telehealth: Payer: Self-pay | Admitting: Adult Health

## 2017-06-03 MED ORDER — SULFAMETHOXAZOLE-TRIMETHOPRIM 800-160 MG PO TABS: ORAL_TABLET | ORAL | 0 refills | Status: DC

## 2017-06-03 NOTE — Telephone Encounter (Signed)
Early signs of abscess recurring in groin , will rx septra ds 1 bid x 14 days to [facility]

## 2017-08-10 MED FILL — URSODIOL 500 MG TABLET: 500 | 90 days supply | Qty: 270 | Fill #0

## 2017-08-12 DIAGNOSIS — G4733 Obstructive sleep apnea (adult) (pediatric): Secondary | ICD-10-CM | POA: Diagnosis not present

## 2017-09-10 DIAGNOSIS — E039 Hypothyroidism, unspecified: Secondary | ICD-10-CM | POA: Diagnosis not present

## 2017-09-10 DIAGNOSIS — Z79899 Other long term (current) drug therapy: Secondary | ICD-10-CM | POA: Diagnosis not present

## 2017-09-10 DIAGNOSIS — E669 Obesity, unspecified: Secondary | ICD-10-CM | POA: Diagnosis not present

## 2017-09-10 DIAGNOSIS — E785 Hyperlipidemia, unspecified: Secondary | ICD-10-CM | POA: Diagnosis not present

## 2017-09-10 DIAGNOSIS — K743 Primary biliary cirrhosis: Secondary | ICD-10-CM | POA: Diagnosis not present

## 2017-09-10 MED FILL — CHLORHEXIDINE 0.12% RINSE: 0.12 | 45 days supply | Qty: 1419 | Fill #0

## 2017-09-15 DIAGNOSIS — K743 Primary biliary cirrhosis: Secondary | ICD-10-CM | POA: Diagnosis not present

## 2017-09-15 DIAGNOSIS — E785 Hyperlipidemia, unspecified: Secondary | ICD-10-CM | POA: Diagnosis not present

## 2017-09-15 DIAGNOSIS — Z6837 Body mass index (BMI) 37.0-37.9, adult: Secondary | ICD-10-CM | POA: Diagnosis not present

## 2017-09-15 MED FILL — PHENTERMINE 30 MG CAPSULE: 30 | 30 days supply | Qty: 30 | Fill #0

## 2017-09-22 ENCOUNTER — Encounter (INDEPENDENT_AMBULATORY_CARE_PROVIDER_SITE_OTHER): Payer: Self-pay | Admitting: *Deleted

## 2017-09-22 ENCOUNTER — Other Ambulatory Visit (HOSPITAL_COMMUNITY): Payer: Self-pay | Admitting: Internal Medicine

## 2017-09-22 DIAGNOSIS — Z1231 Encounter for screening mammogram for malignant neoplasm of breast: Secondary | ICD-10-CM

## 2017-09-28 ENCOUNTER — Ambulatory Visit (INDEPENDENT_AMBULATORY_CARE_PROVIDER_SITE_OTHER): Payer: 59 | Admitting: Internal Medicine

## 2017-09-28 ENCOUNTER — Encounter (HOSPITAL_COMMUNITY): Payer: Self-pay

## 2017-09-28 ENCOUNTER — Encounter (INDEPENDENT_AMBULATORY_CARE_PROVIDER_SITE_OTHER): Payer: Self-pay | Admitting: *Deleted

## 2017-09-28 ENCOUNTER — Other Ambulatory Visit (INDEPENDENT_AMBULATORY_CARE_PROVIDER_SITE_OTHER): Payer: Self-pay | Admitting: *Deleted

## 2017-09-28 ENCOUNTER — Encounter (INDEPENDENT_AMBULATORY_CARE_PROVIDER_SITE_OTHER): Payer: Self-pay | Admitting: Internal Medicine

## 2017-09-28 ENCOUNTER — Ambulatory Visit (HOSPITAL_COMMUNITY)
Admission: RE | Admit: 2017-09-28 | Discharge: 2017-09-28 | Disposition: A | Discharge status: Home / Self Care | Payer: 59 | Source: Ambulatory Visit | Attending: Internal Medicine | Admitting: Internal Medicine

## 2017-09-28 VITALS — BP 116/70 | HR 74 | Temp 98.2°F | Resp 18 | Ht 67.5 in | Wt 241.3 lb

## 2017-09-28 DIAGNOSIS — K580 Irritable bowel syndrome with diarrhea: Secondary | ICD-10-CM | POA: Diagnosis not present

## 2017-09-28 DIAGNOSIS — Z1211 Encounter for screening for malignant neoplasm of colon: Secondary | ICD-10-CM | POA: Insufficient documentation

## 2017-09-28 DIAGNOSIS — K743 Primary biliary cirrhosis: Secondary | ICD-10-CM | POA: Diagnosis not present

## 2017-09-28 DIAGNOSIS — Z1231 Encounter for screening mammogram for malignant neoplasm of breast: Secondary | ICD-10-CM | POA: Diagnosis not present

## 2017-09-28 MED ORDER — DICYCLOMINE HCL 20 MG PO TABS: 20.0000 mg | ORAL_TABLET | Freq: Two times a day (BID) | ORAL | 5 refills | Status: DC | PRN

## 2017-09-28 MED FILL — DICYCLOMINE 20 MG TABLET: 20 | 30 days supply | Qty: 60 | Fill #0

## 2017-09-28 NOTE — Progress Notes (Signed)
Presenting complaint;  Follow-up for primary biliary cholangitis. Patient interested in colonoscopy.  History of present illness:  Cynthia Donaldson is 62 year old Caucasian female who is here for scheduled visit.  We have been receiving her yearly blood work from Dr. Ria Comment office but she has not been seen in few years. She had blood work by Dr. Willey Blade on 09/10/2017.  Bilirubin was 0.5.  AP was mildly elevated 140 AST was 32 but ALT was 57 with albumin of 4.5. She remains on Urso which she takes all at the same time.  She states that it is difficult for her to remember to take it 3 times a day.  She is having diarrhea.  She has an average of 5 stools per day.  Her ranges anywhere from 2 raised stools per day.  She has urgency but has not had any accidents.  She also does not have nocturnal diarrhea.  Most of her bowel movements occur after meals.  She also noticed abdominal cramping.  She feels diarrhea is due to or so.  She therefore may not take it on days when she is traveling.  She does experience itching if she does not take or so for few days.  She has not tried any medications to slow down her bowel movements.  Last colonoscopy was in October 2007.  It was a normal exam except small external hemorrhoids. She has very good appetite.  She recently started phentermine in order to curb her appetite so she could lose weight.  She states she has lost 5 pounds in 1 week.  While she is busy she does not do any scheduled exercise or walking.  She does not smoke cigarettes and drinks alcohol occasionally. She has been busy with volunteer work at school but now psych to take a break for a few months.   Current Medications: Outpatient Encounter Medications as of 09/28/2017  Medication Sig  . buPROPion (WELLBUTRIN XL) 150 MG 24 hr tablet Take 450 mg by mouth daily.    Marland Kitchen levothyroxine (SYNTHROID, LEVOTHROID) 175 MCG tablet Take 100 mcg by mouth daily.    . phentermine (ADIPEX-P) 37.5 MG tablet Take 37.5 mg by mouth  daily before breakfast.  . ursodiol (ACTIGALL) 500 MG tablet Take 1,500 mg by mouth daily.   . [DISCONTINUED] sulfamethoxazole-trimethoprim (BACTRIM DS,SEPTRA DS) 800-160 MG tablet Take 1 bid x 14 days (Patient not taking: Reported on 09/28/2017)   Facility-Administered Encounter Medications as of 09/28/2017  Medication  . methylPREDNISolone acetate (DEPO-MEDROL) injection 40 mg   Past medical history:  Hypothyroidism. Anxiety disorder. Obesity. Internal sphincterotomy for anal fissure in December 1994 at [facility]. Laparoscopic cholecystectomy in October 2006 for symptomatic cholelithiasis. Primary biliary cholangitis.  Had liver biopsy at the time of laparoscopic cholecystectomy October 2006.  She has stage II disease. She also has fatty liver based on prior ultrasound exams. Colonoscopy in October 2007 for screening.  Normal exam except small external hemorrhoids. ERCP in April 2009 for dilated bile duct and elevated LFTs.  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  Bili stent removed 3 months later. ERCP in August 2012 at [facility] for right upper quadrant pain elevated LFTs.  He was found to have single stone.  Allergies  Allergen Reactions  . Codeine Nausea And Vomiting   Family history:  Family history significant for CRC and one brother who was 62/63 at the time of diagnosis.  She has 5 brothers and 2 sisters living and they do not have any  GI issues.  One sister and one brother committed suicide and they were both in their 33s. Father possibly died of stomach cancer at age 43.  He drank too much alcohol.  Mother had CABG in his 74s and lived to be 64.  Objective: Blood pressure 116/70, pulse 74, temperature 98.2 F (36.8 C), temperature source Oral, resp. rate 18, height 5' 7.5" (1.715 m), weight 241 lb 4.8 oz (109.5 kg). Is alert and in no acute distress. Conjunctiva is pink. Sclera is nonicteric Oropharyngeal mucosa is normal. No neck masses or  thyromegaly noted. Cardiac exam with regular rhythm normal S1 and S2. No murmur or gallop noted. Lungs are clear to auscultation. Abdomen is full.  She has few scratch marks below the right costal margin.  On palpation abdomen is soft and nontender without organomegaly or masses. No LE edema or clubbing noted.  Labs/studies Results:  Lab data from 09/10/2017 WBC 6.8.  H&H 14.9 and 44.2 and platelet count 266K. Glucose 96, BUN 12, creatinine 0.87 Serum sodium 142, potassium 4.7, chloride 106, CO2 20 Serum calcium 9.1. Bilirubin 0.5, AP 140, AST 32, ALT 57, total protein 6.8 and albumin 4.5.  Total cholesterol 197 HDL 54 LDL 123 and triglyceride 102.  Assessment:  #1.  Primary biliary cholangitis.  She was diagnosed with stage II disease in October 2006.  Lately ALT and alkaline phosphatase have been mildly elevated.  This abnormality may be due to fatty liver or the fact that she skips her medication every now and then because of diarrhea.  She has well compensated disease and no stigmata of portal hypertension. Her disease needs to be restaged noninvasively with ultrasound and elastography.  #2.  Diarrhea.  Suspect she has IBS and diarrhea could be made worse with or so.  She will be better off by splitting the dose rather than taking all of the same time.  #3.  CRC screening.  She had no polyps on her last exam of October 2007.  Family history significant for CRC and one sibling(brother) who had colon cancer age 45 or 18.  Plan:  Patient advised to take Urso in a split dose other than all at the same time. Schedule abdominal ultrasound with elastography. Screening colonoscopy to be scheduled in July 2019. Patient encouraged to incorporate regular physical activity or walking into her lifestyle. Dicyclomine 20 mg p.o. twice daily PRN. Office visit in 1 year.

## 2017-09-28 NOTE — Patient Instructions (Signed)
Abdominal ultrasound with elastography to be scheduled. Screening colonoscopy to be scheduled.

## 2017-09-29 ENCOUNTER — Ambulatory Visit (INDEPENDENT_AMBULATORY_CARE_PROVIDER_SITE_OTHER): Payer: 59 | Admitting: Internal Medicine

## 2017-10-02 ENCOUNTER — Ambulatory Visit (HOSPITAL_COMMUNITY)
Admission: RE | Admit: 2017-10-02 | Discharge: 2017-10-02 | Disposition: A | Discharge status: Home / Self Care | Payer: 59 | Source: Ambulatory Visit | Attending: Internal Medicine | Admitting: Internal Medicine

## 2017-10-02 DIAGNOSIS — Z9049 Acquired absence of other specified parts of digestive tract: Secondary | ICD-10-CM | POA: Insufficient documentation

## 2017-10-02 DIAGNOSIS — K743 Primary biliary cirrhosis: Secondary | ICD-10-CM | POA: Diagnosis not present

## 2017-10-02 DIAGNOSIS — K760 Fatty (change of) liver, not elsewhere classified: Secondary | ICD-10-CM | POA: Insufficient documentation

## 2017-10-05 ENCOUNTER — Other Ambulatory Visit (INDEPENDENT_AMBULATORY_CARE_PROVIDER_SITE_OTHER): Payer: Self-pay | Admitting: *Deleted

## 2017-10-05 DIAGNOSIS — K743 Primary biliary cirrhosis: Secondary | ICD-10-CM

## 2017-10-05 MED FILL — BUPROPION HCL XL 150 MG TAB: 150 | 90 days supply | Qty: 270 | Fill #2

## 2017-10-13 DIAGNOSIS — K743 Primary biliary cirrhosis: Secondary | ICD-10-CM | POA: Diagnosis not present

## 2017-10-13 DIAGNOSIS — E039 Hypothyroidism, unspecified: Secondary | ICD-10-CM | POA: Diagnosis not present

## 2017-10-13 DIAGNOSIS — E785 Hyperlipidemia, unspecified: Secondary | ICD-10-CM | POA: Diagnosis not present

## 2017-10-13 DIAGNOSIS — F419 Anxiety disorder, unspecified: Secondary | ICD-10-CM | POA: Diagnosis not present

## 2017-10-13 MED FILL — PHENTERMINE 30 MG CAPSULE: 30 | 30 days supply | Qty: 30 | Fill #1

## 2017-10-14 DIAGNOSIS — G4733 Obstructive sleep apnea (adult) (pediatric): Secondary | ICD-10-CM | POA: Diagnosis not present

## 2017-11-03 MED FILL — LEVOTHYROXINE 150 MCG TAB: 150 | 90 days supply | Qty: 90 | Fill #0

## 2017-11-10 MED FILL — PHENTERMINE 30 MG CAPSULE: 30 | 30 days supply | Qty: 30 | Fill #2

## 2017-11-10 MED FILL — EZETIMIBE 10 MG TABLET: 10 | 90 days supply | Qty: 90 | Fill #0

## 2017-12-01 MED FILL — URSODIOL 500 MG TABLET: 500 | 90 days supply | Qty: 270 | Fill #1

## 2017-12-02 ENCOUNTER — Encounter (HOSPITAL_COMMUNITY): Payer: Self-pay | Admitting: *Deleted

## 2017-12-02 ENCOUNTER — Ambulatory Visit (HOSPITAL_COMMUNITY)
Admission: RE | Admit: 2017-12-02 | Discharge: 2017-12-02 | Disposition: A | Discharge status: Home / Self Care | Payer: 59 | Source: Ambulatory Visit | Attending: Internal Medicine | Admitting: Internal Medicine

## 2017-12-02 ENCOUNTER — Other Ambulatory Visit: Payer: Self-pay

## 2017-12-02 ENCOUNTER — Encounter (HOSPITAL_COMMUNITY)
Admission: RE | Disposition: A | Discharge status: Home / Self Care | Payer: Self-pay | Source: Ambulatory Visit | Attending: Internal Medicine

## 2017-12-02 DIAGNOSIS — K208 Other esophagitis: Secondary | ICD-10-CM | POA: Diagnosis not present

## 2017-12-02 DIAGNOSIS — G4730 Sleep apnea, unspecified: Secondary | ICD-10-CM | POA: Insufficient documentation

## 2017-12-02 DIAGNOSIS — K5730 Diverticulosis of large intestine without perforation or abscess without bleeding: Secondary | ICD-10-CM | POA: Diagnosis not present

## 2017-12-02 DIAGNOSIS — E039 Hypothyroidism, unspecified: Secondary | ICD-10-CM | POA: Diagnosis not present

## 2017-12-02 DIAGNOSIS — Z1389 Encounter for screening for other disorder: Secondary | ICD-10-CM | POA: Insufficient documentation

## 2017-12-02 DIAGNOSIS — K209 Esophagitis, unspecified: Secondary | ICD-10-CM | POA: Insufficient documentation

## 2017-12-02 DIAGNOSIS — Z79890 Hormone replacement therapy (postmenopausal): Secondary | ICD-10-CM | POA: Diagnosis not present

## 2017-12-02 DIAGNOSIS — K317 Polyp of stomach and duodenum: Secondary | ICD-10-CM | POA: Insufficient documentation

## 2017-12-02 DIAGNOSIS — Z1211 Encounter for screening for malignant neoplasm of colon: Secondary | ICD-10-CM | POA: Diagnosis not present

## 2017-12-02 DIAGNOSIS — Z79899 Other long term (current) drug therapy: Secondary | ICD-10-CM | POA: Insufficient documentation

## 2017-12-02 DIAGNOSIS — Z8719 Personal history of other diseases of the digestive system: Secondary | ICD-10-CM | POA: Diagnosis not present

## 2017-12-02 DIAGNOSIS — K648 Other hemorrhoids: Secondary | ICD-10-CM | POA: Diagnosis not present

## 2017-12-02 DIAGNOSIS — E785 Hyperlipidemia, unspecified: Secondary | ICD-10-CM | POA: Insufficient documentation

## 2017-12-02 DIAGNOSIS — Z13810 Encounter for screening for upper gastrointestinal disorder: Secondary | ICD-10-CM | POA: Diagnosis not present

## 2017-12-02 DIAGNOSIS — D123 Benign neoplasm of transverse colon: Secondary | ICD-10-CM | POA: Diagnosis not present

## 2017-12-02 DIAGNOSIS — K644 Residual hemorrhoidal skin tags: Secondary | ICD-10-CM | POA: Diagnosis not present

## 2017-12-02 DIAGNOSIS — Z87891 Personal history of nicotine dependence: Secondary | ICD-10-CM | POA: Insufficient documentation

## 2017-12-02 DIAGNOSIS — K3189 Other diseases of stomach and duodenum: Secondary | ICD-10-CM | POA: Diagnosis not present

## 2017-12-02 DIAGNOSIS — Z800 Family history of malignant neoplasm of digestive organs: Secondary | ICD-10-CM | POA: Insufficient documentation

## 2017-12-02 DIAGNOSIS — K228 Other specified diseases of esophagus: Secondary | ICD-10-CM | POA: Diagnosis not present

## 2017-12-02 HISTORY — DX: Other specified postprocedural states: Z98.890

## 2017-12-02 HISTORY — DX: Nausea with vomiting, unspecified: R11.2

## 2017-12-02 HISTORY — PX: COLONOSCOPY: SHX5424

## 2017-12-02 SURGERY — COLONOSCOPY
Anesthesia: Moderate Sedation

## 2017-12-02 MED ORDER — MIDAZOLAM HCL 5 MG/5ML IJ SOLN
INTRAMUSCULAR | Status: DC | PRN
  Administered 2017-12-02: 2 mg via INTRAVENOUS
  Administered 2017-12-02 (×2): 1 mg via INTRAVENOUS
  Administered 2017-12-02: 2 mg via INTRAVENOUS
  Administered 2017-12-02: 1 mg via INTRAVENOUS
  Administered 2017-12-02 (×3): 2 mg via INTRAVENOUS

## 2017-12-02 MED ORDER — MEPERIDINE HCL 50 MG/ML IJ SOLN
INTRAMUSCULAR | Status: AC
  Filled 2017-12-02: qty 1

## 2017-12-02 MED ORDER — LIDOCAINE VISCOUS HCL 2 % MT SOLN
OROMUCOSAL | Status: AC
  Filled 2017-12-02: qty 15

## 2017-12-02 MED ORDER — SODIUM CHLORIDE 0.9 % IV SOLN
INTRAVENOUS | Status: DC
  Administered 2017-12-02: 1000 mL via INTRAVENOUS

## 2017-12-02 MED ORDER — STERILE WATER FOR IRRIGATION IR SOLN
Status: DC | PRN
  Administered 2017-12-02: 2.5 mL

## 2017-12-02 MED ORDER — MEPERIDINE HCL 50 MG/ML IJ SOLN
INTRAMUSCULAR | Status: DC | PRN
  Administered 2017-12-02 (×2): 25 mg via INTRAVENOUS

## 2017-12-02 MED ORDER — MIDAZOLAM HCL 5 MG/5ML IJ SOLN
INTRAMUSCULAR | Status: AC
  Filled 2017-12-02: qty 5

## 2017-12-02 MED ORDER — MIDAZOLAM HCL 5 MG/5ML IJ SOLN
INTRAMUSCULAR | Status: AC
  Filled 2017-12-02: qty 10

## 2017-12-02 MED ORDER — LIDOCAINE VISCOUS HCL 2 % MT SOLN
OROMUCOSAL | Status: DC | PRN
  Administered 2017-12-02: 5 mL via OROMUCOSAL

## 2017-12-02 NOTE — Op Note (Signed)
[facility] Patient Name: Cynthia Donaldson Procedure Date: 12/02/2017 10:38 AM MRN: [id number] Date of Birth: August 21, 1955 Attending MD: Hildred Laser , MD CSN: [id number] Age: 62 Admit Type: Outpatient Procedure:                Colonoscopy Indications:              Screening patient at increased risk: Family history                            of 1st-degree relative with colorectal cancer at                            age 33 years (or older) Providers:                Hildred Laser, MD, Gwynneth Albright RN, RN,                            Nelma Rothman, Technician Referring MD:             Asencion Noble, MD Medicines:                Midazolam 5 mg IV Complications:            No immediate complications. Estimated Blood Loss:     Estimated blood loss was minimal. Procedure:                After obtaining informed consent, the colonoscope                            was passed under direct vision. Throughout the                            procedure, the patient's blood pressure, pulse, and                            oxygen saturations were monitored continuously. The                            PCF-H190DL ([id number]) scope was introduced through                            the anus and advanced to the the cecum, identified                            by appendiceal orifice and ileocecal valve. The                            colonoscopy was performed without difficulty. The                            patient tolerated the procedure well. The quality                            of the bowel preparation was good. The ileocecal  valve, appendiceal orifice, and rectum were                            photographed. Scope In: 11:10:24 AM Scope Out: 11:34:48 AM Scope Withdrawal Time: 0 hours 15 minutes 10 seconds  Total Procedure Duration: 0 hours 24 minutes 24 seconds  Findings:      The perianal and digital rectal examinations were normal.      Two sessile polyps were found in  the splenic flexure and transverse       colon. The polyps were small in size. These were biopsied with a cold       forceps for histology. The pathology specimen was placed into Bottle       Number 3.      Scattered small-mouthed diverticula were found in the sigmoid colon.      External hemorrhoids were found during retroflexion. The hemorrhoids       were small. Impression:               - Two small polyps at the splenic flexure and in                            the transverse colon. Biopsied.                           - Diverticulosis in the sigmoid colon.                           - External hemorrhoids. Moderate Sedation:      Moderate (conscious) sedation was administered by the endoscopy nurse       and supervised by the endoscopist. The following parameters were       monitored: oxygen saturation, heart rate, blood pressure, CO2       capnography and response to care. Total physician intraservice time was       24 minutes. Recommendation:           - Patient has a contact number available for                            emergencies. The signs and symptoms of potential                            delayed complications were discussed with the                            patient. Return to normal activities tomorrow.                            Written discharge instructions were provided to the                            patient.                           - High fiber diet today.                           - Continue present medications.                           -  No aspirin, ibuprofen, naproxen, or other                            non-steroidal anti-inflammatory drugs for 1 day.                           - Await pathology results.                           - Repeat colonoscopy is recommended. The                            colonoscopy date will be determined after pathology                            results from today's exam become available for                             review. Procedure Code(s):        --- Professional ---                           [phone], Colonoscopy, flexible; with biopsy, single                            or multiple                           G0500, Moderate sedation services provided by the                            same physician or other qualified health care                            professional performing a gastrointestinal                            endoscopic service that sedation supports,                            requiring the presence of an independent trained                            observer to assist in the monitoring of the                            patient's level of consciousness and physiological                            status; initial 15 minutes of intra-service time;                            patient age 49 years or older (additional time 64  be reported with [phone], as appropriate)                           [phone], Moderate sedation services provided by the                            same physician or other qualified health care                            professional performing the diagnostic or                            therapeutic service that the sedation supports,                            requiring the presence of an independent trained                            observer to assist in the monitoring of the                            patient's level of consciousness and physiological                            status; each additional 15 minutes intraservice                            time (List separately in addition to code for                            primary service) Diagnosis Code(s):        --- Professional ---                           Z80.0, Family history of malignant neoplasm of                            digestive organs                           D12.3, Benign neoplasm of transverse colon (hepatic                            flexure or splenic flexure)                            K64.4, Residual hemorrhoidal skin tags                           K57.30, Diverticulosis of large intestine without                            perforation or abscess without bleeding CPT copyright 2017 [employer]. All rights reserved. The codes documented in this report are preliminary and upon coder review may  be revised to meet current compliance  requirements. Hildred Laser, MD Hildred Laser, MD 12/02/2017 11:49:34 AM This report has been signed electronically. Number of Addenda: 0

## 2017-12-02 NOTE — H&P (Signed)
Cynthia Donaldson is an 62 y.o. female.   Chief Complaint: Patient is here for EGD and colonoscopy. HPI: Patient is 62 year old Caucasian female who was biopsy-proven primary biliary cholangitis was noted to have mildly elevated transaminases.  She underwent ultrasound with elastography.  Elastography revealed F3 and F4 disease.  She is therefore undergoing EGD to screen for varices.  She does complain of intermittent epigastric pain which generally wakes her up around 4:00 in the morning.  Last episode was 2 weeks ago.  She has noted some improvement with OTC H2 B. She is also undergoing screening colonoscopy.  She denies bleeding.  Family history significant for CRC in brother who is 23 or 25 years old at the time of diagnosis he had advanced disease. Last colonoscopy was 12 years ago.  Past Medical History:  Diagnosis Date  .  Primary biliary cholangitis.   . Hyperlipidemia   . Hypothyroidism   . PONV (postoperative nausea and vomiting)   . Sleep apnea         History of common duct stones.  Past Surgical History:  Procedure Laterality Date  . anal fissure repair    . CHOLECYSTECTOMY    . ERCP      X3  . MYOMECTOMY    . TONSILLECTOMY      Family History  Problem Relation Age of Onset  . Heart disease Unknown   . Arthritis Unknown   . Cancer Unknown   . Hypertension Father   . Cancer Father        stomach  . Heart disease Mother   . Colon cancer Brother   . Suicidality Sister   . Suicidality Brother   . Hypertension Brother   . Arthritis Brother   . Hypertension Brother   . Arthritis Brother   . Hypertension Brother   . Arthritis Brother    Social History:  reports that she has quit smoking. She has never used smokeless tobacco. She reports that she drinks alcohol. She reports that she does not use drugs.  Allergies:  Allergies  Allergen Reactions  . Codeine Nausea And Vomiting    Facility-Administered Medications Prior to Admission  Medication Dose Route Frequency  Provider Last Rate Last Dose  . methylPREDNISolone acetate (DEPO-MEDROL) injection 40 mg  40 mg Intra-articular Once Carole Civil, MD       Medications Prior to Admission  Medication Sig Dispense Refill  . buPROPion (WELLBUTRIN XL) 150 MG 24 hr tablet Take 450 mg by mouth daily.      Marland Kitchen ezetimibe (ZETIA) 10 MG tablet Take 10 mg by mouth daily.    Marland Kitchen levothyroxine (SYNTHROID, LEVOTHROID) 150 MCG tablet Take 150 mcg by mouth daily before breakfast.     . phentermine 30 MG capsule Take 30 mg by mouth daily.  3  . ursodiol (ACTIGALL) 500 MG tablet Take 1,500 mg by mouth daily.     Marland Kitchen dicyclomine (BENTYL) 20 MG tablet Take 1 tablet (20 mg total) by mouth 2 (two) times daily as needed for spasms. (Patient not taking: Reported on 12/01/2017) 60 tablet 5    No results found for this or any previous visit (from the past 48 hour(s)). No results found.  ROS  Blood pressure 117/86, pulse 74, temperature 98.7 F (37.1 C), temperature source Oral, resp. rate 13, height 5\' 9"  (1.753 m), weight 219 lb (99.3 kg), SpO2 96 %. Physical Exam  Constitutional: She appears well-developed.  HENT:  Mouth/Throat: Oropharynx is clear and moist.  Eyes: Conjunctivae  are normal. No scleral icterus.  Neck: No thyromegaly present.  Cardiovascular: Normal rate, regular rhythm and normal heart sounds.  No murmur heard. Respiratory: Effort normal and breath sounds normal.  GI:  Abdomen is full but soft and nontender with organomegaly or masses.  Musculoskeletal: She exhibits no edema.  Lymphadenopathy:    She has no cervical adenopathy.  Neurological: She is alert.  Skin: Skin is warm and dry.     Assessment/Plan History of primary biliary cholangitis. Elastography reveals F3 and F4 disease. Screening of esophageal varices and screening colonoscopy.  Hildred Laser, MD 12/02/2017, 10:40 AM

## 2017-12-02 NOTE — Op Note (Signed)
[facility] Patient Name: Cynthia Donaldson Procedure Date: 12/02/2017 10:48 AM MRN: [id number] Date of Birth: Oct 13, 1955 Attending MD: Hildred Laser , MD CSN: [id number] Age: 62 Admit Type: Outpatient Procedure:                Upper GI endoscopy Indications:              Screening procedure. Screen for varices.patient                            with PBC. Providers:                Hildred Laser, MD, Gwynneth Albright RN, RN,                            Nelma Rothman, Technician Referring MD:             Asencion Noble, MD Medicines:                Lidocaine spray, Meperidine 50 mg IV, Midazolam 8                            mg IV Complications:            No immediate complications. Estimated Blood Loss:     Estimated blood loss was minimal. Procedure:                Pre-Anesthesia Assessment:                           - Prior to the procedure, a History and Physical                            was performed, and patient medications and                            allergies were reviewed. The patient's tolerance of                            previous anesthesia was also reviewed. The risks                            and benefits of the procedure and the sedation                            options and risks were discussed with the patient.                            All questions were answered, and informed consent                            was obtained. Prior Anticoagulants: The patient has                            taken no previous anticoagulant or antiplatelet  agents. ASA Grade Assessment: II - A patient with                            mild systemic disease. After reviewing the risks                            and benefits, the patient was deemed in                            satisfactory condition to undergo the procedure.                           - Prior to the procedure, a History and Physical                            was performed, and patient medications  and                            allergies were reviewed. The patient's tolerance of                            previous anesthesia was also reviewed. The risks                            and benefits of the procedure and the sedation                            options and risks were discussed with the patient.                            All questions were answered, and informed consent                            was obtained. Prior Anticoagulants: The patient has                            taken no previous anticoagulant or antiplatelet                            agents. ASA Grade Assessment: II - A patient with                            mild systemic disease. After reviewing the risks                            and benefits, the patient was deemed in                            satisfactory condition to undergo the procedure.                           After obtaining informed consent, the endoscope was  passed under direct vision. Throughout the                            procedure, the patient's blood pressure, pulse, and                            oxygen saturations were monitored continuously. The                            GIF-H190 ([id number]) was introduced through the and                            advanced to the second part of duodenum. The upper                            GI endoscopy was accomplished without difficulty.                            The patient tolerated the procedure well. Scope In: 10:52:20 AM Scope Out: 11:03:23 AM Total Procedure Duration: 0 hours 11 minutes 3 seconds  Findings:      The examined esophagus was normal.      The Z-line was irregular and was found 39 cm from the incisors. Biopsies       were taken with a cold forceps for histology. The pathology specimen was       placed into Bottle Number 2.      A single 5 to 8 mm sessile polyp was found in the gastric antrum.       Biopsies were taken with a cold forceps for histology.  The pathology       specimen was placed into Bottle Number 1.      A few erosions were found in the gastric antrum.      The cardia, gastric fundus, gastric body and pylorus were normal.      The duodenal bulb and second portion of the duodenum were normal. Impression:               - Normal esophagus.                           - Z-line irregular, 39 cm from the incisors.                            Biopsied.                           - A single gastric polyp. Biopsied.                           - Erosive gastropathy.                           - Normal cardia, gastric fundus, gastric body and                            pylorus.                           -  Normal duodenal bulb and second portion of the                            duodenum. Moderate Sedation:      Moderate (conscious) sedation was administered by the endoscopy nurse       and supervised by the endoscopist. The following parameters were       monitored: oxygen saturation, heart rate, blood pressure, CO2       capnography and response to care. Total physician intraservice time was       11 minutes. Recommendation:           - Patient has a contact number available for                            emergencies. The signs and symptoms of potential                            delayed complications were discussed with the                            patient. Return to normal activities tomorrow.                            Written discharge instructions were provided to the                            patient.                           - Resume previous diet today.                           - Continue present medications.                           - No aspirin, ibuprofen, naproxen, or other                            non-steroidal anti-inflammatory drugs for 1 day.                           - Await pathology results. Procedure Code(s):        --- Professional ---                           [phone], Esophagogastroduodenoscopy, flexible,                             transoral; with biopsy, single or multiple                           G0500, Moderate sedation services provided by the                            same physician or other qualified health care  professional performing a gastrointestinal                            endoscopic service that sedation supports,                            requiring the presence of an independent trained                            observer to assist in the monitoring of the                            patient's level of consciousness and physiological                            status; initial 15 minutes of intra-service time;                            patient age 77 years or older (additional time may                            be reported with [phone], as appropriate) Diagnosis Code(s):        --- Professional ---                           K22.8, Other specified diseases of esophagus                           K31.7, Polyp of stomach and duodenum                           K31.89, Other diseases of stomach and duodenum                           Z13.810, Encounter for screening for upper                            gastrointestinal disorder CPT copyright 2017 [employer]. All rights reserved. The codes documented in this report are preliminary and upon coder review may  be revised to meet current compliance requirements. Hildred Laser, MD Hildred Laser, MD 12/02/2017 11:45:10 AM This report has been signed electronically. Number of Addenda: 0

## 2017-12-02 NOTE — Discharge Instructions (Signed)
No aspirin or NSAIDs for 24 hours. Resume usual medications as before. High-fiber diet. No driving for 24 hours. Patient will call with biopsy results.   Colonoscopy, Adult, Care After This sheet gives you information about how to care for yourself after your procedure. Your health care provider may also give you more specific instructions. If you have problems or questions, contact your health care provider. What can I expect after the procedure? After the procedure, it is common to have:  A small amount of blood in your stool for 24 hours after the procedure.  Some gas.  Mild abdominal cramping or bloating.  Follow these instructions at home: General instructions   For the first 24 hours after the procedure: ? Do not drive or use machinery. ? Do not sign important documents. ? Do not drink alcohol. ? Do your regular daily activities at a slower pace than normal. ? Eat soft, easy-to-digest foods. ? Rest often.  Take over-the-counter or prescription medicines only as told by your health care provider.  It is up to you to get the results of your procedure. Ask your health care provider, or the department performing the procedure, when your results will be ready. Relieving cramping and bloating  Try walking around when you have cramps or feel bloated.  Apply heat to your abdomen as told by your health care provider. Use a heat source that your health care provider recommends, such as a moist heat pack or a heating pad. ? Place a towel between your skin and the heat source. ? Leave the heat on for 20-30 minutes. ? Remove the heat if your skin turns bright red. This is especially important if you are unable to feel pain, heat, or cold. You may have a greater risk of getting burned. Eating and drinking  Drink enough fluid to keep your urine clear or pale yellow.  Resume your normal diet as instructed by your health care provider. Avoid heavy or fried foods that are hard to  digest.  Avoid drinking alcohol for as long as instructed by your health care provider. Contact a health care provider if:  You have blood in your stool 2-3 days after the procedure. Get help right away if:  You have more than a small spotting of blood in your stool.  You pass large blood clots in your stool.  Your abdomen is swollen.  You have nausea or vomiting.  You have a fever.  You have increasing abdominal pain that is not relieved with medicine. This information is not intended to replace advice given to you by your health care provider. Make sure you discuss any questions you have with your health care provider. Document Released: 12/25/2003 Document Revised: 02/04/2016 Document Reviewed: 07/24/2015 Elsevier Interactive Patient Education  2018 [facility]oscopy, Care After Refer to this sheet in the next few weeks. These instructions provide you with information about caring for yourself after your procedure. Your health care provider may also give you more specific instructions. Your treatment has been planned according to current medical practices, but problems sometimes occur. Call your health care provider if you have any problems or questions after your procedure. What can I expect after the procedure? After the procedure, it is common to have:  A sore throat.  Bloating.  Nausea.  Follow these instructions at home:  Follow instructions from your health care provider about what to eat or drink after your procedure.  Return to your normal activities as told by your  health care provider. Ask your health care provider what activities are safe for you.  Take over-the-counter and prescription medicines only as told by your health care provider.  Do not drive for 24 hours if you received a sedative.  Keep all follow-up visits as told by your health care provider. This is important. Contact a health care provider if:  You have a sore throat that  lasts longer than one day.  You have trouble swallowing. Get help right away if:  You have a fever.  You vomit blood or your vomit looks like coffee grounds.  You have bloody, black, or tarry stools.  You have a severe sore throat or you cannot swallow.  You have difficulty breathing.  You have severe pain in your chest or belly. This information is not intended to replace advice given to you by your health care provider. Make sure you discuss any questions you have with your health care provider. Document Released: 11/11/2011 Document Revised: 10/18/2015 Document Reviewed: 02/22/2015 Elsevier Interactive Patient Education  2018 [employer].   Hemorrhoids Hemorrhoids are swollen veins in and around the rectum or anus. There are two types of hemorrhoids:  Internal hemorrhoids. These occur in the veins that are just inside the rectum. They may poke through to the outside and become irritated and painful.  External hemorrhoids. These occur in the veins that are outside of the anus and can be felt as a painful swelling or hard lump near the anus.  Most hemorrhoids do not cause serious problems, and they can be managed with home treatments such as diet and lifestyle changes. If home treatments do not help your symptoms, procedures can be done to shrink or remove the hemorrhoids. What are the causes? This condition is caused by increased pressure in the anal area. This pressure may result from various things, including:  Constipation.  Straining to have a bowel movement.  Diarrhea.  Pregnancy.  Obesity.  Sitting for long periods of time.  Heavy lifting or other activity that causes you to strain.  Anal sex.  What are the signs or symptoms? Symptoms of this condition include:  Pain.  Anal itching or irritation.  Rectal bleeding.  Leakage of stool (feces).  Anal swelling.  One or more lumps around the anus.  How is this diagnosed? This condition can often be  diagnosed through a visual exam. Other exams or tests may also be done, such as:  Examination of the rectal area with a gloved hand (digital rectal exam).  Examination of the anal canal using a small tube (anoscope).  A blood test, if you have lost a significant amount of blood.  A test to look inside the colon (sigmoidoscopy or colonoscopy).  How is this treated? This condition can usually be treated at home. However, various procedures may be done if dietary changes, lifestyle changes, and other home treatments do not help your symptoms. These procedures can help make the hemorrhoids smaller or remove them completely. Some of these procedures involve surgery, and others do not. Common procedures include:  Rubber band ligation. Rubber bands are placed at the base of the hemorrhoids to cut off the blood supply to them.  Sclerotherapy. Medicine is injected into the hemorrhoids to shrink them.  Infrared coagulation. A type of light energy is used to get rid of the hemorrhoids.  Hemorrhoidectomy surgery. The hemorrhoids are surgically removed, and the veins that supply them are tied off.  Stapled hemorrhoidopexy surgery. A circular stapling device is used to  remove the hemorrhoids and use staples to cut off the blood supply to them.  Follow these instructions at home: Eating and drinking  Eat foods that have a lot of fiber in them, such as whole grains, beans, nuts, fruits, and vegetables. Ask your health care provider about taking products that have added fiber (fiber supplements).  Drink enough fluid to keep your urine clear or pale yellow. Managing pain and swelling  Take warm sitz baths for 20 minutes, 3-4 times a day to ease pain and discomfort.  If directed, apply ice to the affected area. Using ice packs between sitz baths may be helpful. ? Put ice in a plastic bag. ? Place a towel between your skin and the bag. ? Leave the ice on for 20 minutes, 2-3 times a day. General  instructions  Take over-the-counter and prescription medicines only as told by your health care provider.  Use medicated creams or suppositories as told.  Exercise regularly.  Go to the bathroom when you have the urge to have a bowel movement. Do not wait.  Avoid straining to have bowel movements.  Keep the anal area dry and clean. Use wet toilet paper or moist towelettes after a bowel movement.  Do not sit on the toilet for long periods of time. This increases blood pooling and pain. Contact a health care provider if:  You have increasing pain and swelling that are not controlled by treatment or medicine.  You have uncontrolled bleeding.  You have difficulty having a bowel movement, or you are unable to have a bowel movement.  You have pain or inflammation outside the area of the hemorrhoids. This information is not intended to replace advice given to you by your health care provider. Make sure you discuss any questions you have with your health care provider. Document Released: 05/09/2000 Document Revised: 10/10/2015 Document Reviewed: 01/24/2015 Elsevier Interactive Patient Education  2018 [employer].  Colon Polyps Polyps are tissue growths inside the body. Polyps can grow in many places, including the large intestine (colon). A polyp may be a round bump or a mushroom-shaped growth. You could have one polyp or several. Most colon polyps are noncancerous (benign). However, some colon polyps can become cancerous over time. What are the causes? The exact cause of colon polyps is not known. What increases the risk? This condition is more likely to develop in people who:  Have a family history of colon cancer or colon polyps.  Are older than 30 or older than 45 if they are African American.  Have inflammatory bowel disease, such as ulcerative colitis or Crohn disease.  Are overweight.  Smoke cigarettes.  Do not get enough exercise.  Drink too much alcohol.  Eat a  diet that is: ? High in fat and red meat. ? Low in fiber.  Had childhood cancer that was treated with abdominal radiation.  What are the signs or symptoms? Most polyps do not cause symptoms. If you have symptoms, they may include:  Blood coming from your rectum when having a bowel movement.  Blood in your stool.The stool may look dark red or black.  A change in bowel habits, such as constipation or diarrhea.  How is this diagnosed? This condition is diagnosed with a colonoscopy. This is a procedure that uses a lighted, flexible scope to look at the inside of your colon. How is this treated? Treatment for this condition involves removing any polyps that are found. Those polyps will then be tested for cancer. If cancer  is found, your health care provider will talk to you about options for colon cancer treatment. Follow these instructions at home: Diet  Eat plenty of fiber, such as fruits, vegetables, and whole grains.  Eat foods that are high in calcium and vitamin D, such as milk, cheese, yogurt, eggs, liver, fish, and broccoli.  Limit foods high in fat, red meats, and processed meats, such as hot dogs, sausage, bacon, and lunch meats.  Maintain a healthy weight, or lose weight if recommended by your health care provider. General instructions  Do not smoke cigarettes.  Do not drink alcohol excessively.  Keep all follow-up visits as told by your health care provider. This is important. This includes keeping regularly scheduled colonoscopies. Talk to your health care provider about when you need a colonoscopy.  Exercise every day or as told by your health care provider. Contact a health care provider if:  You have new or worsening bleeding during a bowel movement.  You have new or increased blood in your stool.  You have a change in bowel habits.  You unexpectedly lose weight. This information is not intended to replace advice given to you by your health care provider.  Make sure you discuss any questions you have with your health care provider. Document Released: 02/06/2004 Document Revised: 10/18/2015 Document Reviewed: 04/02/2015 Elsevier Interactive Patient Education  [employer].

## 2017-12-07 ENCOUNTER — Encounter (HOSPITAL_COMMUNITY): Payer: Self-pay | Admitting: Internal Medicine

## 2017-12-08 DIAGNOSIS — K760 Fatty (change of) liver, not elsewhere classified: Secondary | ICD-10-CM | POA: Diagnosis not present

## 2017-12-08 MED FILL — PHENTERMINE 30 MG CAPSULE: 30 | 30 days supply | Qty: 30 | Fill #3

## 2017-12-25 ENCOUNTER — Other Ambulatory Visit: Payer: Self-pay | Admitting: Student

## 2017-12-25 ENCOUNTER — Other Ambulatory Visit: Payer: Self-pay | Admitting: Radiology

## 2017-12-27 ENCOUNTER — Other Ambulatory Visit: Payer: Self-pay | Admitting: Student

## 2017-12-28 ENCOUNTER — Ambulatory Visit (HOSPITAL_COMMUNITY)
Admission: RE | Admit: 2017-12-28 | Discharge: 2017-12-28 | Disposition: A | Discharge status: Home / Self Care | Payer: 59 | Source: Ambulatory Visit | Attending: Internal Medicine | Admitting: Internal Medicine

## 2017-12-28 ENCOUNTER — Encounter (HOSPITAL_COMMUNITY): Payer: Self-pay

## 2017-12-28 DIAGNOSIS — K760 Fatty (change of) liver, not elsewhere classified: Secondary | ICD-10-CM | POA: Diagnosis not present

## 2017-12-28 DIAGNOSIS — Z8261 Family history of arthritis: Secondary | ICD-10-CM | POA: Diagnosis not present

## 2017-12-28 DIAGNOSIS — Z79899 Other long term (current) drug therapy: Secondary | ICD-10-CM | POA: Insufficient documentation

## 2017-12-28 DIAGNOSIS — Z9049 Acquired absence of other specified parts of digestive tract: Secondary | ICD-10-CM | POA: Insufficient documentation

## 2017-12-28 DIAGNOSIS — Z9889 Other specified postprocedural states: Secondary | ICD-10-CM | POA: Diagnosis not present

## 2017-12-28 DIAGNOSIS — R7989 Other specified abnormal findings of blood chemistry: Secondary | ICD-10-CM | POA: Insufficient documentation

## 2017-12-28 DIAGNOSIS — Z818 Family history of other mental and behavioral disorders: Secondary | ICD-10-CM | POA: Insufficient documentation

## 2017-12-28 DIAGNOSIS — Z8249 Family history of ischemic heart disease and other diseases of the circulatory system: Secondary | ICD-10-CM | POA: Insufficient documentation

## 2017-12-28 DIAGNOSIS — Z800 Family history of malignant neoplasm of digestive organs: Secondary | ICD-10-CM | POA: Insufficient documentation

## 2017-12-28 DIAGNOSIS — E785 Hyperlipidemia, unspecified: Secondary | ICD-10-CM | POA: Diagnosis not present

## 2017-12-28 DIAGNOSIS — K743 Primary biliary cirrhosis: Secondary | ICD-10-CM | POA: Diagnosis not present

## 2017-12-28 DIAGNOSIS — K8309 Other cholangitis: Secondary | ICD-10-CM | POA: Diagnosis not present

## 2017-12-28 DIAGNOSIS — G4730 Sleep apnea, unspecified: Secondary | ICD-10-CM | POA: Diagnosis not present

## 2017-12-28 DIAGNOSIS — R945 Abnormal results of liver function studies: Secondary | ICD-10-CM | POA: Diagnosis not present

## 2017-12-28 DIAGNOSIS — Z885 Allergy status to narcotic agent status: Secondary | ICD-10-CM | POA: Insufficient documentation

## 2017-12-28 DIAGNOSIS — E039 Hypothyroidism, unspecified: Secondary | ICD-10-CM | POA: Diagnosis not present

## 2017-12-28 DIAGNOSIS — K831 Obstruction of bile duct: Secondary | ICD-10-CM | POA: Diagnosis not present

## 2017-12-28 LAB — CBC
HCT: 45 % (ref 36.0–46.0)
Hemoglobin: 14.8 g/dL (ref 12.0–15.0)
MCH: 30.8 pg (ref 26.0–34.0)
MCHC: 32.9 g/dL (ref 30.0–36.0)
MCV: 93.6 fL (ref 78.0–100.0)
Platelets: 248 10*3/uL (ref 150–400)
RBC: 4.81 MIL/uL (ref 3.87–5.11)
RDW: 12.7 % (ref 11.5–15.5)
WBC: 7.5 10*3/uL (ref 4.0–10.5)

## 2017-12-28 LAB — PROTIME-INR
INR: 0.95
Prothrombin Time: 12.6 seconds (ref 11.4–15.2)

## 2017-12-28 LAB — APTT: APTT: 27 s (ref 24–36)

## 2017-12-28 MED ORDER — FENTANYL CITRATE (PF) 100 MCG/2ML IJ SOLN
INTRAMUSCULAR | Status: AC
  Filled 2017-12-28: qty 4

## 2017-12-28 MED ORDER — GELATIN ABSORBABLE 12-7 MM EX MISC
CUTANEOUS | Status: AC
  Filled 2017-12-28: qty 1

## 2017-12-28 MED ORDER — MIDAZOLAM HCL 2 MG/2ML IJ SOLN
INTRAMUSCULAR | Status: AC | PRN
  Administered 2017-12-28 (×2): 1 mg via INTRAVENOUS

## 2017-12-28 MED ORDER — LIDOCAINE HCL (PF) 1 % IJ SOLN
INTRAMUSCULAR | Status: AC
  Filled 2017-12-28: qty 30

## 2017-12-28 MED ORDER — FENTANYL CITRATE (PF) 100 MCG/2ML IJ SOLN
INTRAMUSCULAR | Status: AC | PRN
  Administered 2017-12-28: 25 ug via INTRAVENOUS
  Administered 2017-12-28: 50 ug via INTRAVENOUS

## 2017-12-28 MED ORDER — SODIUM CHLORIDE 0.9 % IV SOLN: INTRAVENOUS | Status: DC

## 2017-12-28 MED ORDER — HYDROMORPHONE HCL 2 MG PO TABS
2.0000 mg | ORAL_TABLET | ORAL | Status: DC | PRN
  Filled 2017-12-28: qty 1

## 2017-12-28 MED ORDER — MIDAZOLAM HCL 2 MG/2ML IJ SOLN
INTRAMUSCULAR | Status: AC
  Filled 2017-12-28: qty 4

## 2017-12-28 NOTE — Discharge Instructions (Addendum)
Liver Biopsy, Care After °These instructions give you information on caring for yourself after your procedure. Your doctor may also give you more specific instructions. Call your doctor if you have any problems or questions after your procedure. °Follow these instructions at home: °· Rest at home for 1-2 days or as told by your doctor. °· Have someone stay with you for at least 24 hours. °· Do not do these things in the first 24 hours: °? Drive. °? Use machinery. °? Take care of other people. °? Sign legal documents. °? Take a bath or shower. °· There are many different ways to close and cover a cut (incision). For example, a cut can be closed with stitches, skin glue, or adhesive strips. Follow your doctor's instructions on: °? Taking care of your cut. °? Changing and removing your bandage (dressing). °? Removing whatever was used to close your cut. °· Do not drink alcohol in the first week. °· Do not lift more than 5 pounds or play contact sports for the first 2 weeks. °· Take medicines only as told by your doctor. For 1 week, do not take medicine that has aspirin in it or medicines like ibuprofen. °· Get your test results. °Contact a doctor if: °· A cut bleeds and leaves more than just a small spot of blood. °· A cut is red, puffs up (swells), or hurts more than before. °· Fluid or something else comes from a cut. °· A cut smells bad. °· You have a fever or chills. °Get help right away if: °· You have swelling, bloating, or pain in your belly (abdomen). °· You get dizzy or faint. °· You have a rash. °· You feel sick to your stomach (nauseous) or throw up (vomit). °· You have trouble breathing, feel short of breath, or feel faint. °· Your chest hurts. °· You have problems talking or seeing. °· You have trouble balancing or moving your arms or legs. °This information is not intended to replace advice given to you by your health care provider. Make sure you discuss any questions you have with your health care  provider. °Document Released: 02/19/2008 Document Revised: 10/18/2015 Document Reviewed: 07/08/2013 °Elsevier Interactive Patient Education © 2018 Elsevier Inc. ° ° ° ° °Moderate Conscious Sedation, Adult, Care After °These instructions provide you with information about caring for yourself after your procedure. Your health care provider may also give you more specific instructions. Your treatment has been planned according to current medical practices, but problems sometimes occur. Call your health care provider if you have any problems or questions after your procedure. °What can I expect after the procedure? °After your procedure, it is common: °· To feel sleepy for several hours. °· To feel clumsy and have poor balance for several hours. °· To have poor judgment for several hours. °· To vomit if you eat too soon. ° °Follow these instructions at home: °For at least 24 hours after the procedure: ° °· Do not: °? Participate in activities where you could fall or become injured. °? Drive. °? Use heavy machinery. °? Drink alcohol. °? Take sleeping pills or medicines that cause drowsiness. °? Make important decisions or sign legal documents. °? Take care of children on your own. °· Rest. °Eating and drinking °· Follow the diet recommended by your health care provider. °· If you vomit: °? Drink water, juice, or soup when you can drink without vomiting. °? Make sure you have little or no nausea before eating solid foods. °General instructions °·   Have a responsible adult stay with you until you are awake and alert. °· Take over-the-counter and prescription medicines only as told by your health care provider. °· If you smoke, do not smoke without supervision. °· Keep all follow-up visits as told by your health care provider. This is important. °Contact a health care provider if: °· You keep feeling nauseous or you keep vomiting. °· You feel light-headed. °· You develop a rash. °· You have a fever. °Get help right away  if: °· You have trouble breathing. °This information is not intended to replace advice given to you by your health care provider. Make sure you discuss any questions you have with your health care provider. °Document Released: 03/02/2013 Document Revised: 10/15/2015 Document Reviewed: 09/01/2015 °Elsevier Interactive Patient Education © 2018 Elsevier Inc. ° ° °

## 2017-12-28 NOTE — Progress Notes (Signed)
Client c/o nausea and no vomiting; client's husband suggested zofran and client refused; Pam Turpin,PA notified of complaint and no new orders; client states "I just want to go home'

## 2017-12-28 NOTE — Procedures (Signed)
  Procedure: [place] core liver bx   EBL:   minimal Complications:  none immediate  See full dictation in [facility].  Dillard Cannon MD Main # [phone] Pager  [phone]

## 2017-12-28 NOTE — H&P (Signed)
Chief Complaint: Patient was seen in consultation today for random liver core biopsy at the request of Rehman,Najeeb U  Referring Physician(s): Rehman,Najeeb U  Supervising Physician: Arne Cleveland  Patient Status: [facility] - Out-pt  History of Present Illness: Cynthia Donaldson is a 62 y.o. female   Primary biliary cholangitis- bx proven Elevated liver function tests [place] elastography 09/2017: ULTRASOUND HEPATIC ELASTOGRAPHY: Median hepatic shear wave velocity is calculated at 2.85 m/sec. Corresponding Metavir fibrosis score is Some F3 + F4. Risk of fibrosis is High.  Scheduled for random liver biopsy   Past Medical History:  Diagnosis Date  . Bile duct stenosis   . Hyperlipidemia   . Hypothyroidism   . PONV (postoperative nausea and vomiting)   . Sleep apnea     Past Surgical History:  Procedure Laterality Date  . anal fissure repair    . CHOLECYSTECTOMY    . COLONOSCOPY N/A 12/02/2017   Procedure: COLONOSCOPY;  Surgeon: Rogene Houston, MD;  Location: [facility] SUITE;  Service: Endoscopy;  Laterality: N/A;  1030  . ERCP      X3  . MYOMECTOMY    . TONSILLECTOMY      Allergies: Codeine  Medications: Prior to Admission medications   Medication Sig Start Date End Date Taking? Authorizing Provider  buPROPion (WELLBUTRIN XL) 150 MG 24 hr tablet Take 450 mg by mouth daily.     Yes [provider]  ezetimibe (ZETIA) 10 MG tablet Take 10 mg by mouth daily.   Yes [provider]  levothyroxine (SYNTHROID, LEVOTHROID) 150 MCG tablet Take 150 mcg by mouth daily before breakfast.    Yes [provider]  phentermine 30 MG capsule Take 30 mg by mouth daily. 11/10/17  Yes [provider]  ursodiol (ACTIGALL) 500 MG tablet Take 1,500 mg by mouth daily.    Yes [provider]     Family History  Problem Relation Age of Onset  . Heart disease Unknown   . Arthritis Unknown   . Cancer Unknown   . Hypertension Father   . Cancer  Father        stomach  . Heart disease Mother   . Colon cancer Brother   . Suicidality Sister   . Suicidality Brother   . Hypertension Brother   . Arthritis Brother   . Hypertension Brother   . Arthritis Brother   . Hypertension Brother   . Arthritis Brother     Social History   Socioeconomic History  . Marital status: Married    Spouse name: Not on file  . Number of children: Not on file  . Years of education: Not on file  . Highest education level: Not on file  Occupational History  . Occupation: Agricultural engineer  Social Needs  . Financial resource strain: Not on file  . Food insecurity:    Worry: Not on file    Inability: Not on file  . Transportation needs:    Medical: Not on file    Non-medical: Not on file  Tobacco Use  . Smoking status: Current Every Day Smoker    Types: Cigarettes  . Smokeless tobacco: Never Used  . Tobacco comment: 4/day  Substance and Sexual Activity  . Alcohol use: Not Currently  . Drug use: No  . Sexual activity: Yes    Birth control/protection: Post-menopausal  Lifestyle  . Physical activity:    Days per week: Not on file    Minutes per session: Not on file  . Stress:  Not on file  Relationships  . Social connections:    Talks on phone: Not on file    Gets together: Not on file    Attends religious service: Not on file    Active member of club or organization: Not on file    Attends meetings of clubs or organizations: Not on file    Relationship status: Not on file  Other Topics Concern  . Not on file  Social History Narrative  . Not on file    Review of Systems: A 12 point ROS discussed and pertinent positives are indicated in the HPI above.  All other systems are negative.  Review of Systems  Constitutional: Negative for activity change, fatigue and fever.  Respiratory: Negative for cough and shortness of breath.   Cardiovascular: Negative for chest pain.  Gastrointestinal: Negative for abdominal distention and abdominal pain.   Neurological: Negative for weakness.  Psychiatric/Behavioral: Negative for behavioral problems and confusion.    Vital Signs: BP (!) 127/93   Pulse 64   Temp 98.2 F (36.8 C) (Oral)   Resp 16   Ht 5\' 9"  (1.753 m)   Wt 218 lb (98.9 kg)   SpO2 98%   BMI 32.19 kg/m   Physical Exam  Constitutional: She is oriented to person, place, and time.  Cardiovascular: Normal rate, regular rhythm and normal heart sounds.  Pulmonary/Chest: Effort normal and breath sounds normal.  Abdominal: Soft. Bowel sounds are normal. She exhibits no distension. There is no tenderness.  Musculoskeletal: Normal range of motion.  Neurological: She is alert and oriented to person, place, and time.  Skin: Skin is warm and dry.  Psychiatric: She has a normal mood and affect. Her behavior is normal. Judgment and thought content normal.  Nursing note and vitals reviewed.   Imaging: No results found.  Labs:  CBC: No results for input(s): WBC, HGB, HCT, PLT in the last 8760 hours.  COAGS: No results for input(s): INR, APTT in the last 8760 hours.  BMP: No results for input(s): NA, K, CL, CO2, GLUCOSE, BUN, CALCIUM, CREATININE, GFRNONAA, GFRAA in the last 8760 hours.  Invalid input(s): CMP  LIVER FUNCTION TESTS: No results for input(s): BILITOT, AST, ALT, ALKPHOS, PROT, ALBUMIN in the last 8760 hours.  TUMOR MARKERS: No results for input(s): AFPTM, CEA, CA199, CHROMGRNA in the last 8760 hours.  Assessment and Plan:  Primary biliary cholangitis Elevated liver function tests Scheduled for liver core biopsy Risks and benefits discussed with the patient including, but not limited to bleeding, infection, damage to adjacent structures or low yield requiring additional tests.  All of the patient's questions were answered, patient is agreeable to proceed. Consent signed and in chart.   Thank you for this interesting consult.  I greatly enjoyed meeting Cynthia Donaldson and look forward to participating  in their care.  A copy of this report was sent to the requesting provider on this date.  Electronically Signed: Lavonia Drafts, PA-C 12/28/2017, 7:05 AM   I spent a total of  30 Minutes   in face to face in clinical consultation, greater than 50% of which was counseling/coordinating care for liver core biopsy

## 2018-01-06 MED FILL — PHENTERMINE 30 MG CAPSULE: 30 | 90 days supply | Qty: 90 | Fill #0

## 2018-01-14 MED FILL — buPROPion HCL ER (XL) 150 M: 150 | 90 days supply | Qty: 270 | Fill #0

## 2018-01-14 MED FILL — LEVOTHYROXINE 150 MCG TAB: 150 | 90 days supply | Qty: 90 | Fill #1

## 2018-02-15 MED FILL — EZETIMIBE 10 MG TABLET: 10 | 90 days supply | Qty: 90 | Fill #1

## 2018-02-25 DIAGNOSIS — R945 Abnormal results of liver function studies: Secondary | ICD-10-CM | POA: Diagnosis not present

## 2018-02-25 DIAGNOSIS — E039 Hypothyroidism, unspecified: Secondary | ICD-10-CM | POA: Diagnosis not present

## 2018-02-25 DIAGNOSIS — G4733 Obstructive sleep apnea (adult) (pediatric): Secondary | ICD-10-CM | POA: Diagnosis not present

## 2018-02-25 DIAGNOSIS — Z79899 Other long term (current) drug therapy: Secondary | ICD-10-CM | POA: Diagnosis not present

## 2018-03-04 DIAGNOSIS — E039 Hypothyroidism, unspecified: Secondary | ICD-10-CM | POA: Diagnosis not present

## 2018-03-04 DIAGNOSIS — K760 Fatty (change of) liver, not elsewhere classified: Secondary | ICD-10-CM | POA: Diagnosis not present

## 2018-03-04 DIAGNOSIS — Z23 Encounter for immunization: Secondary | ICD-10-CM | POA: Diagnosis not present

## 2018-03-08 MED FILL — URSODIOL 500 MG TABLET: 500 | 90 days supply | Qty: 270 | Fill #2

## 2018-04-05 MED FILL — PHENTERMINE 30 MG CAPSULE: 30 | 90 days supply | Qty: 90 | Fill #1

## 2018-04-19 MED FILL — buPROPion HCL ER (XL) 150 M: 150 | 90 days supply | Qty: 270 | Fill #1

## 2018-04-19 MED FILL — LEVOTHYROXINE 150 MCG TAB: 150 | 90 days supply | Qty: 90 | Fill #2

## 2018-05-31 DIAGNOSIS — G4733 Obstructive sleep apnea (adult) (pediatric): Secondary | ICD-10-CM | POA: Diagnosis not present

## 2018-06-01 DIAGNOSIS — R945 Abnormal results of liver function studies: Secondary | ICD-10-CM | POA: Diagnosis not present

## 2018-06-08 DIAGNOSIS — K760 Fatty (change of) liver, not elsewhere classified: Secondary | ICD-10-CM | POA: Diagnosis not present

## 2018-06-08 DIAGNOSIS — K743 Primary biliary cirrhosis: Secondary | ICD-10-CM | POA: Diagnosis not present

## 2018-06-08 MED FILL — EZETIMIBE 10 MG TABS: 10 | 90 days supply | Qty: 90 | Fill #2

## 2018-07-07 MED FILL — URSODIOL 500 MG TABLET: 500 | 90 days supply | Qty: 270 | Fill #3

## 2018-08-03 DIAGNOSIS — G4733 Obstructive sleep apnea (adult) (pediatric): Secondary | ICD-10-CM | POA: Diagnosis not present

## 2018-08-03 MED FILL — PHENTERMINE 30 MG CAPSULE: 30 | 90 days supply | Qty: 90 | Fill #0

## 2018-08-26 MED FILL — EZETIMIBE 10 MG TABS: 10 | 90 days supply | Qty: 90 | Fill #0

## 2018-08-26 MED FILL — LEVOTHYROXINE 150 MCG TAB: 150 | 90 days supply | Qty: 90 | Fill #0

## 2018-08-26 MED FILL — buPROPion HCL ER (XL) 150 M: 150 | 90 days supply | Qty: 270 | Fill #0

## 2018-10-07 DIAGNOSIS — K743 Primary biliary cirrhosis: Secondary | ICD-10-CM | POA: Diagnosis not present

## 2018-10-12 MED FILL — URSODIOL 500 MG TABLET: 500 | 90 days supply | Qty: 270 | Fill #0

## 2018-11-19 MED FILL — buPROPion HCL ER (XL) 150 M: 150 | 90 days supply | Qty: 270 | Fill #1

## 2018-11-19 MED FILL — LEVOTHYROXINE 150 MCG TAB: 150 | 90 days supply | Qty: 90 | Fill #0

## 2018-11-19 MED FILL — EZETIMIBE 10 MG TABS: 10 | 90 days supply | Qty: 90 | Fill #0

## 2018-11-30 MED FILL — PHENTERMINE HCL 30 MG CAP: 30 | 90 days supply | Qty: 90 | Fill #0

## 2018-12-03 DIAGNOSIS — G4733 Obstructive sleep apnea (adult) (pediatric): Secondary | ICD-10-CM | POA: Diagnosis not present

## 2018-12-14 ENCOUNTER — Ambulatory Visit (INDEPENDENT_AMBULATORY_CARE_PROVIDER_SITE_OTHER): Payer: 59 | Admitting: Internal Medicine

## 2019-01-25 ENCOUNTER — Encounter (INDEPENDENT_AMBULATORY_CARE_PROVIDER_SITE_OTHER): Payer: Self-pay | Admitting: Internal Medicine

## 2019-01-25 ENCOUNTER — Ambulatory Visit (INDEPENDENT_AMBULATORY_CARE_PROVIDER_SITE_OTHER): Payer: 59 | Admitting: Internal Medicine

## 2019-01-25 ENCOUNTER — Other Ambulatory Visit: Payer: Self-pay

## 2019-01-25 VITALS — BP 126/85 | HR 98 | Temp 98.1°F | Ht 67.5 in | Wt 226.3 lb

## 2019-01-25 DIAGNOSIS — R1011 Right upper quadrant pain: Secondary | ICD-10-CM | POA: Diagnosis not present

## 2019-01-25 DIAGNOSIS — K7581 Nonalcoholic steatohepatitis (NASH): Secondary | ICD-10-CM

## 2019-01-25 DIAGNOSIS — K743 Primary biliary cirrhosis: Secondary | ICD-10-CM

## 2019-01-25 MED ORDER — FAMOTIDINE 20 MG PO TABS: 20.0000 mg | ORAL_TABLET | Freq: Every evening | ORAL | Status: DC | PRN

## 2019-01-25 NOTE — Patient Instructions (Signed)
LFTs to be done with 6 to 12 hours of RUQ pain. Pepcid OTC 20 mg by mouth daily at bedtime as needed

## 2019-01-25 NOTE — Progress Notes (Signed)
Presenting complaint;  Follow-up for chronic liver disease.  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  She had stage II disease.  She has been maintained on Urso ever since. She was last evaluated in May 2019.  Her transaminases were elevated.  She had ultrasound with elastography.  Liver was echogenic but without contour changes to suggest cirrhosis.  Portal vein was patent with normal directional blood flow.  However elastography suggested F3/F4 disease.  EGD in July 2019 was negative for esophageal varices or portal hypertensive gastropathy.  She had erosive gastritis single polyp which was hyperplastic.  Biopsy from GE junction revealed changes of GERD but no Barrett's.  Colonoscopy revealed sigmoid diverticulosis and 2 small polyps which were tubular adenomas.   She went on to have ultrasound-guided liver biopsy in August last year.  She had stage II disease and she also had moderate steatosis. Patient was advised to increase physical activity and and weight loss.  She was also advised to cut back on alcohol intake.  [name] states she lost weight down to 207 pounds but since onset of COVID-19 pandemic her physical activity has decreased and she has gained 19 pounds.  She denies pruritus or fatigue.  She continues to complain of intermittent right upper quadrant pain.  Sometimes she wakes up with this pain.  She has nausea but no vomiting.  She has at least one episode per week.  Her appetite is good.  She is taking Urso all at the same time.  She has 1-3 soft stools daily.  She is not having diarrhea anymore.  She says she has had very few drinks of alcohol in the last 1 year.  Current Medications: Outpatient Encounter Medications as of 01/25/2019  Medication Sig  . buPROPion (WELLBUTRIN XL) 150 MG 24 hr tablet Take 450 mg by mouth daily.    Marland Kitchen  ezetimibe (ZETIA) 10 MG tablet Take 10 mg by mouth daily.  Marland Kitchen levothyroxine (SYNTHROID, LEVOTHROID) 150 MCG tablet Take 150 mcg by mouth daily before breakfast.   . phentermine 30 MG capsule Take 30 mg by mouth daily.  . ursodiol (ACTIGALL) 500 MG tablet Take 1,500 mg by mouth daily.    Facility-Administered Encounter Medications as of 01/25/2019  Medication  . methylPREDNISolone acetate (DEPO-MEDROL) injection 40 mg     Objective: Blood pressure 126/85, pulse 98, temperature 98.1 F (36.7 C), temperature source Oral, height 5' 7.5" (1.715 m), weight 226 lb 4.8 oz (102.6 kg). Patient is alert and in no acute distress. Conjunctiva is pink. Sclera is nonicteric Oropharyngeal mucosa is normal. No neck masses or thyromegaly noted. Cardiac exam with regular rhythm normal S1 and S2. No murmur or gallop noted. Lungs are clear to auscultation. Abdomen is full.  On palpation is soft and nontender.  No organomegaly or masses. No LE edema or clubbing noted.  Labs/studies Results: Lab data from 06/01/2018 Bilirubin 0.8, AP 144, AST 26, ALT 29, total protein 7.2 with albumin 4.5.  Assessment:  #1.  Primary biliary cholangitis.  She was diagnosed 14 years ago.  She has been maintained on Urso.  It does not appear that her disease has progressed.  It was stage II at the time of diagnosis in October 2006 and liver biopsy last year reveals stage II disease.  Therefore her disease for over estimated on elastography.  She is definitely at risk for progressive disease given  that she also has fatty liver.  #2.  Nonalcoholic steatohepatitis well documented her liver biopsy of August 2019.  Even though she has gained 19 pounds since COVID-19 pandemic started she still has lost 15 pounds since her last visit.  She needs to pick back her daily physical activity and must continue lose weight.  #3.  Right upper quadrant abdominal pain.  She has a history of choledocholithiasis with prior therapeutic ERCPs.  Pain  could be biliary colic IBS or dyspepsia.  #4.  History of colonic adenomas and family history of CRC in first-degree relative.  She is up-to-date on screening/surveillance schedule.  Next colonoscopy in August 2024.   Plan:  Famotidine OTC 20 to 40 mg p.o. nightly.  She will try 20 mg daily at bedtime for 1 to 2 weeks if it does not help she will try 40 mg. LFTs if she has another episode of significant right upper quadrant pain.  Blood work should be done within 6 to 12 hours of onset of pain. She will have her schedule blood work with Dr. Willey Blade on her upcoming appointment. Office visit in 1 year.

## 2019-02-04 DIAGNOSIS — E669 Obesity, unspecified: Secondary | ICD-10-CM | POA: Diagnosis not present

## 2019-02-04 DIAGNOSIS — K743 Primary biliary cirrhosis: Secondary | ICD-10-CM | POA: Diagnosis not present

## 2019-02-04 DIAGNOSIS — E039 Hypothyroidism, unspecified: Secondary | ICD-10-CM | POA: Diagnosis not present

## 2019-02-04 DIAGNOSIS — E785 Hyperlipidemia, unspecified: Secondary | ICD-10-CM | POA: Diagnosis not present

## 2019-02-04 DIAGNOSIS — Z79899 Other long term (current) drug therapy: Secondary | ICD-10-CM | POA: Diagnosis not present

## 2019-02-07 DIAGNOSIS — K760 Fatty (change of) liver, not elsewhere classified: Secondary | ICD-10-CM | POA: Diagnosis not present

## 2019-02-07 DIAGNOSIS — E785 Hyperlipidemia, unspecified: Secondary | ICD-10-CM | POA: Diagnosis not present

## 2019-02-07 DIAGNOSIS — E039 Hypothyroidism, unspecified: Secondary | ICD-10-CM | POA: Diagnosis not present

## 2019-02-11 MED FILL — LEVOTHYROXINE 150 MCG TAB: 150 | 90 days supply | Qty: 90 | Fill #1

## 2019-02-11 MED FILL — EZETIMIBE 10 MG TABS: 10 | 90 days supply | Qty: 90 | Fill #1

## 2019-02-16 MED FILL — buPROPion HCL ER (XL) 150 M: 150 | 90 days supply | Qty: 270 | Fill #0

## 2019-02-28 MED FILL — URSODIOL 500 MG TABLET: 500 | 90 days supply | Qty: 270 | Fill #1

## 2019-02-28 MED FILL — LEVOTHYROXINE 150 MCG TAB: 150 | 90 days supply | Qty: 90 | Fill #1

## 2019-02-28 MED FILL — buPROPion HCL ER (XL) 150 M: 150 | 90 days supply | Qty: 270 | Fill #0

## 2019-02-28 MED FILL — EZETIMIBE 10 MG TABS: 10 | 90 days supply | Qty: 90 | Fill #1

## 2019-04-18 IMAGING — MG DIGITAL SCREENING BILATERAL MAMMOGRAM WITH TOMO AND CAD
6 of 9 series · 6 of 25 positions shown · non-contrast
Comparison: Previous exam(s).

CLINICAL DATA: Screening.

EXAM:
DIGITAL SCREENING BILATERAL MAMMOGRAM WITH TOMO AND CAD

[L CC (1 of 2)]
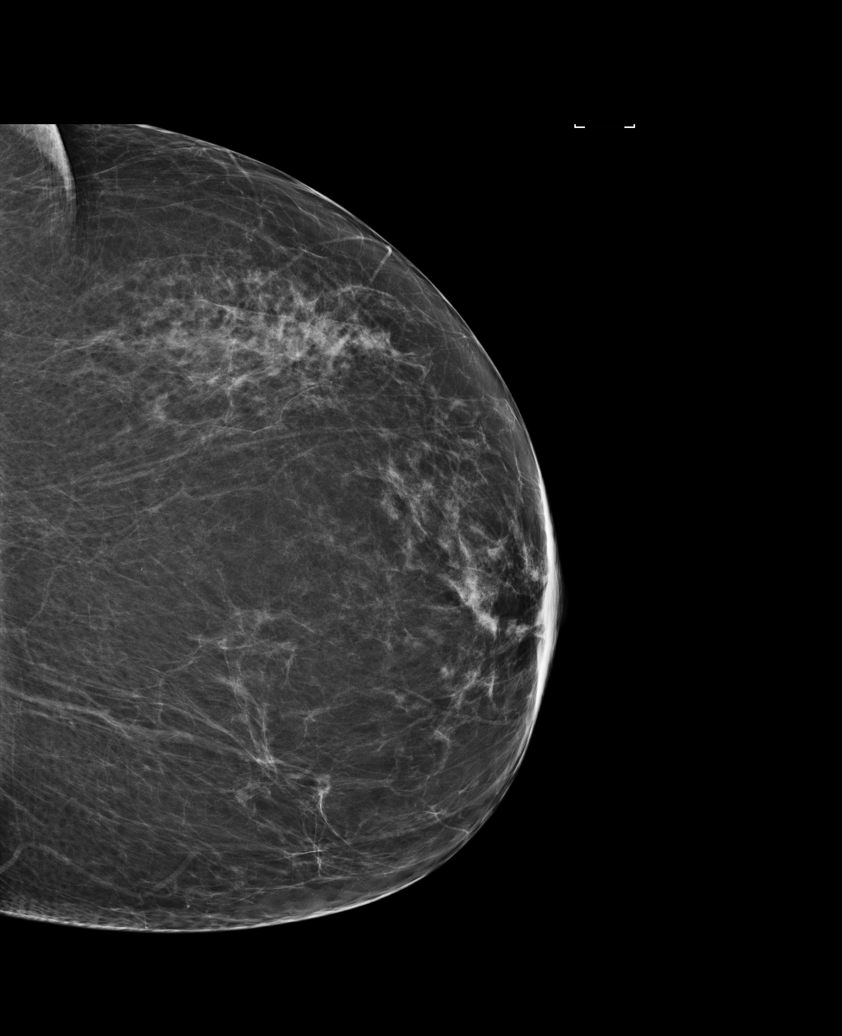

[R CC]
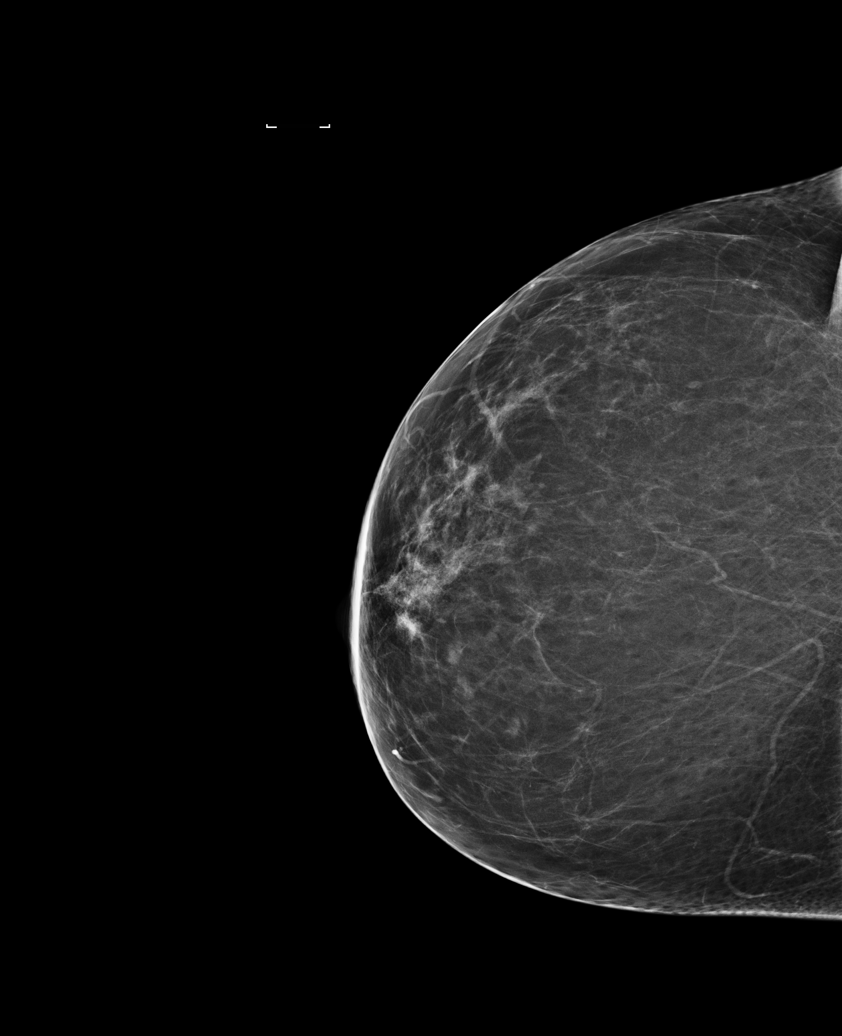

[L CC (2 of 2)]
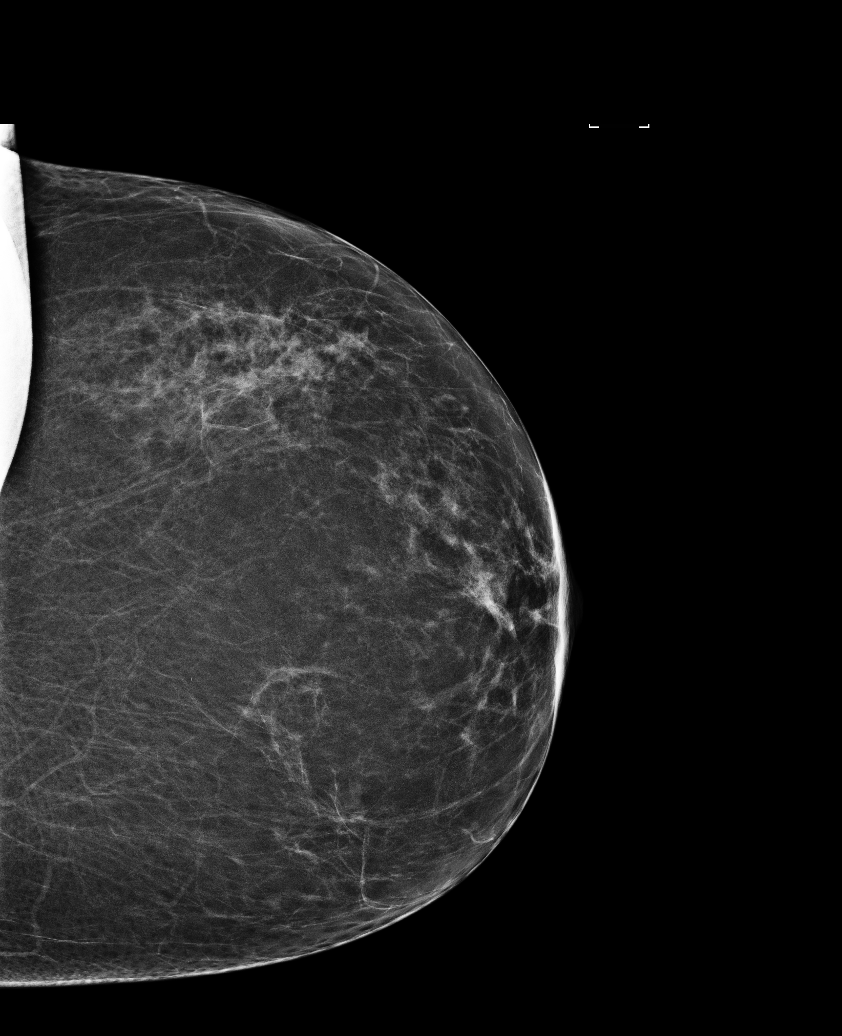

[R MLO]
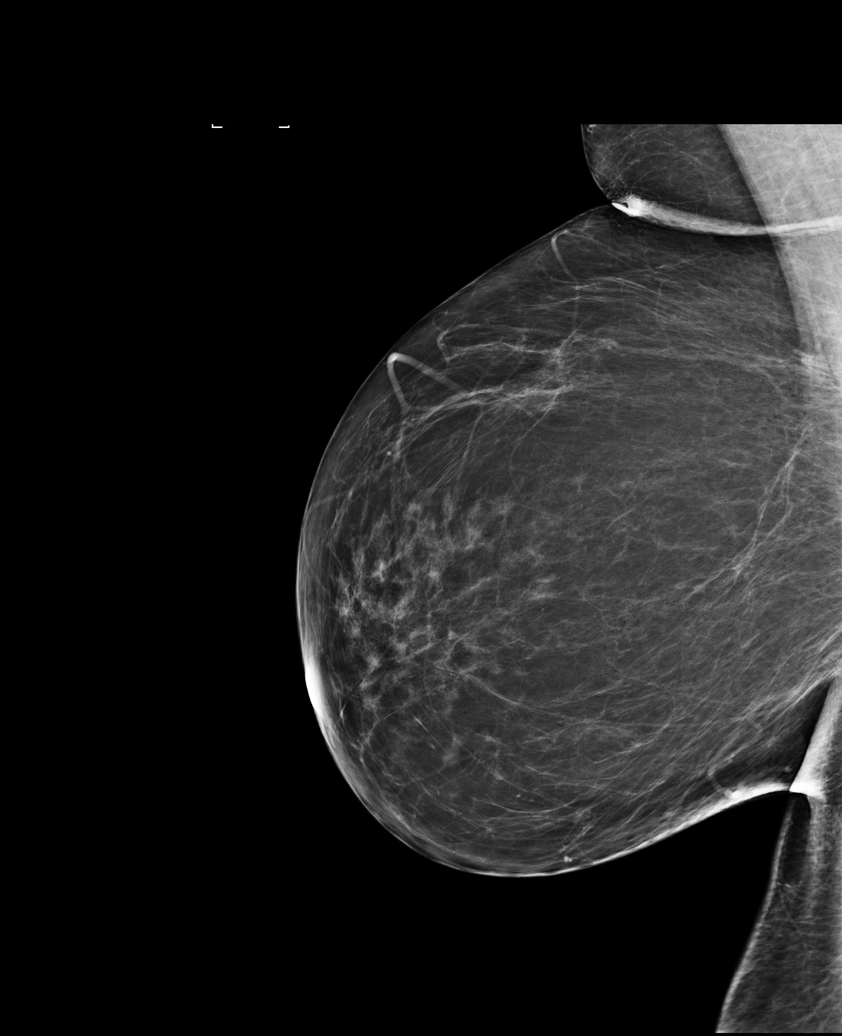

[L MLO]
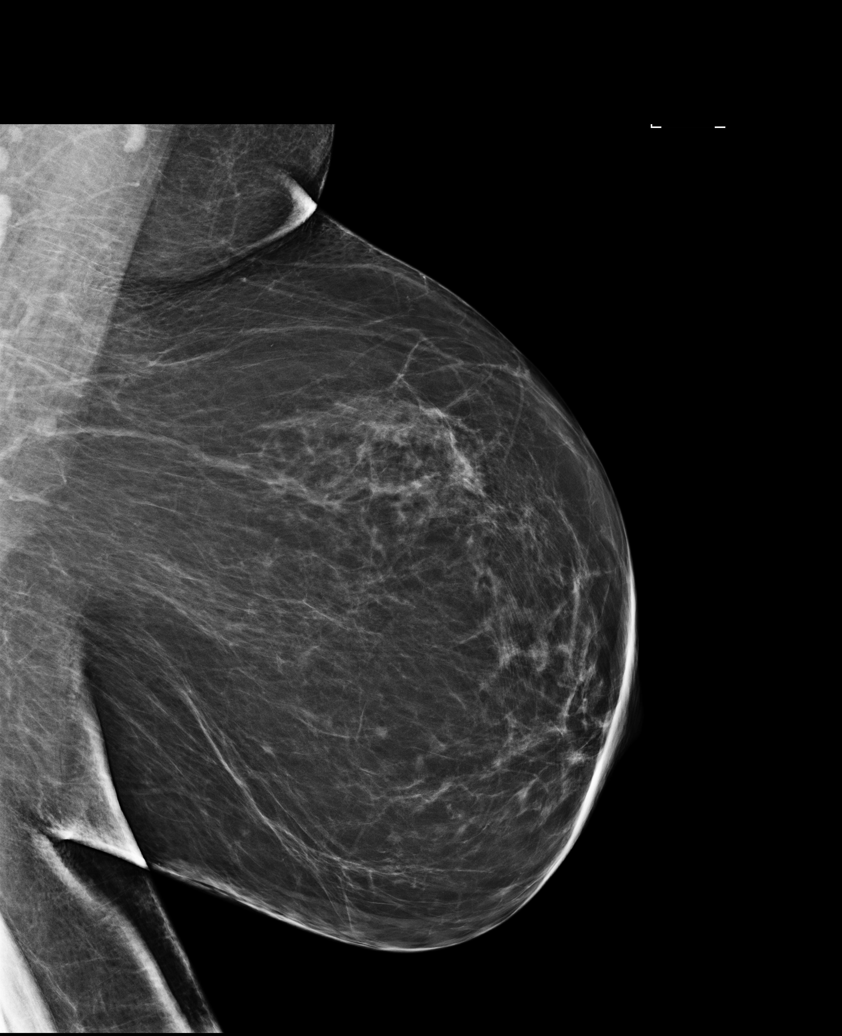

[L CC tomo · tomo slice 37/73.0]
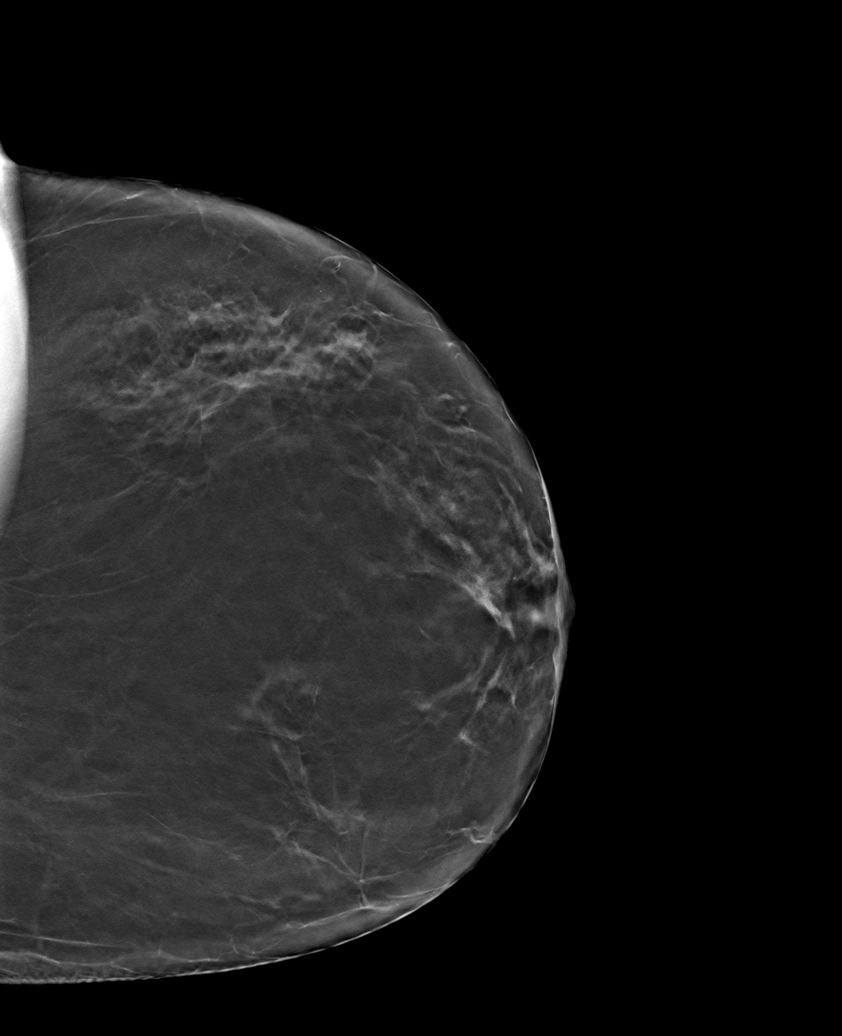

[6 of 25 positions shown; findings below may reference images not displayed]

ACR Breast Density Category b: There are scattered areas of
fibroglandular density.
FINDINGS: There are no findings suspicious for malignancy. Images were
processed with CAD.
IMPRESSION: No mammographic evidence of malignancy. A result letter of this
screening mammogram will be mailed directly to the patient.

RECOMMENDATION:
Screening mammogram in one year. (Code:[id number])

BI-RADS CATEGORY  1: Negative.

## 2019-04-20 DIAGNOSIS — G4733 Obstructive sleep apnea (adult) (pediatric): Secondary | ICD-10-CM | POA: Diagnosis not present

## 2019-05-23 MED FILL — EZETIMIBE 10 MG TABS: 10 | 90 days supply | Qty: 90 | Fill #2

## 2019-05-23 MED FILL — LEVOTHYROXINE SODIUM 150 MC: 150 | 90 days supply | Qty: 90 | Fill #2

## 2019-06-13 DIAGNOSIS — K743 Primary biliary cirrhosis: Secondary | ICD-10-CM | POA: Diagnosis not present

## 2019-06-13 DIAGNOSIS — Z23 Encounter for immunization: Secondary | ICD-10-CM | POA: Diagnosis not present

## 2019-06-13 DIAGNOSIS — G4733 Obstructive sleep apnea (adult) (pediatric): Secondary | ICD-10-CM | POA: Diagnosis not present

## 2019-06-13 MED FILL — PHENTERMINE 30 MG CAPSULE: 30 | 90 days supply | Qty: 90 | Fill #0

## 2019-07-21 MED FILL — URSODIOL 500 MG TABLET: 500 | 90 days supply | Qty: 270 | Fill #2

## 2019-07-29 DIAGNOSIS — G4733 Obstructive sleep apnea (adult) (pediatric): Secondary | ICD-10-CM | POA: Diagnosis not present

## 2019-08-22 MED FILL — EZETIMIBE 10 MG TABS: 10 | 90 days supply | Qty: 90 | Fill #3

## 2019-09-14 DIAGNOSIS — E669 Obesity, unspecified: Secondary | ICD-10-CM | POA: Diagnosis not present

## 2019-09-14 DIAGNOSIS — K760 Fatty (change of) liver, not elsewhere classified: Secondary | ICD-10-CM | POA: Diagnosis not present

## 2019-10-25 DIAGNOSIS — G4733 Obstructive sleep apnea (adult) (pediatric): Secondary | ICD-10-CM | POA: Diagnosis not present

## 2019-11-21 ENCOUNTER — Other Ambulatory Visit (HOSPITAL_COMMUNITY): Payer: Self-pay | Admitting: Internal Medicine

## 2019-11-21 MED FILL — EZETIMIBE 10 MG TABS: 10 | 90 days supply | Qty: 90 | Fill #0

## 2019-12-09 MED FILL — PHENTERMINE 30 MG CAPSULE: 30 | 90 days supply | Qty: 90 | Fill #1

## 2019-12-26 DIAGNOSIS — E669 Obesity, unspecified: Secondary | ICD-10-CM | POA: Diagnosis not present

## 2019-12-26 DIAGNOSIS — K7581 Nonalcoholic steatohepatitis (NASH): Secondary | ICD-10-CM | POA: Diagnosis not present

## 2019-12-28 ENCOUNTER — Other Ambulatory Visit (HOSPITAL_COMMUNITY): Payer: Self-pay | Admitting: Internal Medicine

## 2019-12-28 MED FILL — LEVOTHYROXINE SODIUM 150 MC: 150 | 90 days supply | Qty: 90 | Fill #0

## 2019-12-28 MED FILL — URSODIOL 500 MG TABLET: 500 | 90 days supply | Qty: 270 | Fill #0

## 2020-01-20 DIAGNOSIS — G4733 Obstructive sleep apnea (adult) (pediatric): Secondary | ICD-10-CM | POA: Diagnosis not present

## 2020-01-31 ENCOUNTER — Other Ambulatory Visit: Payer: Self-pay

## 2020-01-31 ENCOUNTER — Ambulatory Visit (INDEPENDENT_AMBULATORY_CARE_PROVIDER_SITE_OTHER): Payer: 59 | Admitting: Internal Medicine

## 2020-01-31 ENCOUNTER — Encounter (INDEPENDENT_AMBULATORY_CARE_PROVIDER_SITE_OTHER): Payer: Self-pay | Admitting: Internal Medicine

## 2020-01-31 VITALS — BP 149/87 | HR 73 | Temp 98.0°F | Ht 69.0 in | Wt 257.0 lb

## 2020-01-31 DIAGNOSIS — K743 Primary biliary cirrhosis: Secondary | ICD-10-CM | POA: Diagnosis not present

## 2020-01-31 DIAGNOSIS — R1013 Epigastric pain: Secondary | ICD-10-CM | POA: Diagnosis not present

## 2020-01-31 DIAGNOSIS — K7581 Nonalcoholic steatohepatitis (NASH): Secondary | ICD-10-CM | POA: Diagnosis not present

## 2020-01-31 MED ORDER — HYOSCYAMINE SULFATE SL 0.125 MG SL SUBL: 1.0000 | SUBLINGUAL_TABLET | Freq: Four times a day (QID) | SUBLINGUAL | 1 refills | Status: DC | PRN

## 2020-01-31 MED FILL — OSCIMIN 0.125 MG SUBL: 0.125 | 15 days supply | Qty: 60 | Fill #0

## 2020-01-31 NOTE — Progress Notes (Signed)
Presenting complaint;  Follow-up for chronic liver disease(PBC and fatty liver).  Database and subjective:  Cynthia Donaldson is 64 year old Caucasian female who was diagnosed with stage II primary biliary cholangitis back in October 2006 when she had liver biopsy at the time of cholecystectomy.  She has been maintained on Urso.  She had elastography in 2019 which suggested F3 and F4 disease.  She therefore had liver biopsy in August 2019 which revealed changes of primary biliary cholangitis as well as steatosis without steatohepatitis and it was stage II disease. Was recommended the patient increase physical activity and try to lose weight.  When she came last year she had lost weight and was feeling much better.  Her transaminases were normal. She is due for blood work by Dr. Willey Blade next month.  She is not walking or exercising like she was.  She feels pandemic messed up her routine.  She denies pruritus or fatigue.  She has gained 31 pounds since her last visit.  She is taking phentermine but she does not take it daily.  She may take it every other day.  Since she has gained weight she is trying to eat smaller portions and has quit drinking sodas. She complains of intermittent epigastric pain.  It occurs at least once a month and may last for 20 minutes.  When she is having pain she does not feel like doing anything.  She denies heartburn nausea or vomiting.  She also denies melena or rectal bleeding.  She says she drinks alcohol no more than 1 or 2 drinks a week or less often.  Current Medications: Outpatient Encounter Medications as of 01/31/2020  Medication Sig  . buPROPion (WELLBUTRIN XL) 150 MG 24 hr tablet Take 450 mg by mouth daily.    Marland Kitchen ezetimibe (ZETIA) 10 MG tablet Take 10 mg by mouth daily.  Marland Kitchen levothyroxine (SYNTHROID, LEVOTHROID) 150 MCG tablet Take 150 mcg by mouth daily before breakfast.   . phentermine 30 MG capsule Take 30 mg by mouth daily.  . ursodiol (ACTIGALL) 500 MG tablet Take 1,500 mg  by mouth daily.   . famotidine (PEPCID) 20 MG tablet Take 1 tablet (20 mg total) by mouth at bedtime as needed for heartburn or indigestion. (Patient not taking: Reported on 01/31/2020)   Facility-Administered Encounter Medications as of 01/31/2020  Medication  . methylPREDNISolone acetate (DEPO-MEDROL) injection 40 mg     Objective: Blood pressure (!) 149/87, pulse 73, temperature 98 F (36.7 C), temperature source Oral, height 5' 9"  (1.753 m), weight 257 lb (116.6 kg). Patient is alert and in no acute distress. She is wearing a mask. Conjunctiva is pink. Sclera is nonicteric Oropharyngeal mucosa is normal. No neck masses or thyromegaly noted. Cardiac exam with regular rhythm normal S1 and S2. No murmur or gallop noted. Lungs are clear to auscultation. Abdomen is full but soft and nontender with no organomegaly or masses. No LE edema or clubbing noted.  Labs/studies Results:  CBC Latest Ref Rng & Units 12/28/2017 06/30/2012 12/27/2010  WBC 4.0 - 10.5 K/uL 7.5 8.4 9.7  Hemoglobin 12.0 - 15.0 g/dL 14.8 14.3 14.8  Hematocrit 36 - 46 % 45.0 42.3 43.0  Platelets 150 - 400 K/uL 248 233 238    CMP Latest Ref Rng & Units 06/30/2012 12/27/2010 12/27/2010  Glucose 70 - 99 mg/dL 148(H) - 146(H)  BUN 6 - 23 mg/dL 20 - 22  Creatinine 0.50 - 1.10 mg/dL 0.95 - 0.82  Sodium 135 - 145 mEq/L 142 - 139  Potassium  3.5 - 5.1 mEq/L 3.8 - 4.0  Chloride 96 - 112 mEq/L 105 - 99  CO2 19 - 32 mEq/L 28 - 21  Calcium 8.4 - 10.5 mg/dL 9.1 - 10.0  Total Protein 6.0 - 8.3 g/dL 7.4 6.5 8.2  Total Bilirubin 0.3 - 1.2 mg/dL 0.3 1.0 2.3(H)  Alkaline Phos 39 - 117 U/L 132(H) 196(H) 276(H)  AST 0 - 37 U/L 24 218(H) 344(H)  ALT 0 - 35 U/L 45(H) 290(H) 247(H)    Hepatic Function Latest Ref Rng & Units 06/30/2012 12/27/2010 12/27/2010  Total Protein 6.0 - 8.3 g/dL 7.4 6.5 8.2  Albumin 3.5 - 5.2 g/dL 3.8 3.0(L) 4.0  AST 0 - 37 U/L 24 218(H) 344(H)  ALT 0 - 35 U/L 45(H) 290(H) 247(H)  Alk Phosphatase 39 - 117 U/L 132(H) 196(H)  276(H)  Total Bilirubin 0.3 - 1.2 mg/dL 0.3 1.0 2.3(H)  Bilirubin, Direct 0.0 - 0.3 mg/dL - 0.4(H) -    Lab data from 02/04/2019 Alkaline phosphatase 129 AST 27 and ALT 29.  Assessment:  #1.  Chronic liver disease.  She has biopsy-proven primary biliary cholangitis and maintained on Urso.  Liver biopsy 2 years ago also revealed steatosis.  She has gained 31 pounds since her last visit. She must go back to regular physical activity and decrease calorie intake in order to lose weight.  #2.  Episodic epigastric pain.  EGD in July 2019 was negative for peptic ulcer disease.  Etiology unclear.   Plan:  We will refer blood work to be done in October 2021. Patient advised to consider daily physical activity for 30 to 60 minutes or at least 4 times week. Hyoscyamine sublingual 1 tablet 4 times daily as needed. Office visit in 1 year.

## 2020-01-31 NOTE — Patient Instructions (Signed)
Can take 1-2 hyoscyamine sublingual with onset of pain.  Can take up to 4/day and no more. Please go back to regular physical activity as discussed.

## 2020-02-13 MED FILL — EZETIMIBE 10 MG TABS: 10 | 90 days supply | Qty: 90 | Fill #1

## 2020-02-19 IMAGING — US US ABDOMEN COMPLETE W/ ELASTOGRAPHY
1 series · 13 of 25 positions shown · non-contrast
Comparison: None.

CLINICAL DATA: Primary biliary cholangitis.  Prior cholecystectomy.



[Series 1: us abdomen complete w/ elastography · 0.22mm/px · 13 of 90 slices shown]
[im 1/90]
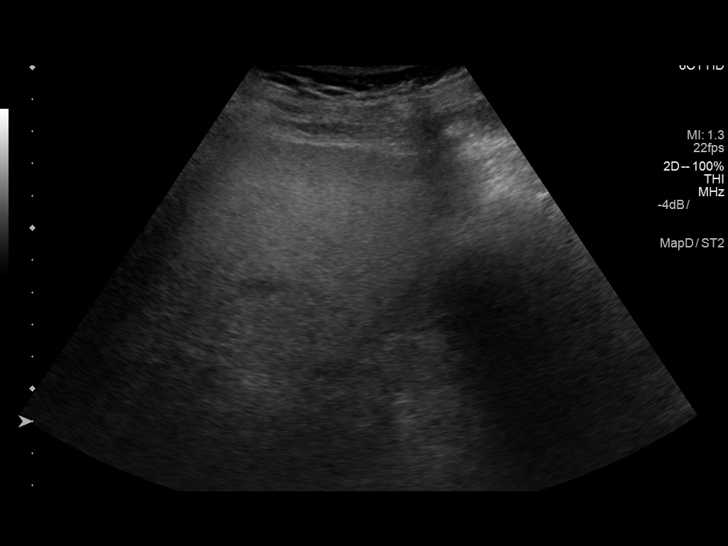
[im 8/90]
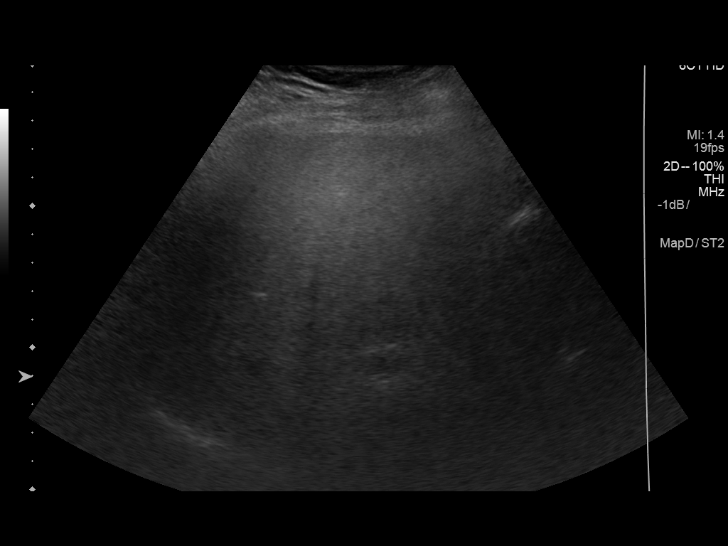
[im 15/90]
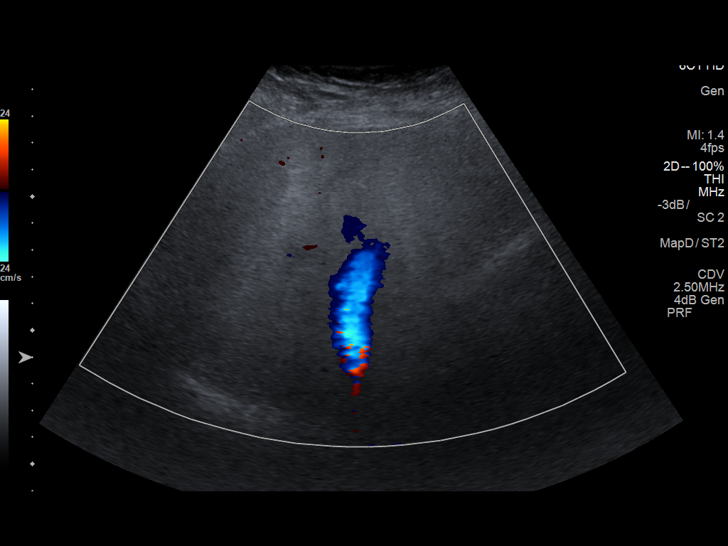
[im 23/90]
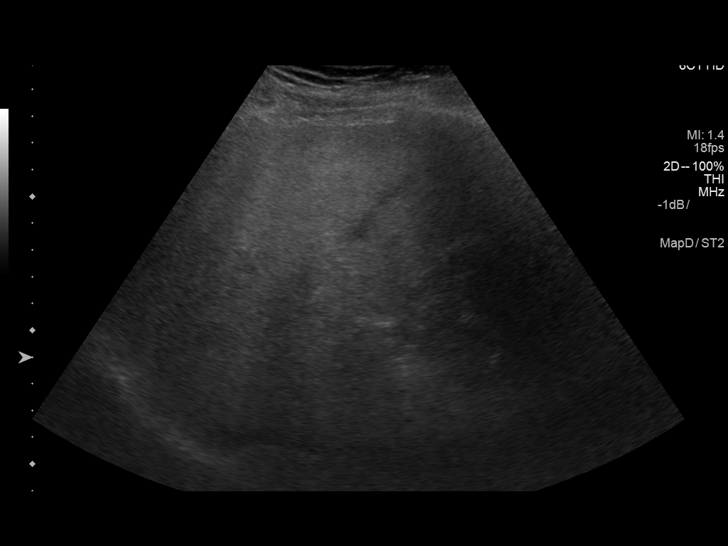
[im 30/90]
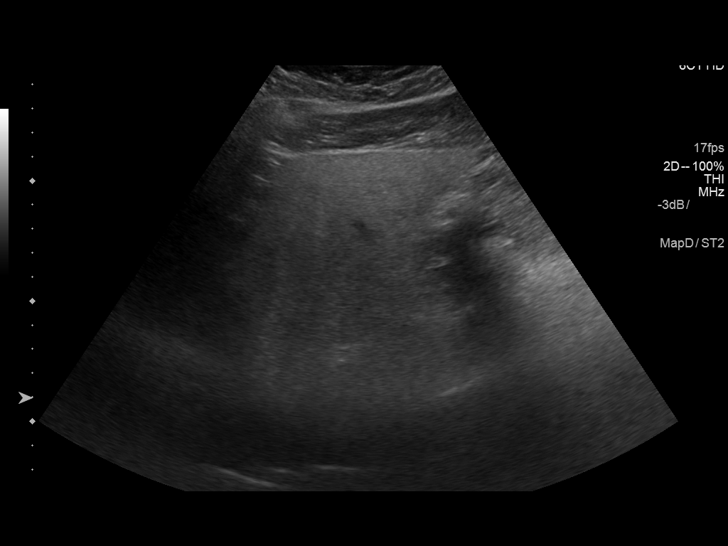
[im 38/90]
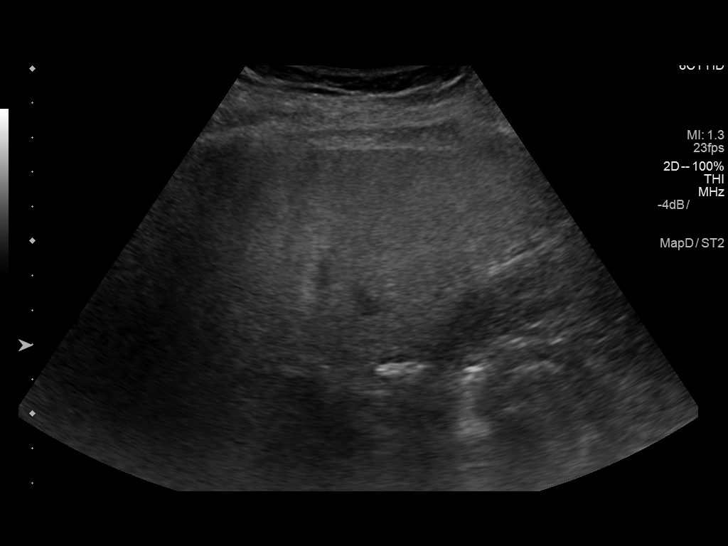
[im 45/90]
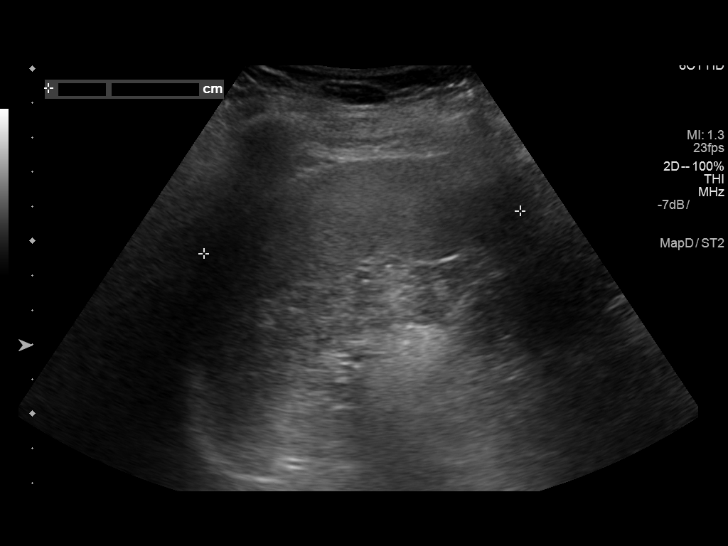
[im 52/90]
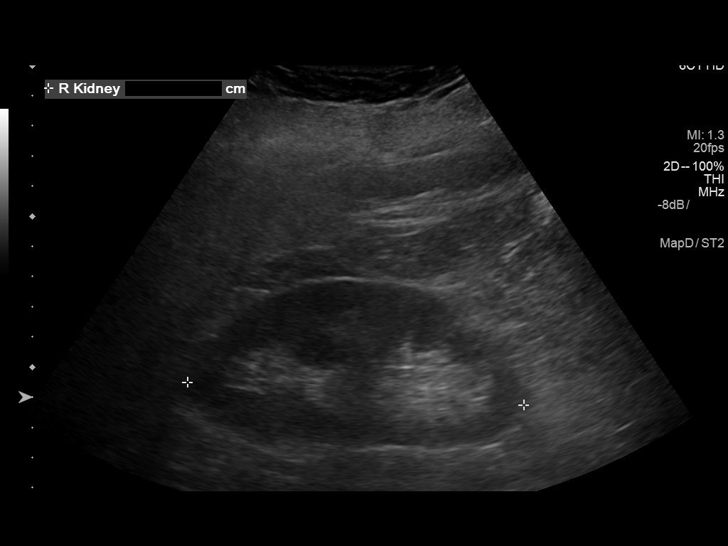
[im 60/90]
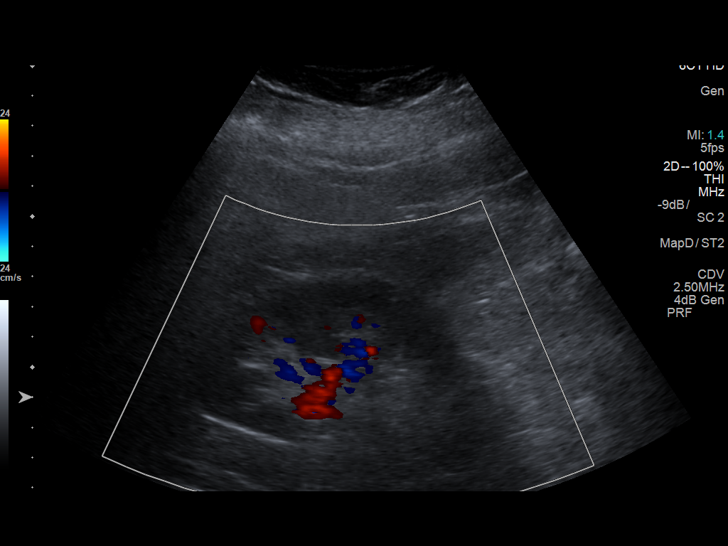
[im 67/90]
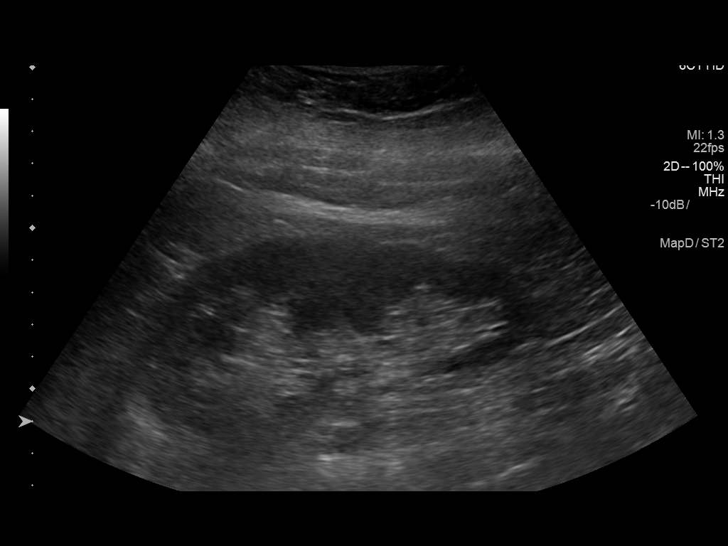
[im 75/90]
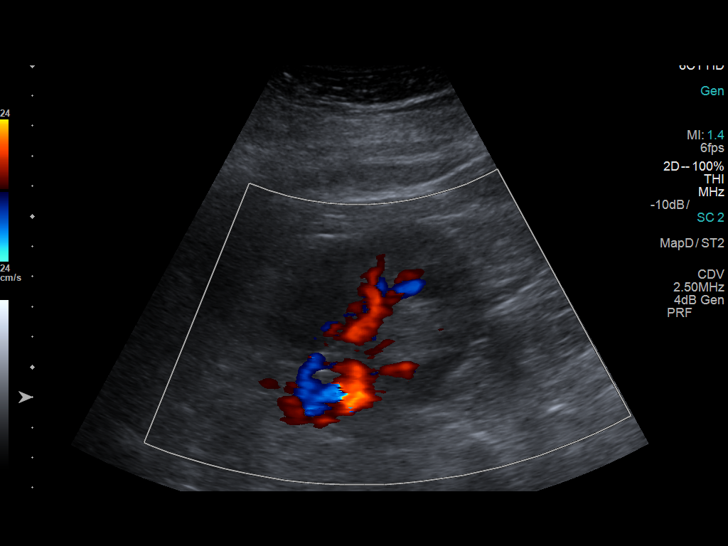
[im 82/90]
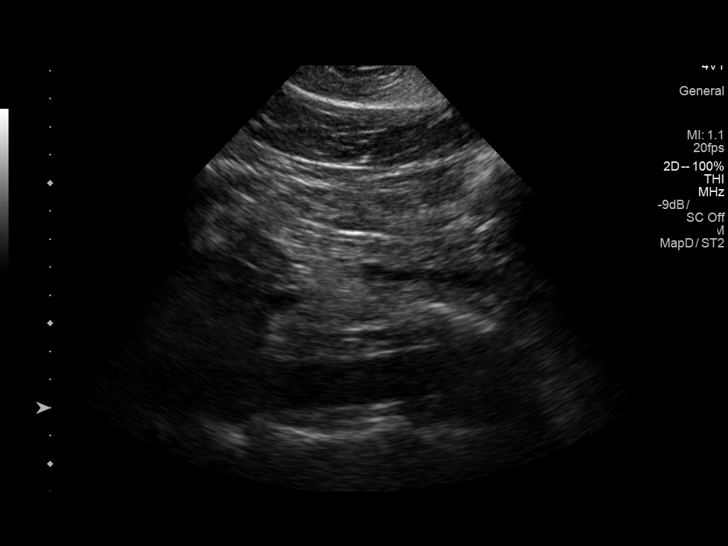
[im 90/90]
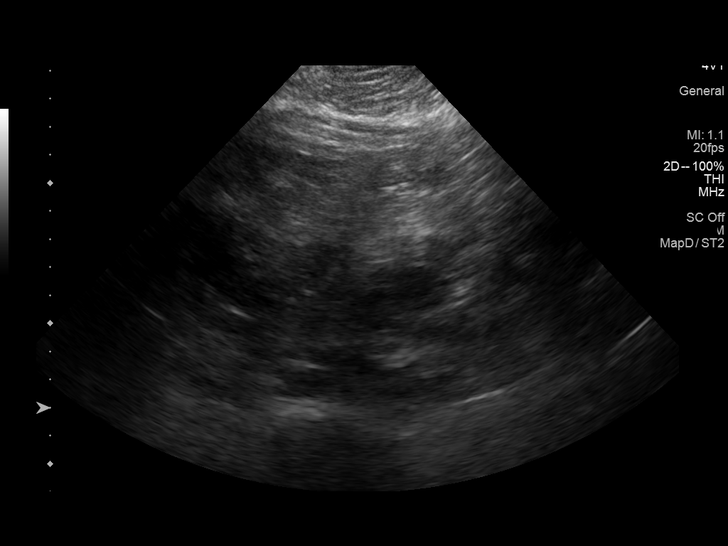

[13 of 25 positions shown; findings below may reference images not displayed]

FINDINGS: ULTRASOUND ABDOMEN

Gallbladder: Surgically absent.

Common bile duct: Diameter: Common bile duct is not visualized,
however no dilatation of intrahepatic bile ducts seen.

Liver: Diffusely increased echogenicity of the hepatic parenchyma,
consistent with hepatic steatosis. No focal mass lesion identified.
Portal vein is patent on color Doppler imaging with normal direction
of blood flow towards the liver.

IVC: No abnormality visualized.

Pancreas: Visualized portion unremarkable.

Spleen: Size and appearance within normal limits.

Right Kidney: Length: 11.2 cm. Echogenicity within normal limits. No
mass or hydronephrosis visualized.

Left Kidney: Length: 12.6 cm. Echogenicity within normal limits. No
mass or hydronephrosis visualized.

Abdominal aorta: No aneurysm visualized.

Other findings: None.

ULTRASOUND HEPATIC ELASTOGRAPHY

Device: Siemens Helix VTQ

Patient position: Left Lateral Decubitus

Transducer 4V1

Number of measurements: 10

Hepatic segment:  8

Median velocity:   2.85 m/sec

IQR:

IQR/Median velocity ratio:

Corresponding Metavir fibrosis score:  Some F3 + F4

Risk of fibrosis: High

Limitations of exam: None

Please note that abnormal shear wave velocities may also be
identified in clinical settings other than with hepatic fibrosis,
such as: acute hepatitis, elevated right heart and central venous
pressures including use of beta blockers, Froehlich disease
(Selecta Johann), infiltrative processes such as
mastocytosis/amyloidosis/infiltrative tumor, extrahepatic
cholestasis, in the post-prandial state, and liver transplantation.
Correlation with patient history, laboratory data, and clinical
condition recommended.
IMPRESSION: ULTRASOUND ABDOMEN:
Diffuse hepatic steatosis.  No liver mass visualized.

Prior cholecystectomy. Common bile duct not visualized, but no
intrahepatic biliary dilatation seen.

ULTRASOUND HEPATIC ELASTOGRAPHY:

Median hepatic shear wave velocity is calculated at 2.85 m/sec.

Corresponding Metavir fibrosis score is Some F3 + F4.

Risk of fibrosis is High.

Follow-up: Follow up advised

## 2020-04-02 DIAGNOSIS — J019 Acute sinusitis, unspecified: Secondary | ICD-10-CM | POA: Diagnosis not present

## 2020-05-04 ENCOUNTER — Other Ambulatory Visit (HOSPITAL_COMMUNITY): Payer: Self-pay | Admitting: Internal Medicine

## 2020-05-04 MED FILL — buPROPion HCL ER (XL) 150 M: 150 | 90 days supply | Qty: 270 | Fill #0

## 2020-05-14 MED FILL — EZETIMIBE 10 MG TABS: 10 | 90 days supply | Qty: 90 | Fill #2

## 2020-05-31 DIAGNOSIS — G4733 Obstructive sleep apnea (adult) (pediatric): Secondary | ICD-10-CM | POA: Diagnosis not present

## 2020-07-03 DIAGNOSIS — H903 Sensorineural hearing loss, bilateral: Secondary | ICD-10-CM | POA: Diagnosis not present

## 2020-07-18 MED FILL — URSODIOL 500 MG TABLET: 500 | 90 days supply | Qty: 270 | Fill #1

## 2020-07-21 MED FILL — LEVOTHYROXINE SODIUM 150 MC: 150 | 90 days supply | Qty: 90 | Fill #1

## 2020-08-16 ENCOUNTER — Other Ambulatory Visit (HOSPITAL_BASED_OUTPATIENT_CLINIC_OR_DEPARTMENT_OTHER): Payer: Self-pay

## 2020-10-29 ENCOUNTER — Other Ambulatory Visit (HOSPITAL_COMMUNITY): Payer: Self-pay

## 2020-10-29 MED FILL — Bupropion HCl Tab ER 24HR 150 MG: ORAL | 90 days supply | Qty: 270 | Fill #0 | Status: AC

## 2020-10-29 MED FILL — Levothyroxine Sodium Tab 150 MCG: ORAL | 90 days supply | Qty: 90 | Fill #0 | Status: AC

## 2020-11-01 DIAGNOSIS — G4733 Obstructive sleep apnea (adult) (pediatric): Secondary | ICD-10-CM | POA: Diagnosis not present

## 2020-12-25 MED FILL — Ursodiol Tab 500 MG: ORAL | 90 days supply | Qty: 270 | Fill #0 | Status: AC

## 2020-12-26 ENCOUNTER — Other Ambulatory Visit (HOSPITAL_COMMUNITY): Payer: Self-pay

## 2020-12-27 ENCOUNTER — Other Ambulatory Visit (HOSPITAL_COMMUNITY): Payer: Self-pay

## 2020-12-28 ENCOUNTER — Other Ambulatory Visit (HOSPITAL_COMMUNITY): Payer: Self-pay

## 2020-12-28 DIAGNOSIS — E785 Hyperlipidemia, unspecified: Secondary | ICD-10-CM | POA: Diagnosis not present

## 2020-12-28 DIAGNOSIS — K7581 Nonalcoholic steatohepatitis (NASH): Secondary | ICD-10-CM | POA: Diagnosis not present

## 2020-12-28 DIAGNOSIS — Z79899 Other long term (current) drug therapy: Secondary | ICD-10-CM | POA: Diagnosis not present

## 2020-12-28 DIAGNOSIS — E039 Hypothyroidism, unspecified: Secondary | ICD-10-CM | POA: Diagnosis not present

## 2020-12-28 DIAGNOSIS — E669 Obesity, unspecified: Secondary | ICD-10-CM | POA: Diagnosis not present

## 2021-01-07 DIAGNOSIS — Z122 Encounter for screening for malignant neoplasm of respiratory organs: Secondary | ICD-10-CM | POA: Diagnosis not present

## 2021-01-07 DIAGNOSIS — E039 Hypothyroidism, unspecified: Secondary | ICD-10-CM | POA: Diagnosis not present

## 2021-01-07 DIAGNOSIS — E785 Hyperlipidemia, unspecified: Secondary | ICD-10-CM | POA: Diagnosis not present

## 2021-01-07 DIAGNOSIS — K743 Primary biliary cirrhosis: Secondary | ICD-10-CM | POA: Diagnosis not present

## 2021-01-08 ENCOUNTER — Other Ambulatory Visit: Payer: Self-pay | Admitting: Internal Medicine

## 2021-01-08 DIAGNOSIS — Z87891 Personal history of nicotine dependence: Secondary | ICD-10-CM

## 2021-01-09 DIAGNOSIS — G4733 Obstructive sleep apnea (adult) (pediatric): Secondary | ICD-10-CM | POA: Diagnosis not present

## 2021-01-29 ENCOUNTER — Ambulatory Visit (INDEPENDENT_AMBULATORY_CARE_PROVIDER_SITE_OTHER): Payer: 59 | Admitting: Internal Medicine

## 2021-02-06 ENCOUNTER — Ambulatory Visit (HOSPITAL_COMMUNITY): Admission: RE | Admit: 2021-02-06 | Payer: 59 | Source: Ambulatory Visit

## 2021-02-06 ENCOUNTER — Encounter (HOSPITAL_COMMUNITY): Payer: Self-pay

## 2021-02-22 ENCOUNTER — Other Ambulatory Visit: Payer: Self-pay

## 2021-02-22 ENCOUNTER — Ambulatory Visit (HOSPITAL_COMMUNITY)
Admission: RE | Admit: 2021-02-22 | Discharge: 2021-02-22 | Disposition: A | Discharge status: Home / Self Care | Payer: 59 | Source: Ambulatory Visit | Attending: Internal Medicine | Admitting: Internal Medicine

## 2021-02-22 DIAGNOSIS — Z87891 Personal history of nicotine dependence: Secondary | ICD-10-CM | POA: Diagnosis not present

## 2021-02-27 DIAGNOSIS — G4733 Obstructive sleep apnea (adult) (pediatric): Secondary | ICD-10-CM | POA: Diagnosis not present

## 2021-03-17 ENCOUNTER — Emergency Department (HOSPITAL_COMMUNITY)
Admission: EM | Admit: 2021-03-17 | Discharge: 2021-03-17 | Disposition: A | Discharge status: Home / Self Care | Payer: 59 | Attending: Emergency Medicine | Admitting: Emergency Medicine

## 2021-03-17 DIAGNOSIS — Z87891 Personal history of nicotine dependence: Secondary | ICD-10-CM | POA: Diagnosis not present

## 2021-03-17 DIAGNOSIS — E039 Hypothyroidism, unspecified: Secondary | ICD-10-CM | POA: Diagnosis not present

## 2021-03-17 DIAGNOSIS — H60502 Unspecified acute noninfective otitis externa, left ear: Secondary | ICD-10-CM | POA: Diagnosis not present

## 2021-03-17 DIAGNOSIS — Z79899 Other long term (current) drug therapy: Secondary | ICD-10-CM | POA: Insufficient documentation

## 2021-03-17 DIAGNOSIS — H9202 Otalgia, left ear: Secondary | ICD-10-CM | POA: Diagnosis present

## 2021-03-17 MED ORDER — NEOMYCIN-POLYMYXIN-HC 3.5-10000-1 OT SUSP: 4.0000 [drp] | Freq: Four times a day (QID) | OTIC | 1 refills | Status: DC

## 2021-03-17 NOTE — ED Notes (Signed)
Left prior to triage by nursing staff

## 2021-03-17 NOTE — ED Provider Notes (Signed)
[facility] EMERGENCY DEPARTMENT Provider Note   CSN: [id number] Arrival date & time: 03/17/21  1607     History No chief complaint on file.   Cynthia Donaldson is a 65 y.o. female.  HPI She complains of ear pain for 2 days after wearing hearing aids while traveling.  She denies hearing loss, rhinorrhea, sinus congestion, sore throat, weakness or dizziness.  There are no other known active modifying factors.    Past Medical History:  Diagnosis Date   Bile duct stenosis    Hyperlipidemia    Hypothyroidism    PONV (postoperative nausea and vomiting)    Sleep apnea     Patient Active Problem List   Diagnosis Date Noted   Abdominal pain, epigastric 94/85/4627   Nonalcoholic steatohepatitis ([facility]) 01/25/2019   RUQ abdominal pain 01/25/2019   Special screening for malignant neoplasms, colon 09/28/2017   Primary biliary cholangitis ([place]) 09/28/2017   Fracture of distal phalanx of right thumb 11/02/2012   Thumb pain 11/02/2012   Pain in joint, shoulder region 04/08/2011   Muscle weakness (generalized) 04/08/2011   Rotator cuff tear, right 04/03/2011   Rotator cuff syndrome of right shoulder 01/28/2011   TRIGGER FINGER 06/17/2007   COLLES' FRACTURE, RIGHT WRIST 05/18/2007    Past Surgical History:  Procedure Laterality Date   anal fissure repair     CHOLECYSTECTOMY     COLONOSCOPY N/A 12/02/2017   Procedure: COLONOSCOPY;  Surgeon: Rogene Houston, MD;  Location: [facility]ITE;  Service: Endoscopy;  Laterality: N/A;  1030   ERCP      X3   MYOMECTOMY     TONSILLECTOMY       OB History     Gravida  1   Para      Term      Preterm      AB  1   Living  0      SAB  1   IAB      Ectopic      Multiple      Live Births              Family History  Problem Relation Age of Onset   Heart disease Other    Arthritis Other    Cancer Other    Hypertension Father    Cancer Father        stomach   Heart disease Mother    Colon cancer Brother     Suicidality Sister    Suicidality Brother    Hypertension Brother    Arthritis Brother    Hypertension Brother    Arthritis Brother    Hypertension Brother    Arthritis Brother     Social History   Tobacco Use   Smoking status: Former    Types: Cigarettes   Smokeless tobacco: Never   Tobacco comments:    07/2019  Vaping Use   Vaping Use: Never used  Substance Use Topics   Alcohol use: Yes    Comment: monthly   Drug use: No    Home Medications Prior to Admission medications   Medication Sig Start Date End Date Taking? Authorizing Provider  neomycin-polymyxin-hydrocortisone (CORTISPORIN) 3.5-10000-1 OTIC suspension Place 4 drops into the left ear 4 (four) times daily. X 7 days 03/17/21  Yes Daleen Bo, MD  buPROPion (WELLBUTRIN XL) 150 MG 24 hr tablet Take 450 mg by mouth daily.      [provider]  buPROPion (WELLBUTRIN XL) 150 MG 24 hr tablet TAKE  3 TABLETS BY MOUTH ONCE A DAY 05/04/20 05/04/21  Asencion Noble, MD  ezetimibe (ZETIA) 10 MG tablet Take 10 mg by mouth daily.    [provider]  ezetimibe (ZETIA) 10 MG tablet TAKE 1 TABLET BY MOUTH DAILY 11/21/19 11/20/20  Asencion Noble, MD  Hyoscyamine Sulfate SL (LEVSIN/SL) 0.125 MG SUBL Place 1 tablet under the tongue 4 (four) times daily as needed. 01/31/20   Rogene Houston, MD  levothyroxine (SYNTHROID) 150 MCG tablet TAKE 1 TABLET BY MOUTH ONCE DAILY 12/28/19 01/28/21  Asencion Noble, MD  levothyroxine (SYNTHROID, LEVOTHROID) 150 MCG tablet Take 150 mcg by mouth daily before breakfast.     [provider]  phentermine 30 MG capsule Take 30 mg by mouth daily. 11/10/17   [provider]  ursodiol (ACTIGALL) 500 MG tablet Take 1,500 mg by mouth daily.     [provider]  ursodiol (ACTIGALL) 500 MG tablet TAKE 1 TABLET BY MOUTH 3 TIMES DAILY 12/28/19 03/27/21  Asencion Noble, MD    Allergies    Codeine  Review of Systems   Review of Systems  All other systems reviewed and are  negative.  Physical Exam Updated Vital Signs There were no vitals taken for this visit.  Physical Exam Vitals and nursing note reviewed.  Constitutional:      Appearance: She is well-developed. She is not ill-appearing.  HENT:     Head: Normocephalic.     Right Ear: External ear normal.     Ears:     Comments: Left tragus is tender.  Left external auditory canal slightly swollen and reddened.  Left TM appears normal.    Nose: No congestion or rhinorrhea.     Mouth/Throat:     Mouth: Mucous membranes are moist.  Eyes:     Conjunctiva/sclera: Conjunctivae normal.     Pupils: Pupils are equal, round, and reactive to light.  Neck:     Trachea: Phonation normal.  Cardiovascular:     Rate and Rhythm: Normal rate.  Pulmonary:     Effort: Pulmonary effort is normal.  Abdominal:     General: There is no distension.     Palpations: Abdomen is soft.  Musculoskeletal:        General: Normal range of motion.     Cervical back: Normal range of motion and neck supple.  Skin:    General: Skin is warm and dry.     Comments: Normal gait  Neurological:     Mental Status: She is alert and oriented to person, place, and time.     Cranial Nerves: No cranial nerve deficit.     Sensory: No sensory deficit.     Motor: No abnormal muscle tone.     Coordination: Coordination normal.  Psychiatric:        Mood and Affect: Mood normal.        Behavior: Behavior normal.        Thought Content: Thought content normal.        Judgment: Judgment normal.    ED Results / Procedures / Treatments   Labs (all labs ordered are listed, but only abnormal results are displayed) Labs Reviewed - No data to display  EKG None  Radiology No results found.  Procedures Procedures   Medications Ordered in ED Medications - No data to display  ED Course  I have reviewed the triage vital signs and the nursing notes.  Pertinent labs & imaging results that were available during my care of the  patient  were reviewed by me and considered in my medical decision making (see chart for details).    MDM Rules/Calculators/A&P                            No data found.  6:49 PM Reevaluation with update and discussion. After initial assessment and treatment, an updated evaluation reveals Stable for discharge.  Findings discussed and questions answered. Daleen Bo   Medical Decision Making:  This patient is presenting for evaluation of left ear pain, which does not require a range of treatment options, and is not a complaint that involves a high risk of morbidity and mortality.   Critical Interventions-clinical evaluation  After These Interventions, the Patient was reevaluated and was found with acute left otitis externa  CRITICAL CARE-no Performed by: Daleen Bo  Nursing Notes Reviewed/ Care Coordinated Applicable Imaging Reviewed Interpretation of Laboratory Data incorporated into ED treatment  The patient appears reasonably screened and/or stabilized for discharge and I doubt any other medical condition or other [facility] requiring further screening, evaluation, or treatment in the ED at this time prior to discharge.  Plan: Home Medications-continue usual; Home Treatments-gradual advance activity; return here if the recommended treatment, does not improve the symptoms; Recommended follow up-PCP, as needed     Final Clinical Impression(s) / ED Diagnoses Final diagnoses:  Acute otitis externa of left ear, unspecified type    Rx / DC Orders ED Discharge Orders          Ordered    neomycin-polymyxin-hydrocortisone (CORTISPORIN) 3.5-10000-1 OTIC suspension  4 times daily        03/17/21 1639             Daleen Bo, MD 03/18/21 1849

## 2021-05-02 DIAGNOSIS — Z79899 Other long term (current) drug therapy: Secondary | ICD-10-CM | POA: Diagnosis not present

## 2021-05-02 DIAGNOSIS — K760 Fatty (change of) liver, not elsewhere classified: Secondary | ICD-10-CM | POA: Diagnosis not present

## 2021-05-02 DIAGNOSIS — E039 Hypothyroidism, unspecified: Secondary | ICD-10-CM | POA: Diagnosis not present

## 2021-05-02 DIAGNOSIS — K743 Primary biliary cirrhosis: Secondary | ICD-10-CM | POA: Diagnosis not present

## 2021-05-02 DIAGNOSIS — E785 Hyperlipidemia, unspecified: Secondary | ICD-10-CM | POA: Diagnosis not present

## 2021-05-09 ENCOUNTER — Other Ambulatory Visit (HOSPITAL_COMMUNITY): Payer: Self-pay

## 2021-05-09 DIAGNOSIS — Z23 Encounter for immunization: Secondary | ICD-10-CM | POA: Diagnosis not present

## 2021-05-09 DIAGNOSIS — K8301 Primary sclerosing cholangitis: Secondary | ICD-10-CM | POA: Diagnosis not present

## 2021-05-09 DIAGNOSIS — R918 Other nonspecific abnormal finding of lung field: Secondary | ICD-10-CM | POA: Diagnosis not present

## 2021-05-09 DIAGNOSIS — E039 Hypothyroidism, unspecified: Secondary | ICD-10-CM | POA: Diagnosis not present

## 2021-05-09 MED ORDER — LEVOTHYROXINE SODIUM 150 MCG PO TABS
150.0000 ug | ORAL_TABLET | Freq: Every day | ORAL | 4 refills | Status: DC
  Filled 2021-05-09: qty 90, 90d supply, fill #0
  Filled 2021-10-10: qty 90, 90d supply, fill #1
  Filled 2022-02-16: qty 90, 90d supply, fill #2

## 2021-05-09 MED ORDER — URSODIOL 500 MG PO TABS
500.0000 mg | ORAL_TABLET | Freq: Three times a day (TID) | ORAL | 4 refills | Status: DC
  Filled 2021-05-09: qty 270, 90d supply, fill #0
  Filled 2021-10-10: qty 90, 30d supply, fill #1
  Filled 2021-12-28: qty 70, 24d supply, fill #2
  Filled 2021-12-31: qty 20, 6d supply, fill #2
  Filled 2022-02-16: qty 90, 30d supply, fill #3
  Filled 2022-03-08: qty 270, 90d supply, fill #4

## 2021-05-09 MED ORDER — BUPROPION HCL ER (XL) 150 MG PO TB24
450.0000 mg | ORAL_TABLET | Freq: Every day | ORAL | 4 refills | Status: DC
  Filled 2021-05-09: qty 270, 90d supply, fill #0
  Filled 2021-10-10: qty 270, 90d supply, fill #1
  Filled 2022-02-16: qty 270, 90d supply, fill #2

## 2021-05-10 ENCOUNTER — Other Ambulatory Visit (HOSPITAL_COMMUNITY): Payer: Self-pay

## 2021-05-31 ENCOUNTER — Other Ambulatory Visit (HOSPITAL_COMMUNITY): Payer: Self-pay | Admitting: Internal Medicine

## 2021-05-31 ENCOUNTER — Other Ambulatory Visit: Payer: Self-pay | Admitting: Internal Medicine

## 2021-05-31 DIAGNOSIS — Z1231 Encounter for screening mammogram for malignant neoplasm of breast: Secondary | ICD-10-CM

## 2021-05-31 DIAGNOSIS — Z87891 Personal history of nicotine dependence: Secondary | ICD-10-CM

## 2021-05-31 DIAGNOSIS — R911 Solitary pulmonary nodule: Secondary | ICD-10-CM

## 2021-06-04 ENCOUNTER — Other Ambulatory Visit: Payer: Self-pay

## 2021-06-04 ENCOUNTER — Ambulatory Visit (HOSPITAL_COMMUNITY)
Admission: RE | Admit: 2021-06-04 | Discharge: 2021-06-04 | Disposition: A | Discharge status: Home / Self Care | Payer: 59 | Source: Ambulatory Visit | Attending: Internal Medicine | Admitting: Internal Medicine

## 2021-06-04 ENCOUNTER — Encounter (HOSPITAL_COMMUNITY): Payer: Self-pay

## 2021-06-04 DIAGNOSIS — R911 Solitary pulmonary nodule: Secondary | ICD-10-CM | POA: Diagnosis not present

## 2021-06-04 DIAGNOSIS — R918 Other nonspecific abnormal finding of lung field: Secondary | ICD-10-CM | POA: Diagnosis not present

## 2021-06-04 DIAGNOSIS — Z87891 Personal history of nicotine dependence: Secondary | ICD-10-CM | POA: Diagnosis not present

## 2021-06-04 DIAGNOSIS — J439 Emphysema, unspecified: Secondary | ICD-10-CM | POA: Diagnosis not present

## 2021-06-04 DIAGNOSIS — I700 Atherosclerosis of aorta: Secondary | ICD-10-CM | POA: Diagnosis not present

## 2021-06-07 ENCOUNTER — Other Ambulatory Visit (HOSPITAL_COMMUNITY): Payer: Self-pay

## 2021-06-07 ENCOUNTER — Encounter (INDEPENDENT_AMBULATORY_CARE_PROVIDER_SITE_OTHER): Payer: Self-pay | Admitting: Internal Medicine

## 2021-06-07 ENCOUNTER — Other Ambulatory Visit: Payer: Self-pay

## 2021-06-07 ENCOUNTER — Ambulatory Visit (INDEPENDENT_AMBULATORY_CARE_PROVIDER_SITE_OTHER): Payer: 59 | Admitting: Internal Medicine

## 2021-06-07 VITALS — BP 114/77 | HR 84 | Temp 98.3°F | Ht 69.0 in | Wt 240.4 lb

## 2021-06-07 DIAGNOSIS — K743 Primary biliary cirrhosis: Secondary | ICD-10-CM | POA: Diagnosis not present

## 2021-06-07 DIAGNOSIS — K7581 Nonalcoholic steatohepatitis (NASH): Secondary | ICD-10-CM

## 2021-06-07 DIAGNOSIS — R197 Diarrhea, unspecified: Secondary | ICD-10-CM | POA: Diagnosis not present

## 2021-06-07 MED ORDER — DICYCLOMINE HCL 10 MG PO CAPS
10.0000 mg | ORAL_CAPSULE | Freq: Two times a day (BID) | ORAL | 5 refills | Status: DC
  Filled 2021-06-07 (×2): qty 60, 30d supply, fill #0

## 2021-06-07 NOTE — Patient Instructions (Signed)
Take dicyclomine 10 mg by mouth 30 minutes before breakfast and lunch daily. Keep stool diary as to frequency and consistency of stools for 1 month and call with a progress report or send [place] a summary. Remember to exercise or walk at least 3 times a day if not more for at least 30 minutes each time.

## 2021-06-07 NOTE — Progress Notes (Signed)
Presenting complaint;  Diarrhea. Follow for liver disease(PBC and steatosis).  Database and subjective:  Patient is 65 year old Caucasian female who is here for scheduled visit.  She was last seen in September 2021. She is here for evaluation of her chronic diarrhea and liver disease.  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  She had stage II disease.  She has been on Urso ever since. Elastography in August 2019 suggested F3 and F4 disease.  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 She had a chest CT in October 2022 and again this week for follow-up of left upper lobe nodularity felt to be benign.  Further study did not provide any specific details about liver but study from this week mentions diffuse hepatic steatosis and nodular contour to the liver. She does complain of intermittent itching.  She takes Urso on the same time usually in the morning.  She used to walk regularly but has not done so lately. She drinks alcohol usually once a month or so. She has lost 17 pounds since her last visit September 2021.  *Patient has diarrhea is concerned she has anywhere from 3-8 stools per day.  Stool consistency is normal to begin with and becomes soft to loose on subsequent stools.  She has intermittent cramping across lower abdomen and urgency but she has not had any accidents.  She has occasional nocturnal diarrhea.  No melena or rectal bleeding.  She has taken hyoscyamine sublingual for cramping in the past but not recently.  Patient has been using Imodium OTC on as-needed basis.  She took anywhere from 4 to 6 mg/day while she was monitored clinically last year and she also takes Imodium when she is traveling.   It helps.  She does not have any side effects or constipation from it. Patient's last high risk colonoscopy was in July 2019 and she did not have any evidence of endoscopic colitis.  2 polyps that were removed were not adenomatous. She has a family history significant for colon carcinoma and 1 brother who was 18 or 59 years old and found to have advanced disease.   Current Medications: Outpatient Encounter Medications as of 06/07/2021  Medication Sig   buPROPion (WELLBUTRIN XL) 150 MG 24 hr tablet TAKE 3 TABLETS BY MOUTH ONCE A DAY   Hyoscyamine Sulfate SL (LEVSIN/SL) 0.125 MG SUBL Place 1 tablet under the tongue 4 (four) times daily as needed.   levothyroxine (SYNTHROID) 150 MCG tablet TAKE 1 TABLET BY MOUTH ONCE DAILY   ursodiol (ACTIGALL) 500 MG tablet TAKE 1 TABLET BY MOUTH 3 TIMES DAILY   [DISCONTINUED] buPROPion (WELLBUTRIN XL) 150 MG 24 hr tablet Take 450 mg by mouth daily.     neomycin-polymyxin-hydrocortisone (CORTISPORIN) 3.5-10000-1 OTIC suspension Place 4 drops into the left ear 4 (four) times daily. X 7 days (Patient not taking: Reported on 06/07/2021)   [DISCONTINUED] buPROPion (WELLBUTRIN XL) 150 MG 24 hr tablet TAKE 3 TABLETS BY MOUTH ONCE A DAY   [DISCONTINUED] ezetimibe (ZETIA) 10 MG tablet Take 10 mg by mouth daily.   [DISCONTINUED] ezetimibe (ZETIA) 10 MG tablet TAKE 1 TABLET BY MOUTH DAILY   [DISCONTINUED] levothyroxine (SYNTHROID) 150 MCG tablet TAKE 1 TABLET BY MOUTH ONCE DAILY   [DISCONTINUED] levothyroxine (SYNTHROID, LEVOTHROID) 150 MCG  tablet Take 150 mcg by mouth daily before breakfast.    [DISCONTINUED] phentermine 30 MG capsule Take 30 mg by mouth daily.   [DISCONTINUED] ursodiol (ACTIGALL) 500 MG tablet Take 1,500 mg by mouth daily.    Facility-Administered Encounter Medications as of 06/07/2021  Medication   methylPREDNISolone acetate (DEPO-MEDROL) injection 40 mg     Objective: Blood pressure 114/77, pulse 84, temperature 98.3 F (36.8 C), temperature  source Oral, height 5\' 9"  (1.753 m), weight 240 lb 6.4 oz (109 kg). Patient is alert and in no acute distress. Conjunctiva is pink. Sclera is nonicteric Oropharyngeal mucosa is normal. No neck masses or thyromegaly noted. Cardiac exam with regular rhythm normal S1 and S2. No murmur or gallop noted. Lungs are clear to auscultation. Abdomen is full but soft and nontender with organomegaly or masses. No LE edema or clubbing noted.  Labs/studies Results:   CBC Latest Ref Rng & Units 12/28/2017 06/30/2012 12/27/2010  WBC 4.0 - 10.5 K/uL 7.5 8.4 9.7  Hemoglobin 12.0 - 15.0 g/dL 14.8 14.3 14.8  Hematocrit 36.0 - 46.0 % 45.0 42.3 43.0  Platelets 150 - 400 K/uL 248 233 238    CMP Latest Ref Rng & Units 06/30/2012 12/27/2010 12/27/2010  Glucose 70 - 99 mg/dL 148(H) - 146(H)  BUN 6 - 23 mg/dL 20 - 22  Creatinine 0.50 - 1.10 mg/dL 0.95 - 0.82  Sodium 135 - 145 mEq/L 142 - 139  Potassium 3.5 - 5.1 mEq/L 3.8 - 4.0  Chloride 96 - 112 mEq/L 105 - 99  CO2 19 - 32 mEq/L 28 - 21  Calcium 8.4 - 10.5 mg/dL 9.1 - 10.0  Total Protein 6.0 - 8.3 g/dL 7.4 6.5 8.2  Total Bilirubin 0.3 - 1.2 mg/dL 0.3 1.0 2.3(H)  Alkaline Phos 39 - 117 U/L 132(H) 196(H) 276(H)  AST 0 - 37 U/L 24 218(H) 344(H)  ALT 0 - 35 U/L 45(H) 290(H) 247(H)    Lab data from 02/04/2019  Glucose 87, BUN 18, creatinine 0.92 Serum sodium 142, potassium 4.4, chloride 104, CO2 25 Serum calcium 9.3 Bilirubin 0.9, AP 841(66-063), AST 27, ALT 29, total protein 6.8 and albumin 4.6.  Lab data from 12/28/2020  WBC 6.1, H&H 13.2 and 39.1 and platelet count 220K Total cholesterol 220 Bilirubin 0.7, AP 168, AST 44, ALT 66 and albumin 4.5.  Lab data from 05/02/2021  Bilirubin 1.0 direct 0.37, AP 164, AST 47, ALT 68, total protein 6.5 and albumin 4.3.  Chest CT images reviewed.  I do not see any nodularity but there may be slight irregularity to hepatic margin.  Spleen is not enlarged.   Assessment:  #1.  Chronic diarrhea.  Chronic diarrhea appears  to be due therapy with bile salt i.e. or so and IBS.  She does not have any alarm symptoms.  She has getting relief from Imodium on an as-needed basis.  She needs to be on maintenance therapy rather than as needed.  If she does not respond to antispasmodic at a low dose would consider cholestyramine or Colestid.  She will also try taking Urso at night and see if it alters stool frequency.  If diarrhea persists would also screen for microscopic colitis when she has a high risk screening colonoscopy in July 2024.  #2.  Chronic liver disease.  She has primary biliary cholangitis and steatosis most recently stage by liver biopsy in August 2019.  Primary disease process stage II PBC and she also has steatosis.  Her transaminases and alkaline phosphatase have increased  since he has stopped exercising.  She needs to get back on her routine.  Her transaminases were normal and alkaline phosphatase was almost normal in September 2020.  I do not see any indication to add her back to add obeticholic acid at this time. Given chronic liver disease she should get bone density study.  We will check with Dr. Ria Comment office first before study ordered.  #3.  Patient is high risk for colorectal carcinoma because of family history.  She is up-to-date on screening protocol.  Next colonoscopy due in July 2024.   Plan:  Dicyclomine 10 mg by mouth 30 minutes before breakfast and lunch daily. Patient will keep stool diary as to consistency and frequency of stools for the next 1 month and call with a progress report or send [place] a summary. Regarding liver disease patient needs to resume regular physical activity as she used to do in the past. Bone density study unless she has had one by Dr. Willey Blade.  Will check with his office. She is to have LFTs in June 2023. Office visit with Dr. Jenetta Downer in 1 year.

## 2021-06-10 ENCOUNTER — Other Ambulatory Visit: Payer: Self-pay

## 2021-06-10 ENCOUNTER — Ambulatory Visit (HOSPITAL_COMMUNITY)
Admission: RE | Admit: 2021-06-10 | Discharge: 2021-06-10 | Disposition: A | Discharge status: Home / Self Care | Payer: 59 | Source: Ambulatory Visit | Attending: Internal Medicine | Admitting: Internal Medicine

## 2021-06-10 DIAGNOSIS — Z1231 Encounter for screening mammogram for malignant neoplasm of breast: Secondary | ICD-10-CM | POA: Insufficient documentation

## 2021-06-17 ENCOUNTER — Encounter (INDEPENDENT_AMBULATORY_CARE_PROVIDER_SITE_OTHER): Payer: Self-pay

## 2021-06-20 DIAGNOSIS — G4733 Obstructive sleep apnea (adult) (pediatric): Secondary | ICD-10-CM | POA: Diagnosis not present

## 2021-07-04 ENCOUNTER — Ambulatory Visit (INDEPENDENT_AMBULATORY_CARE_PROVIDER_SITE_OTHER): Payer: 59 | Admitting: Adult Health

## 2021-07-04 ENCOUNTER — Other Ambulatory Visit (HOSPITAL_COMMUNITY)
Admission: RE | Admit: 2021-07-04 | Discharge: 2021-07-04 | Disposition: A | Discharge status: Home / Self Care | Payer: 59 | Source: Ambulatory Visit | Attending: Adult Health | Admitting: Adult Health

## 2021-07-04 ENCOUNTER — Other Ambulatory Visit: Payer: Self-pay

## 2021-07-04 ENCOUNTER — Encounter: Payer: Self-pay | Admitting: Adult Health

## 2021-07-04 VITALS — BP 107/68 | HR 74 | Ht 69.0 in | Wt 242.5 lb

## 2021-07-04 DIAGNOSIS — R102 Pelvic and perineal pain: Secondary | ICD-10-CM | POA: Insufficient documentation

## 2021-07-04 DIAGNOSIS — Z01419 Encounter for gynecological examination (general) (routine) without abnormal findings: Secondary | ICD-10-CM

## 2021-07-04 DIAGNOSIS — Z780 Asymptomatic menopausal state: Secondary | ICD-10-CM

## 2021-07-04 MED ORDER — TRIAMCINOLONE ACETONIDE 0.5 % EX OINT: 1.0000 "application " | TOPICAL_OINTMENT | Freq: Two times a day (BID) | CUTANEOUS | 3 refills | Status: DC

## 2021-07-04 NOTE — Progress Notes (Signed)
Patient ID: Cynthia Donaldson, female   DOB: 19-Oct-1955, 66 y.o.   MRN: [id number] History of Present Illness: Cynthia Donaldson is a 66 year old white female, married, PM in for well woman gyn exam and pap. It has been about 20 years since last pap. She did 3 rounds of IVF, and adopted 2 children. Has history of fibroids with myomectomy years ago. PCP is Dr Willey Blade.   Current Medications, Allergies, Past Medical History, Past Surgical History, Family History and Social History were reviewed in [employer] record.     Review of Systems: Patient denies any headaches, hearing loss, fatigue, blurred vision, shortness of breath, chest pain,  problems with bowel movements, urination, or intercourse(not currently active). No joint pain or mood swings.  Has some pelvic cramping at times, is on Actigall and bentyl  Denies any vaginal bleeding +itching at clitoral area, she says Desitin helps  Physical Exam:BP 107/68 (BP Location: Left Arm, Patient Position: Sitting, Cuff Size: Normal)    Pulse 74    Ht 5\' 9"  (1.753 m)    Wt 242 lb 8 oz (110 kg)    BMI 35.81 kg/m   General:  Well developed, well nourished, no acute distress Skin:  Warm and dry Neck:  Midline trachea, normal thyroid, good ROM, no lymphadenopathy,no carotid bruits heard Lungs; Clear to auscultation bilaterally Breast:  Deferred had normal mammogram 06/10/21 Cardiovascular: Regular rate and rhythm Abdomen:  Soft, non tender, no hepatosplenomegaly noted Pelvic:  External genitalia is normal in appearance, 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  The vagina is pale. Urethra has no lesions or masses. The cervix is smooth, pap with HR HPV genotyping performed.  Uterus is felt to be normal size, shape, and contour.  No adnexal masses or tenderness noted.Bladder is non tender, no masses felt. Rectal: Deferred  Extremities/musculoskeletal:  No swelling or varicosities noted, no clubbing or cyanosis Psych:  No mood changes, alert and  cooperative,seems happy AA is 3  Fall risk is low Depression screen [facility] 2/9 07/04/2021 04/06/2017  Decreased Interest 0 0  Down, Depressed, Hopeless 0 0  PHQ - 2 Score 0 0  Altered sleeping 0 -  Tired, decreased energy 0 -  Change in appetite 0 -  Feeling bad or failure about yourself  0 -  Trouble concentrating 0 -  Moving slowly or fidgety/restless 0 -  Suicidal thoughts 0 -  PHQ-9 Score 0 -    Is on Wellbutrin from PCP GAD 7 : Generalized Anxiety Score 07/04/2021  Nervous, Anxious, on Edge 0  Control/stop worrying 0  Worry too much - different things 0  Trouble relaxing 0  Restless 0  Easily annoyed or irritable 1  Afraid - awful might happen 0  Total GAD 7 Score 1    Examination chaperoned by Celene Squibb LPN  Impression and Plan: 1. Encounter for gynecological examination with Papanicolaou smear of cervix Pap sent Physical with PCP Pap in 3 year Labs with PCP Mammogram yearly Colonoscopy per GI  Talk to Dr Willey Blade about getting DEXA now  Will Rx kenalog for itching in vulva area Meds ordered this encounter  Medications   triamcinolone ointment (KENALOG) 0.5 %    Sig: Apply 1 application topically 2 (two) times daily.    Dispense:  30 g    Refill:  3    Order Specific Question:   Supervising Provider    Answer:   Elonda Husky, LUTHER H [[id number]]    2. Postmenopause   3. Pelvic cramping  Will get pelvic US to assess uterus and ovaries.

## 2021-07-09 ENCOUNTER — Other Ambulatory Visit: Payer: Self-pay | Admitting: Adult Health

## 2021-07-09 LAB — CYTOLOGY - PAP
Adequacy: ABSENT
Comment: NEGATIVE
Diagnosis: NEGATIVE
High risk HPV: NEGATIVE

## 2021-07-09 MED ORDER — FLUCONAZOLE 150 MG PO TABS: ORAL_TABLET | ORAL | 1 refills | Status: DC

## 2021-08-02 ENCOUNTER — Other Ambulatory Visit: Payer: Self-pay

## 2021-08-02 ENCOUNTER — Ambulatory Visit (INDEPENDENT_AMBULATORY_CARE_PROVIDER_SITE_OTHER): Payer: 59

## 2021-08-02 DIAGNOSIS — R102 Pelvic and perineal pain: Secondary | ICD-10-CM | POA: Diagnosis not present

## 2021-08-02 NOTE — Progress Notes (Addendum)
PELVIC US TA/TV: enlarged heterogeneous anteverted uterus with multiple fibroids,(#1) right lower uterine segment subserosal fibroid 7.2 x 5 x 5.3 cm,(#2) anterior right subserosal fibroid 1.9 x 1.9 x 1.4 cm,homogeneous thickened endometrium 9 mm,normal ovaries,unable to slide ovaries,no pain during ultrasound,no free fluid  ? ?Chaperone Tammy ?

## 2021-08-13 ENCOUNTER — Telehealth: Payer: Self-pay | Admitting: Adult Health

## 2021-08-13 NOTE — Telephone Encounter (Signed)
Pt aware that US showed normal ovaries, +fibroids and EEC 9 mm but no bleeding.  ?

## 2021-09-02 DIAGNOSIS — K743 Primary biliary cirrhosis: Secondary | ICD-10-CM | POA: Diagnosis not present

## 2021-09-10 DIAGNOSIS — K743 Primary biliary cirrhosis: Secondary | ICD-10-CM | POA: Diagnosis not present

## 2021-09-10 DIAGNOSIS — L209 Atopic dermatitis, unspecified: Secondary | ICD-10-CM | POA: Diagnosis not present

## 2021-09-10 DIAGNOSIS — Z23 Encounter for immunization: Secondary | ICD-10-CM | POA: Diagnosis not present

## 2021-09-13 ENCOUNTER — Other Ambulatory Visit (INDEPENDENT_AMBULATORY_CARE_PROVIDER_SITE_OTHER): Payer: Self-pay | Admitting: Internal Medicine

## 2021-09-21 ENCOUNTER — Other Ambulatory Visit (HOSPITAL_COMMUNITY): Payer: Self-pay

## 2021-09-21 ENCOUNTER — Telehealth (INDEPENDENT_AMBULATORY_CARE_PROVIDER_SITE_OTHER): Payer: Self-pay | Admitting: Internal Medicine

## 2021-09-21 MED ORDER — OCALIVA 5 MG PO TABS
5.0000 mg | ORAL_TABLET | Freq: Every day | ORAL | 2 refills | Status: DC
Start: 2021-09-21 — End: 2022-02-17
  Filled 2021-09-21: qty 30, fill #0

## 2021-09-21 NOTE — Telephone Encounter (Signed)
I contacted Cynthia Donaldson to review results of LFTs from 09/02/2021 that were performed by Dr. Willey Blade. ?I called her last week but she was out of town. ?Alkaline phosphatase is 154 AST 47 and ALT 66. ?Her numbers in December 2022 were 164, 47 and 68 respectively. ?Her transaminases were normal on at least 2 occasions in 2020. 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  She also had moderate steatosis without steatohepatitis. ?Since her transaminases and alkaline phosphatase are not returning to normal it would be reasonable to treat her with obeticholic acid. ?She is agreeable.  I also touch bases with her husband Dr. Gala Romney and he is also in agreement. ?Patient informed of common side effects such as pruritus and fatigue. ?We will start on 5 mg by mouth daily.  Prescription sent to her pharmacy. ?She will have CBC with differential comprehensive chemistry panel in 2 months. ? ?

## 2021-09-23 ENCOUNTER — Other Ambulatory Visit (HOSPITAL_COMMUNITY): Payer: Self-pay

## 2021-09-23 NOTE — Telephone Encounter (Signed)
Patient added to reminder file for cbc with diff and cmp.  ?

## 2021-09-24 DIAGNOSIS — G4733 Obstructive sleep apnea (adult) (pediatric): Secondary | ICD-10-CM | POA: Diagnosis not present

## 2021-09-27 ENCOUNTER — Telehealth (INDEPENDENT_AMBULATORY_CARE_PROVIDER_SITE_OTHER): Payer: Self-pay | Admitting: *Deleted

## 2021-09-27 ENCOUNTER — Other Ambulatory Visit (INDEPENDENT_AMBULATORY_CARE_PROVIDER_SITE_OTHER): Payer: Self-pay

## 2021-09-27 DIAGNOSIS — K743 Primary biliary cirrhosis: Secondary | ICD-10-CM

## 2021-09-27 NOTE — Telephone Encounter (Signed)
Cynthia Donaldson -Please schedule [place] elastrography -  ? Patient was interested in taking ocaliva for primary biliary cholangitis. Per Dr.Rehman will need [place] elastrography first. Patient can go any day EXCEPT May 10th, 11th, 12th, or May 16th. ? ?

## 2021-10-07 NOTE — Telephone Encounter (Signed)
Cynthia Donaldson, is patient aware of ultrasound or do I need to let her know.  ?

## 2021-10-09 ENCOUNTER — Ambulatory Visit (HOSPITAL_COMMUNITY)
Admission: RE | Admit: 2021-10-09 | Discharge: 2021-10-09 | Disposition: A | Discharge status: Home / Self Care | Payer: 59 | Source: Ambulatory Visit | Attending: Internal Medicine | Admitting: Internal Medicine

## 2021-10-09 DIAGNOSIS — K743 Primary biliary cirrhosis: Secondary | ICD-10-CM

## 2021-10-09 DIAGNOSIS — K7689 Other specified diseases of liver: Secondary | ICD-10-CM | POA: Diagnosis not present

## 2021-10-10 ENCOUNTER — Other Ambulatory Visit (HOSPITAL_COMMUNITY): Payer: Self-pay

## 2021-10-11 ENCOUNTER — Other Ambulatory Visit (HOSPITAL_COMMUNITY): Payer: Self-pay

## 2021-10-17 ENCOUNTER — Other Ambulatory Visit (HOSPITAL_COMMUNITY): Payer: Self-pay

## 2021-10-22 ENCOUNTER — Other Ambulatory Visit (HOSPITAL_COMMUNITY): Payer: Self-pay

## 2021-10-25 ENCOUNTER — Other Ambulatory Visit (HOSPITAL_COMMUNITY): Payer: Self-pay

## 2021-11-07 ENCOUNTER — Other Ambulatory Visit (INDEPENDENT_AMBULATORY_CARE_PROVIDER_SITE_OTHER): Payer: Self-pay | Admitting: *Deleted

## 2021-11-22 ENCOUNTER — Ambulatory Visit
Admission: EM | Admit: 2021-11-22 | Discharge: 2021-11-22 | Disposition: A | Discharge status: Home / Self Care | Payer: 59 | Attending: Nurse Practitioner | Admitting: Nurse Practitioner

## 2021-11-22 ENCOUNTER — Ambulatory Visit (INDEPENDENT_AMBULATORY_CARE_PROVIDER_SITE_OTHER): Payer: 59

## 2021-11-22 DIAGNOSIS — M25562 Pain in left knee: Secondary | ICD-10-CM

## 2021-11-22 DIAGNOSIS — Z23 Encounter for immunization: Secondary | ICD-10-CM | POA: Diagnosis not present

## 2021-11-22 DIAGNOSIS — S81012A Laceration without foreign body, left knee, initial encounter: Secondary | ICD-10-CM

## 2021-11-22 DIAGNOSIS — W19XXXA Unspecified fall, initial encounter: Secondary | ICD-10-CM

## 2021-11-22 DIAGNOSIS — S0181XA Laceration without foreign body of other part of head, initial encounter: Secondary | ICD-10-CM | POA: Diagnosis not present

## 2021-11-22 MED ORDER — TETANUS-DIPHTH-ACELL PERTUSSIS 5-2.5-18.5 LF-MCG/0.5 IM SUSY
0.5000 mL | PREFILLED_SYRINGE | Freq: Once | INTRAMUSCULAR | Status: AC
  Administered 2021-11-22: 0.5 mL via INTRAMUSCULAR

## 2021-11-22 NOTE — ED Triage Notes (Signed)
Pt had fall today and has laceration to left eye and also has left knee pain . Bleeding controlled

## 2021-11-22 NOTE — ED Provider Notes (Signed)
[facility]    CSN: [id number] Arrival date & time: 11/22/21  1442      History   Chief Complaint Chief Complaint  Patient presents with   Laceration   Fall    HPI Cynthia Donaldson is a 66 y.o. female.   Patient presents for knee pain and a laceration to her face after a fall today.  Reports she is currently helping move her daughter from [place] to [place] and she tripped over her flip flops and fell directly on her knee; hit the left side of her face on some brick.  She denies loss of consciousness, headache, or vision changes since the event.  She has been able to ambulate on her left leg, however it is painful.  Denies overt swelling or bruising to the left knee.  The bleeding to the laceration is well controlled.  She denies use of blood thinners.  She has been applying ice to the knee without much relief in pain.    Past Medical History:  Diagnosis Date   Bile duct stenosis    Hyperlipidemia    Hypothyroidism    PONV (postoperative nausea and vomiting)    Sleep apnea     Patient Active Problem List   Diagnosis Date Noted   Encounter for gynecological examination with Papanicolaou smear of cervix 07/04/2021   Pelvic cramping 07/04/2021   Postmenopause 07/04/2021   Diarrhea 06/07/2021   Abdominal pain, epigastric 08/65/7846   Nonalcoholic steatohepatitis ([facility]) 01/25/2019   RUQ abdominal pain 01/25/2019   Special screening for malignant neoplasms, colon 09/28/2017   Primary biliary cholangitis ([place]) 09/28/2017   Fracture of distal phalanx of right thumb 11/02/2012   Thumb pain 11/02/2012   Pain in joint, shoulder region 04/08/2011   Muscle weakness (generalized) 04/08/2011   Rotator cuff tear, right 04/03/2011   Rotator cuff syndrome of right shoulder 01/28/2011   TRIGGER FINGER 06/17/2007   COLLES' FRACTURE, RIGHT WRIST 05/18/2007    Past Surgical History:  Procedure Laterality Date   anal fissure repair     CHOLECYSTECTOMY     COLONOSCOPY  N/A 12/02/2017   Procedure: COLONOSCOPY;  Surgeon: Rogene Houston, MD;  Location: [facility];  Service: Endoscopy;  Laterality: N/A;  1030   ERCP      X3   MYOMECTOMY     TONSILLECTOMY      OB History     Gravida  1   Para      Term      Preterm      AB  1   Living  0      SAB  1   IAB      Ectopic      Multiple      Live Births               Home Medications    Prior to Admission medications   Medication Sig Start Date End Date Taking? Authorizing Provider  fluconazole (DIFLUCAN) 150 MG tablet Take 1 now and repeat 1 in 3 days 07/09/21   Estill Dooms, NP  buPROPion (WELLBUTRIN XL) 150 MG 24 hr tablet TAKE 3 TABLETS BY MOUTH ONCE A DAY 05/09/21     dicyclomine (BENTYL) 10 MG capsule Take 1 capsule (10 mg total) by mouth 2 (two) times daily before a meal. 06/07/21   Rehman, Mechele Dawley, MD  levothyroxine (SYNTHROID) 150 MCG tablet TAKE 1 TABLET BY MOUTH ONCE DAILY 05/09/21     neomycin-polymyxin-hydrocortisone (CORTISPORIN) 3.5-10000-1  OTIC suspension Place 4 drops into the left ear 4 (four) times daily. X 7 days 03/17/21   Daleen Bo, MD  Obeticholic Acid (OCALIVA) 5 MG TABS Take 1 tablet by mouth daily. 09/21/21   Rehman, Mechele Dawley, MD  triamcinolone ointment (KENALOG) 0.5 % Apply 1 application topically 2 (two) times daily. 07/04/21   Estill Dooms, NP  ursodiol (ACTIGALL) 500 MG tablet TAKE 1 TABLET BY MOUTH 3 TIMES DAILY 05/09/21       Family History Family History  Problem Relation Age of Onset   Heart disease Other    Arthritis Other    Cancer Other    Hypertension Father    Cancer Father        stomach   Heart disease Mother    Colon cancer Brother    Suicidality Sister    Suicidality Brother    Hypertension Brother    Arthritis Brother    Hypertension Brother    Arthritis Brother    Hypertension Brother    Arthritis Brother     Social History Social History   Tobacco Use   Smoking status: Former    Types: Cigarettes     Quit date: 2022    Years since quitting: 1.4   Smokeless tobacco: Never   Tobacco comments:    07/2019  Vaping Use   Vaping Use: Never used  Substance Use Topics   Alcohol use: Yes    Comment: monthly   Drug use: No     Allergies   Codeine   Review of Systems Review of Systems Per HPI  Physical Exam Triage Vital Signs ED Triage Vitals  Enc Vitals Group     BP 11/22/21 1502 101/69     Pulse Rate 11/22/21 1502 98     Resp 11/22/21 1502 20     Temp 11/22/21 1502 98.1 F (36.7 C)     Temp src --      SpO2 11/22/21 1502 96 %     Weight --      Height --      Head Circumference --      Peak Flow --      Pain Score 11/22/21 1500 6     Pain Loc --      Pain Edu? --      Excl. in [place]? --    No data found.  Updated Vital Signs BP 101/69   Pulse 98   Temp 98.1 F (36.7 C)   Resp 20   SpO2 96%   Visual Acuity Right Eye Distance:   Left Eye Distance:   Bilateral Distance:    Right Eye Near:   Left Eye Near:    Bilateral Near:     Physical Exam Vitals and nursing note reviewed.  Constitutional:      General: She is not in acute distress.    Appearance: Normal appearance. She is not toxic-appearing.  HENT:     Head:      Comments: Laceration to left face as marked above; ecchymosis noted posteriorly  Pulmonary:     Effort: Pulmonary effort is normal. No respiratory distress.  Musculoskeletal:        General: Swelling present.     Right knee: Normal.     Left knee: Swelling, effusion and bony tenderness present. No erythema. Normal range of motion. No tenderness. Normal patellar mobility. Normal pulse.     Comments: Tenderness to palpation around the patella; laxity difficult to perform secondary to pain.  No obvious deformity, erythema.  Skin:    General: Skin is warm and dry.     Capillary Refill: Capillary refill takes less than 2 seconds.     Findings: Laceration present.  Neurological:     Mental Status: She is alert and oriented to person,  place, and time.  Psychiatric:        Behavior: Behavior is cooperative.      UC Treatments / Results  Labs (all labs ordered are listed, but only abnormal results are displayed) Labs Reviewed - No data to display  EKG   Radiology DG Knee AP/LAT W/[place] Left  Result Date: 11/22/2021 CLINICAL DATA:  left knee pain after fall today EXAM: LEFT KNEE 3 VIEWS COMPARISON:  None Available. FINDINGS: No fracture or dislocation. Normal alignment. Benign-appearing sclerotic medullary lesion in the femoral intercondylar notch. Mild narrowing of the articular cartilage in the medial compartment. No effusion. IMPRESSION: 1. No fracture or other acute finding. 2. Mild medial compartment cartilage narrowing Electronically Signed   By: Lucrezia Europe M.D.   On: 11/22/2021 15:44    Procedures Laceration Repair  Date/Time: 11/22/2021 5:08 PM  Performed by: Eulogio Bear, NP Authorized by: Eulogio Bear, NP   Consent:    Consent obtained:  Verbal   Consent given by:  Patient   Risks, benefits, and alternatives were discussed: yes     Risks discussed:  Infection, poor wound healing and poor cosmetic result   Alternatives discussed:  No treatment Universal protocol:    Procedure explained and questions answered to patient or proxy's satisfaction: yes     Patient identity confirmed:  Verbally with patient Anesthesia:    Anesthesia method:  None Laceration details:    Location:  Face   Facial location: lateral to left eye.   Length (cm):  4 Treatment:    Area cleansed with:  Soap and water   Amount of cleaning:  Standard Skin repair:    Repair method:  Steri-Strips   Number of Steri-Strips:  6 Approximation:    Approximation:  Close Repair type:    Repair type:  Simple Post-procedure details:    Dressing:  Open (no dressing)   Procedure completion:  Tolerated well, no immediate complications  (including critical care time)  Medications Ordered in UC Medications  Tdap  (BOOSTRIX) injection 0.5 mL (0.5 mLs Intramuscular Given 11/22/21 1556)    Initial Impression / Assessment and Plan / UC Course  I have reviewed the triage vital signs and the nursing notes.  Pertinent labs & imaging results that were available during my care of the patient were reviewed by me and considered in my medical decision making (see chart for details).    Patient is a very pleasant 66 year old female presenting today after a fall.  The knee x-ray is negative for patella fracture; we discussed ice, use of ibuprofen alternating with Tylenol, and rest to help with the pain.  Laceration to left side of the face repaired as above.  Tdap updated.  Discussed wound care and signs of infection to seek urgent care for.  Seek care if symptoms persist or worsen despite treatment. Final Clinical Impressions(s) / UC Diagnoses   Final diagnoses:  Acute pain of left knee  Facial laceration, initial encounter     Discharge Instructions      - We put Steri-Strips over the laceration on your face; please do not remove these they will fall off on their own -We have updated your tetanus shot today -Knee  x-ray is negative for a patella fracture; you can continue ice 15 minutes on, 45 minutes off as well as Tylenol 500 mg every 6 hours as needed for pain     ED Prescriptions   None    PDMP not reviewed this encounter.   Eulogio Bear, NP 11/22/21 1710

## 2021-11-22 NOTE — Discharge Instructions (Addendum)
-   We put Steri-Strips over the laceration on your face; please do not remove these they will fall off on their own -We have updated your tetanus shot today -Knee x-ray is negative for a patella fracture; you can continue ice 15 minutes on, 45 minutes off as well as Tylenol 500 mg every 6 hours as needed for pain

## 2021-12-25 ENCOUNTER — Encounter (HOSPITAL_COMMUNITY): Payer: Self-pay

## 2021-12-25 ENCOUNTER — Emergency Department (HOSPITAL_COMMUNITY): Payer: 59

## 2021-12-25 ENCOUNTER — Inpatient Hospital Stay (HOSPITAL_COMMUNITY)
Admission: EM | Admit: 2021-12-25 | Discharge: 2021-12-29 | DRG: 445 | Disposition: A | Discharge status: Home / Self Care | Payer: 59 | Attending: Internal Medicine | Admitting: Internal Medicine

## 2021-12-25 ENCOUNTER — Encounter (HOSPITAL_COMMUNITY): Payer: Self-pay | Admitting: Emergency Medicine

## 2021-12-25 ENCOUNTER — Other Ambulatory Visit: Payer: Self-pay

## 2021-12-25 DIAGNOSIS — E669 Obesity, unspecified: Secondary | ICD-10-CM | POA: Diagnosis not present

## 2021-12-25 DIAGNOSIS — F32A Depression, unspecified: Secondary | ICD-10-CM | POA: Diagnosis present

## 2021-12-25 DIAGNOSIS — Z885 Allergy status to narcotic agent status: Secondary | ICD-10-CM

## 2021-12-25 DIAGNOSIS — B952 Enterococcus as the cause of diseases classified elsewhere: Secondary | ICD-10-CM | POA: Diagnosis not present

## 2021-12-25 DIAGNOSIS — Z87891 Personal history of nicotine dependence: Secondary | ICD-10-CM | POA: Diagnosis not present

## 2021-12-25 DIAGNOSIS — Z79899 Other long term (current) drug therapy: Secondary | ICD-10-CM

## 2021-12-25 DIAGNOSIS — E039 Hypothyroidism, unspecified: Secondary | ICD-10-CM | POA: Diagnosis present

## 2021-12-25 DIAGNOSIS — K7581 Nonalcoholic steatohepatitis (NASH): Secondary | ICD-10-CM | POA: Diagnosis not present

## 2021-12-25 DIAGNOSIS — Z6834 Body mass index (BMI) 34.0-34.9, adult: Secondary | ICD-10-CM

## 2021-12-25 DIAGNOSIS — K319 Disease of stomach and duodenum, unspecified: Secondary | ICD-10-CM | POA: Diagnosis not present

## 2021-12-25 DIAGNOSIS — Z800 Family history of malignant neoplasm of digestive organs: Secondary | ICD-10-CM | POA: Diagnosis not present

## 2021-12-25 DIAGNOSIS — R1084 Generalized abdominal pain: Secondary | ICD-10-CM | POA: Diagnosis not present

## 2021-12-25 DIAGNOSIS — D539 Nutritional anemia, unspecified: Secondary | ICD-10-CM | POA: Diagnosis not present

## 2021-12-25 DIAGNOSIS — K8050 Calculus of bile duct without cholangitis or cholecystitis without obstruction: Secondary | ICD-10-CM | POA: Diagnosis present

## 2021-12-25 DIAGNOSIS — Z8261 Family history of arthritis: Secondary | ICD-10-CM

## 2021-12-25 DIAGNOSIS — B9689 Other specified bacterial agents as the cause of diseases classified elsewhere: Secondary | ICD-10-CM | POA: Diagnosis not present

## 2021-12-25 DIAGNOSIS — Z79890 Hormone replacement therapy (postmenopausal): Secondary | ICD-10-CM | POA: Diagnosis not present

## 2021-12-25 DIAGNOSIS — Z8249 Family history of ischemic heart disease and other diseases of the circulatory system: Secondary | ICD-10-CM

## 2021-12-25 DIAGNOSIS — R945 Abnormal results of liver function studies: Secondary | ICD-10-CM | POA: Diagnosis present

## 2021-12-25 DIAGNOSIS — K838 Other specified diseases of biliary tract: Secondary | ICD-10-CM | POA: Diagnosis not present

## 2021-12-25 DIAGNOSIS — K8021 Calculus of gallbladder without cholecystitis with obstruction: Secondary | ICD-10-CM | POA: Diagnosis not present

## 2021-12-25 DIAGNOSIS — E785 Hyperlipidemia, unspecified: Secondary | ICD-10-CM | POA: Diagnosis present

## 2021-12-25 DIAGNOSIS — K743 Primary biliary cirrhosis: Secondary | ICD-10-CM | POA: Diagnosis not present

## 2021-12-25 DIAGNOSIS — R109 Unspecified abdominal pain: Secondary | ICD-10-CM | POA: Diagnosis not present

## 2021-12-25 DIAGNOSIS — G4730 Sleep apnea, unspecified: Secondary | ICD-10-CM | POA: Diagnosis present

## 2021-12-25 DIAGNOSIS — K449 Diaphragmatic hernia without obstruction or gangrene: Secondary | ICD-10-CM | POA: Diagnosis not present

## 2021-12-25 DIAGNOSIS — R1013 Epigastric pain: Secondary | ICD-10-CM | POA: Diagnosis not present

## 2021-12-25 DIAGNOSIS — K8033 Calculus of bile duct with acute cholangitis with obstruction: Principal | ICD-10-CM | POA: Diagnosis present

## 2021-12-25 DIAGNOSIS — R935 Abnormal findings on diagnostic imaging of other abdominal regions, including retroperitoneum: Secondary | ICD-10-CM | POA: Diagnosis not present

## 2021-12-25 DIAGNOSIS — R932 Abnormal findings on diagnostic imaging of liver and biliary tract: Secondary | ICD-10-CM | POA: Diagnosis not present

## 2021-12-25 DIAGNOSIS — K760 Fatty (change of) liver, not elsewhere classified: Secondary | ICD-10-CM | POA: Diagnosis not present

## 2021-12-25 DIAGNOSIS — K3189 Other diseases of stomach and duodenum: Secondary | ICD-10-CM | POA: Diagnosis not present

## 2021-12-25 DIAGNOSIS — K2960 Other gastritis without bleeding: Secondary | ICD-10-CM | POA: Diagnosis not present

## 2021-12-25 DIAGNOSIS — R7989 Other specified abnormal findings of blood chemistry: Secondary | ICD-10-CM | POA: Diagnosis not present

## 2021-12-25 DIAGNOSIS — R7881 Bacteremia: Secondary | ICD-10-CM | POA: Diagnosis not present

## 2021-12-25 DIAGNOSIS — K2289 Other specified disease of esophagus: Secondary | ICD-10-CM | POA: Diagnosis not present

## 2021-12-25 DIAGNOSIS — R9431 Abnormal electrocardiogram [ECG] [EKG]: Secondary | ICD-10-CM | POA: Diagnosis not present

## 2021-12-25 DIAGNOSIS — R17 Unspecified jaundice: Secondary | ICD-10-CM | POA: Diagnosis not present

## 2021-12-25 DIAGNOSIS — R161 Splenomegaly, not elsewhere classified: Secondary | ICD-10-CM | POA: Diagnosis not present

## 2021-12-25 LAB — CBC WITH DIFFERENTIAL/PLATELET
Abs Immature Granulocytes: 0.06 10*3/uL (ref 0.00–0.07)
Basophils Absolute: 0.1 10*3/uL (ref 0.0–0.1)
Basophils Relative: 1 %
Eosinophils Absolute: 0.1 10*3/uL (ref 0.0–0.5)
Eosinophils Relative: 1 %
HCT: 35.6 % — ABNORMAL LOW (ref 36.0–46.0)
Hemoglobin: 11.7 g/dL — ABNORMAL LOW (ref 12.0–15.0)
Immature Granulocytes: 1 %
Lymphocytes Relative: 6 %
Lymphs Abs: 0.6 10*3/uL — ABNORMAL LOW (ref 0.7–4.0)
MCH: 34.4 pg — ABNORMAL HIGH (ref 26.0–34.0)
MCHC: 32.9 g/dL (ref 30.0–36.0)
MCV: 104.7 fL — ABNORMAL HIGH (ref 80.0–100.0)
Monocytes Absolute: 0.1 10*3/uL (ref 0.1–1.0)
Monocytes Relative: 1 %
Neutro Abs: 8.8 10*3/uL — ABNORMAL HIGH (ref 1.7–7.7)
Neutrophils Relative %: 90 %
Platelets: 258 10*3/uL (ref 150–400)
RBC: 3.4 MIL/uL — ABNORMAL LOW (ref 3.87–5.11)
RDW: 15.6 % — ABNORMAL HIGH (ref 11.5–15.5)
WBC: 9.8 10*3/uL (ref 4.0–10.5)
nRBC: 0 % (ref 0.0–0.2)

## 2021-12-25 LAB — BASIC METABOLIC PANEL
Anion gap: 9 (ref 5–15)
BUN: 24 mg/dL — ABNORMAL HIGH (ref 8–23)
CO2: 25 mmol/L (ref 22–32)
Calcium: 8.6 mg/dL — ABNORMAL LOW (ref 8.9–10.3)
Chloride: 108 mmol/L (ref 98–111)
Creatinine, Ser: 0.91 mg/dL (ref 0.44–1.00)
GFR, Estimated: >60 mL/min (ref >60)
Glucose, Bld: 130 mg/dL — ABNORMAL HIGH (ref 70–99)
Potassium: 4.4 mmol/L (ref 3.5–5.1)
Sodium: 142 mmol/L (ref 135–145)

## 2021-12-25 LAB — HEPATIC FUNCTION PANEL
ALT: 97 U/L — ABNORMAL HIGH (ref 0–44)
AST: 98 U/L — ABNORMAL HIGH (ref 15–41)
Albumin: 3.8 g/dL (ref 3.5–5.0)
Alkaline Phosphatase: 141 U/L — ABNORMAL HIGH (ref 38–126)
Bilirubin, Direct: 3.5 mg/dL — ABNORMAL HIGH (ref 0.0–0.2)
Indirect Bilirubin: 2.5 mg/dL — ABNORMAL HIGH (ref 0.3–0.9)
Total Bilirubin: 6 mg/dL — ABNORMAL HIGH (ref 0.3–1.2)
Total Protein: 7.3 g/dL (ref 6.5–8.1)

## 2021-12-25 LAB — PROTIME-INR
INR: 1.1 (ref 0.8–1.2)
Prothrombin Time: 13.9 seconds (ref 11.4–15.2)

## 2021-12-25 LAB — LACTIC ACID, PLASMA
Lactic Acid, Venous: 2.7 mmol/L (ref 0.5–1.9)
Lactic Acid, Venous: 1.8 mmol/L (ref 0.5–1.9)
Lactic Acid, Venous: 1.6 mmol/L (ref 0.5–1.9)

## 2021-12-25 LAB — LIPASE, BLOOD: Lipase: 41 U/L (ref 11–51)

## 2021-12-25 MED ORDER — HYDROMORPHONE HCL 1 MG/ML IJ SOLN
1.0000 mg | Freq: Once | INTRAMUSCULAR | Status: AC
  Administered 2021-12-25: 1 mg via INTRAVENOUS
  Filled 2021-12-25: qty 1

## 2021-12-25 MED ORDER — ONDANSETRON HCL 4 MG/2ML IJ SOLN
4.0000 mg | Freq: Once | INTRAMUSCULAR | Status: AC
  Administered 2021-12-25: 4 mg via INTRAVENOUS
  Filled 2021-12-25: qty 2

## 2021-12-25 MED ORDER — HEPARIN SODIUM (PORCINE) 5000 UNIT/ML IJ SOLN
5000.0000 [IU] | Freq: Three times a day (TID) | INTRAMUSCULAR | Status: DC
Start: 2021-12-25 — End: 2021-12-26

## 2021-12-25 MED ORDER — ACETAMINOPHEN 325 MG PO TABS
650.0000 mg | ORAL_TABLET | Freq: Four times a day (QID) | ORAL | Status: DC | PRN
  Administered 2021-12-26 – 2021-12-28 (×3): 650 mg via ORAL
  Filled 2021-12-25 (×3): qty 2

## 2021-12-25 MED ORDER — GADOBUTROL 1 MMOL/ML IV SOLN
10.0000 mL | Freq: Once | INTRAVENOUS | Status: AC | PRN
  Administered 2021-12-25: 10 mL via INTRAVENOUS

## 2021-12-25 MED ORDER — BUPROPION HCL ER (XL) 150 MG PO TB24
450.0000 mg | ORAL_TABLET | Freq: Every day | ORAL | Status: DC
  Administered 2021-12-26 – 2021-12-29 (×4): 450 mg via ORAL
  Filled 2021-12-25 (×4): qty 3

## 2021-12-25 MED ORDER — LACTATED RINGERS IV BOLUS
1000.0000 mL | Freq: Once | INTRAVENOUS | Status: AC
  Administered 2021-12-25: 1000 mL via INTRAVENOUS

## 2021-12-25 MED ORDER — LEVOTHYROXINE SODIUM 75 MCG PO TABS
150.0000 ug | ORAL_TABLET | Freq: Every day | ORAL | Status: DC
  Administered 2021-12-27 – 2021-12-29 (×3): 150 ug via ORAL
  Filled 2021-12-25 (×3): qty 2

## 2021-12-25 MED ORDER — SODIUM CHLORIDE 0.9 % IV BOLUS
500.0000 mL | Freq: Once | INTRAVENOUS | Status: AC
  Administered 2021-12-25: 500 mL via INTRAVENOUS

## 2021-12-25 MED ORDER — SODIUM CHLORIDE 0.9 % IV SOLN
INTRAVENOUS | Status: DC
Start: 2021-12-25 — End: 2021-12-26

## 2021-12-25 MED ORDER — IOHEXOL 300 MG/ML  SOLN
100.0000 mL | Freq: Once | INTRAMUSCULAR | Status: AC | PRN
  Administered 2021-12-25: 100 mL via INTRAVENOUS

## 2021-12-25 MED ORDER — HYDROMORPHONE HCL 1 MG/ML IJ SOLN: 1.0000 mg | INTRAMUSCULAR | Status: DC | PRN

## 2021-12-25 MED ORDER — ONDANSETRON HCL 4 MG/2ML IJ SOLN
4.0000 mg | Freq: Four times a day (QID) | INTRAMUSCULAR | Status: DC | PRN
  Administered 2021-12-25 – 2021-12-29 (×4): 4 mg via INTRAVENOUS
  Filled 2021-12-25 (×4): qty 2

## 2021-12-25 MED ORDER — HYDROMORPHONE HCL 1 MG/ML IJ SOLN: 1.0000 mg | Freq: Once | INTRAMUSCULAR | Status: DC

## 2021-12-25 MED ORDER — SODIUM CHLORIDE 0.9 % IV SOLN: INTRAVENOUS | Status: DC

## 2021-12-25 NOTE — ED Provider Notes (Signed)
[facility] EMERGENCY DEPARTMENT Provider Note   CSN: [id number] Arrival date & time: 12/25/21  7846     History  Chief Complaint  Patient presents with   Abdominal Pain    Cynthia Donaldson is a 66 y.o. female.  Patient with history of Karlene Lineman and primary biliary cholangitis presents to the emerged department for evaluation of abdominal pain with nausea and vomiting.  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  She reports this feels like a stone.       Home Medications Prior to Admission medications   Medication Sig Start Date End Date Taking? Authorizing Provider  fluconazole (DIFLUCAN) 150 MG tablet Take 1 now and repeat 1 in 3 days 07/09/21   Estill Dooms, NP  buPROPion (WELLBUTRIN XL) 150 MG 24 hr tablet TAKE 3 TABLETS BY MOUTH ONCE A DAY 05/09/21     dicyclomine (BENTYL) 10 MG capsule Take 1 capsule (10 mg total) by mouth 2 (two) times daily before a meal. 06/07/21   Rehman, Mechele Dawley, MD  levothyroxine (SYNTHROID) 150 MCG tablet TAKE 1 TABLET BY MOUTH ONCE DAILY 05/09/21     neomycin-polymyxin-hydrocortisone (CORTISPORIN) 3.5-10000-1 OTIC suspension Place 4 drops into the left ear 4 (four) times daily. X 7 days 03/17/21   Daleen Bo, MD  Obeticholic Acid (OCALIVA) 5 MG TABS Take 1 tablet by mouth daily. 09/21/21   Rehman, Mechele Dawley, MD  triamcinolone ointment (KENALOG) 0.5 % Apply 1 application topically 2 (two) times daily. 07/04/21   Estill Dooms, NP  ursodiol (ACTIGALL) 500 MG tablet TAKE 1 TABLET BY MOUTH 3 TIMES DAILY 05/09/21         Allergies    Codeine    Review of Systems   Review of Systems  Physical Exam Updated Vital Signs BP 126/80   Pulse 78   Temp 98 F (36.7 C) (Axillary)   Resp 18   Ht '5\' 9"'$  (1.753 m)   Wt 106.6 kg   SpO2 98%   BMI 34.70 kg/m  Physical Exam Vitals and nursing note reviewed.  Constitutional:      General: She is in acute distress.     Appearance: She is well-developed.  HENT:     Head:  Normocephalic and atraumatic.     Mouth/Throat:     Mouth: Mucous membranes are moist.  Eyes:     General: Vision grossly intact. Gaze aligned appropriately.     Extraocular Movements: Extraocular movements intact.     Conjunctiva/sclera: Conjunctivae normal.  Cardiovascular:     Rate and Rhythm: Normal rate and regular rhythm.     Pulses: Normal pulses.     Heart sounds: Normal heart sounds, S1 normal and S2 normal. No murmur heard.    No friction rub. No gallop.  Pulmonary:     Effort: Pulmonary effort is normal. No respiratory distress.     Breath sounds: Normal breath sounds.  Abdominal:     General: Bowel sounds are normal.     Palpations: Abdomen is soft.     Tenderness: There is generalized abdominal tenderness. There is no guarding or rebound.     Hernia: No hernia is present.  Musculoskeletal:        General: No swelling.     Cervical back: Full passive range of motion without pain, normal range of motion and neck supple. No spinous process tenderness or muscular tenderness. Normal range of motion.     Right lower leg: No edema.     Left  lower leg: No edema.  Skin:    General: Skin is warm and dry.     Capillary Refill: Capillary refill takes less than 2 seconds.     Findings: No ecchymosis, erythema, rash or wound.  Neurological:     General: No focal deficit present.     Mental Status: She is alert and oriented to person, place, and time.     GCS: GCS eye subscore is 4. GCS verbal subscore is 5. GCS motor subscore is 6.     Cranial Nerves: Cranial nerves 2-12 are intact.     Sensory: Sensation is intact.     Motor: Motor function is intact.     Coordination: Coordination is intact.  Psychiatric:        Attention and Perception: Attention normal.        Mood and Affect: Mood normal.        Speech: Speech normal.        Behavior: Behavior normal.     ED Results / Procedures / Treatments   Labs (all labs ordered are listed, but only abnormal results are  displayed) Labs Reviewed  CBC WITH DIFFERENTIAL/PLATELET  BASIC METABOLIC PANEL  HEPATIC FUNCTION PANEL  LIPASE, BLOOD  LACTIC ACID, PLASMA    EKG EKG Interpretation  Date/Time:  Wednesday December 25 2021 05:58:20 EDT Ventricular Rate:  74 PR Interval:  149 QRS Duration: 96 QT Interval:  423 QTC Calculation: 470 R Axis:   3 Text Interpretation: Sinus rhythm Normal ECG Confirmed by Orpah Greek [id number] on 12/25/2021 6:03:38 AM  Radiology No results found.  Procedures Procedures    Medications Ordered in ED Medications  sodium chloride 0.9 % bolus 500 mL (500 mLs Intravenous New Bag/Given 12/25/21 0545)  ondansetron (ZOFRAN) injection 4 mg (4 mg Intravenous Given 12/25/21 0545)  HYDROmorphone (DILAUDID) injection 1 mg (1 mg Intravenous Given 12/25/21 1694)    ED Course/ Medical Decision Making/ A&P                           Medical Decision Making Amount and/or Complexity of Data Reviewed Labs: ordered.  Risk Prescription drug management.   Patient presents to the emergency department for evaluation of abdominal pain with nausea and vomiting.  This is in the setting of a patient who has a history of prior cholecystectomy, primary biliary cholangitis, biliary stenosis with prior choledocholithiasis.  Initiate supportive care with fluids, antiemetics, analgesia.  Labs ordered, will require advanced imaging, likely CT.  Will sign to oncoming ER physician to follow results.        Final Clinical Impression(s) / ED Diagnoses Final diagnoses:  Generalized abdominal pain    Rx / DC Orders ED Discharge Orders     None         Hipolito Martinezlopez, Gwenyth Allegra, MD 12/25/21 [phone]

## 2021-12-25 NOTE — Assessment & Plan Note (Signed)
-   In the setting of choledocholithiasis -Analgesics and antiemetics will be provided. -Maintain adequate hydration and follow clinical response.

## 2021-12-25 NOTE — Assessment & Plan Note (Signed)
-   No suicidal ideation or hallucination -Continue the use of bupropion.

## 2021-12-25 NOTE — Assessment & Plan Note (Signed)
Continue Synthroid °

## 2021-12-25 NOTE — H&P (Signed)
History and Physical    Patient: Cynthia Donaldson BZJ:[id number] DOB: Jul 05, 1955 DOA: 12/25/2021 DOS: the patient was seen and examined on 12/25/2021 PCP: Asencion Noble, MD  Patient coming from: Home  Chief Complaint:  Chief Complaint  Patient presents with   Abdominal Pain   HPI: Cynthia Donaldson is a 66 y.o. female with medical history significant of Cynthia Donaldson, class II obesity, depression, hypothyroidism and PBC; who presented to the emergency department secondary to abdominal pain (epigastric/right upper quadrant in location, sudden, worsening, with associated nausea/vomiting and decreased appetite; patient reported pain to be 7-8 out of 10 at its worse, crampy in nature and without radiation).  Patient reported vomiting spells with mainly gastric content and she did not see any blood.  Patient reports fever, sick contacts, coughing spells, chest pain, dysuria, hematuria, melena, hematochezia, focal weakness or any other complaints.  Patient reported to be compliant with her medications and was otherwise in no acute distress.  Work-up in the ED demonstrated elevated LFTs and hyperbilirubinemia; images suggesting choledocholithiasis with retained stone and increase bile duct diameter.  Case was discussed with gastroenterology service with recommendations to transfer patient to [facility] for ERCP.  [facility] contacted to facilitate patient's transfer and further care.  Review of Systems: As mentioned in the history of present illness. All other systems reviewed and are negative. Past Medical History:  Diagnosis Date   Bile duct stenosis    Hyperlipidemia    Hypothyroidism    PONV (postoperative nausea and vomiting)    Sleep apnea    Past Surgical History:  Procedure Laterality Date   anal fissure repair     CHOLECYSTECTOMY     COLONOSCOPY N/A 12/02/2017   Procedure: COLONOSCOPY;  Surgeon: Rogene Houston, MD;  Location: [facility];  Service: Endoscopy;  Laterality: N/A;  1030   ERCP       X3   MYOMECTOMY     TONSILLECTOMY     Social History:  reports that she quit smoking about 19 months ago. Her smoking use included cigarettes. She has never used smokeless tobacco. She reports current alcohol use. She reports that she does not use drugs.  Allergies  Allergen Reactions   Codeine Nausea And Vomiting    Family History  Problem Relation Age of Onset   Heart disease Other    Arthritis Other    Cancer Other    Hypertension Father    Cancer Father        stomach   Heart disease Mother    Colon cancer Brother    Suicidality Sister    Suicidality Brother    Hypertension Brother    Arthritis Brother    Hypertension Brother    Arthritis Brother    Hypertension Brother    Arthritis Brother     Prior to Admission medications   Medication Sig Start Date End Date Taking? Authorizing Provider  buPROPion (WELLBUTRIN XL) 150 MG 24 hr tablet TAKE 3 TABLETS BY MOUTH ONCE A DAY 05/09/21  Yes   dicyclomine (BENTYL) 10 MG capsule Take 1 capsule (10 mg total) by mouth 2 (two) times daily before a meal. Patient taking differently: Take 10 mg by mouth 2 (two) times daily as needed for spasms. 06/07/21  Yes Rehman, Mechele Dawley, MD  fluconazole (DIFLUCAN) 150 MG tablet Take 1 now and repeat 1 in 3 days Patient not taking: Reported on 12/25/2021 07/09/21   Estill Dooms, NP  levothyroxine (SYNTHROID) 150 MCG tablet TAKE 1 TABLET BY  MOUTH ONCE DAILY 25/85/27  Yes   Obeticholic Acid (OCALIVA) 5 MG TABS Take 1 tablet by mouth daily. Patient taking differently: Take 5 mg by mouth at bedtime. 09/21/21  Yes Rehman, Mechele Dawley, MD  ursodiol (ACTIGALL) 500 MG tablet TAKE 1 TABLET BY MOUTH 3 TIMES DAILY 05/09/21  Yes   neomycin-polymyxin-hydrocortisone (CORTISPORIN) 3.5-10000-1 OTIC suspension Place 4 drops into the left ear 4 (four) times daily. X 7 days Patient not taking: Reported on 12/25/2021 03/17/21   Daleen Bo, MD  triamcinolone ointment (KENALOG) 0.5 % Apply 1 application topically 2  (two) times daily. Patient not taking: Reported on 12/25/2021 07/04/21   Estill Dooms, NP    Physical Exam: Vitals:   12/25/21 0528 12/25/21 1151 12/25/21 1453 12/25/21 1759  BP: 126/80 110/66 106/68 104/70  Pulse: 78 86 68 65  Resp: '18 18 18 18  '$ Temp: 98 F (36.7 C) 98.4 F (36.9 C) 97.6 F (36.4 C) 97.9 F (36.6 C)  TempSrc: Axillary Oral Oral Oral  SpO2: 98% 94% 94% 99%  Weight:      Height:       General exam: Alert, awake, oriented x 3; reporting still some discomfort in her mid epigastric area/right upper quadrant.  No chest pain, no shortness of breath, good saturation on room air.  After pain medications and antiemetics she feels relief and would like something to drink. Respiratory system: Clear to auscultation. Respiratory effort normal.  No using accessory muscles. Cardiovascular system:RRR. No murmurs, rubs, gallops.  No JVD. Gastrointestinal system: Abdomen is obese, tender to palpation midepigastric/right upper quadrant; no guarding. No organomegaly or masses felt. Normal bowel sounds heard. Central nervous system: Alert and oriented. No focal neurological deficits. Extremities: No cyanosis, clubbing or edema. Skin: No petechiae.  Patient reports having some itching (probably due to hyperbilirubinemia) Psychiatry: Judgement and insight appear normal. Mood & affect appropriate.   Data Reviewed: CBC: WBCs 9.8, hemoglobin 11.7, platelet count 782 K Basic metabolic panel: Sodium 423, potassium 4.4, chloride 108, BUN 24, creatinine 0.91 CBG: 130 Lipase 41 Lactic acid 2.7>>1.8 MRCP: 1. Exam positive for choledocholithiasis. Solitary stone within the distal common bile duct measures 1.4 cm in length with a diameter of 5 mm. 2. Diffuse hepatic steatosis with morphologic features of liver compatible with cirrhosis. 3. Multiple enlarged lymph nodes within the upper abdomen, nonspecific in the setting of cirrhosis. 4. Mild splenomegaly.  Assessment and Plan: *  Choledocholithiasis - Elevated LFTs and bilirubin up to 6 appreciated at time of admission -MRCP images demonstrating choledocholithiasis with solitary stone within the distal common bile.measuring 1.4 cm in length with a diameter of 5 mm. -Case discussed with [place] gastroenterology service (Dr. Rush Landmark) who has recommended transfer to [facility] for ERCP. -No signs of acute cholangitis; patient afebrile with normal WBCs -Will provide fluid resuscitation's, as needed antiemetics and analgesics. -Follow clinical response -N.p.o. after midnight; clear liquid diet allo allowed for now.  Depression - No suicidal ideation or hallucination -Continue the use of bupropion.  Hypothyroidism - Continue Synthroid   Class 1 obesity -Body mass index is 34.7 kg/m. -Low calorie diet, portion control and increase physical activity discussed with patient.  Abdominal pain, epigastric - In the setting of choledocholithiasis -Analgesics and antiemetics will be provided. -Maintain adequate hydration and follow clinical response.  Nonalcoholic steatohepatitis (NASH) - Continue outpatient follow-up with GI. -Heart healthy diet and weight loss discussed with patient.  Primary biliary cholangitis (HCC) - Holding outpatient drugs in the setting of increased LFTs  currently -Resolved at time of discharge -Follow GI service recommendations.  Transaminitis/hyperbilirubinemia -Secondary to choledocholithiasis -follow trend -planning for ERCP on 12/26/21    Advance Care Planning:   Code Status: Full Code   Consults: full code  Family Communication: no family at bedside.  Severity of Illness: The appropriate patient status for this patient is INPATIENT. Inpatient status is judged to be reasonable and necessary in order to provide the required intensity of service to ensure the patient's safety. The patient's presenting symptoms, physical exam findings, and initial radiographic and  laboratory data in the context of their chronic comorbidities is felt to place them at high risk for further clinical deterioration. Furthermore, it is not anticipated that the patient will be medically stable for discharge from the hospital within 2 midnights of admission.   * I certify that at the point of admission it is my clinical judgment that the patient will require inpatient hospital care spanning beyond 2 midnights from the point of admission due to high intensity of service, high risk for further deterioration and high frequency of surveillance required.*  Author: Barton Dubois, MD 12/25/2021 7:00 PM  For on call review [email or web address].

## 2021-12-25 NOTE — ED Provider Notes (Signed)
  Physical Exam  BP 110/66 (BP Location: Right Arm)   Pulse 86   Temp 98.4 F (36.9 C) (Oral)   Resp 18   Ht '5\' 9"'$  (1.753 m)   Wt 106.6 kg   SpO2 94%   BMI 34.70 kg/m   Physical Exam Vitals and nursing note reviewed.  Constitutional:      General: She is not in acute distress.    Appearance: She is well-developed.  HENT:     Head: Normocephalic and atraumatic.  Eyes:     Conjunctiva/sclera: Conjunctivae normal.  Cardiovascular:     Rate and Rhythm: Normal rate and regular rhythm.     Heart sounds: No murmur heard. Pulmonary:     Effort: Pulmonary effort is normal. No respiratory distress.     Breath sounds: Normal breath sounds.  Abdominal:     Palpations: Abdomen is soft.     Tenderness: There is abdominal tenderness in the right upper quadrant.  Musculoskeletal:        General: No swelling.     Cervical back: Neck supple.  Skin:    General: Skin is warm and dry.     Capillary Refill: Capillary refill takes less than 2 seconds.  Neurological:     Mental Status: She is alert.  Psychiatric:        Mood and Affect: Mood normal.     Procedures  Procedures  ED Course / MDM    Medical Decision Making Amount and/or Complexity of Data Reviewed Labs: ordered. Radiology: ordered.  Risk Prescription drug management. Decision regarding hospitalization.   Patient received in handoff.  Abdominal pain pending labs and CT.  Labs with a significant elevation in her bilirubin concerning for biliary obstruction.  Initial CT showing some new pneumobilia and I spoke to the on-call gastroenterologist who just happens to be the patient's husband Dr. Gala Romney who is recommending an MRCP.  MRCP obtained that shows new choledocholithiasis.  Dr. Gala Romney spoke with the [facility] GI physician on-call Dr. Rush Landmark who will arrange for ERCP tomorrow.  Patient then admitted to medicine for admission to [facility].       Teressa Lower, MD 12/25/21 [phone]

## 2021-12-25 NOTE — Assessment & Plan Note (Signed)
-   Holding outpatient drugs in the setting of increased LFTs currently -Resolved at time of discharge -Follow GI service recommendations.

## 2021-12-25 NOTE — ED Notes (Signed)
Pt back from CT

## 2021-12-25 NOTE — Progress Notes (Signed)
Pt admitted for ERCP. A&OX4 no c/o pain at this time. NS@ 75. No tele, pt not diabetic. Pt oriented to floor placed on NPO status, signed consent. No other needs voiced at the time. BP 105/65 (BP Location: Left Arm)   Pulse 66   Temp 98.5 F (36.9 C) (Oral)   Resp 20   Ht '5\' 9"'$  (1.753 m)   Wt 106.6 kg   SpO2 98%   BMI 34.70 kg/m   Cynthia Donaldson 12/25/21 11:15 PM

## 2021-12-25 NOTE — ED Triage Notes (Signed)
Pt c/o right upper abd pain intermittently over last couple of days.

## 2021-12-25 NOTE — Assessment & Plan Note (Signed)
-  Body mass index is 34.7 kg/m. -Low calorie diet, portion control and increase physical activity discussed with patient.

## 2021-12-25 NOTE — Assessment & Plan Note (Signed)
-   Continue outpatient follow-up with GI. -Heart healthy diet and weight loss discussed with patient.

## 2021-12-25 NOTE — Assessment & Plan Note (Signed)
-   Elevated LFTs and bilirubin up to 6 appreciated at time of admission -MRCP images demonstrating choledocholithiasis with solitary stone within the distal common bile.measuring 1.4 cm in length with a diameter of 5 mm. -Case discussed with [place] gastroenterology service (Dr. Rush Landmark) who has recommended transfer to [facility] for ERCP. -No signs of acute cholangitis; patient afebrile with normal WBCs -Will provide fluid resuscitation's, as needed antiemetics and analgesics. -Follow clinical response -N.p.o. after midnight; clear liquid diet allo allowed for now.

## 2021-12-25 NOTE — ED Notes (Signed)
Pt asked if transport had been called checked with sec. [facility] has been called but no eta given informed pt.

## 2021-12-26 ENCOUNTER — Inpatient Hospital Stay (HOSPITAL_COMMUNITY): Payer: 59

## 2021-12-26 ENCOUNTER — Inpatient Hospital Stay (HOSPITAL_COMMUNITY): Payer: 59 | Admitting: Certified Registered Nurse Anesthetist

## 2021-12-26 ENCOUNTER — Encounter (HOSPITAL_COMMUNITY): Payer: Self-pay | Admitting: Internal Medicine

## 2021-12-26 ENCOUNTER — Encounter (HOSPITAL_COMMUNITY)
Admission: EM | Disposition: A | Discharge status: Home / Self Care | Payer: Self-pay | Source: Home / Self Care | Attending: Internal Medicine

## 2021-12-26 DIAGNOSIS — K8050 Calculus of bile duct without cholangitis or cholecystitis without obstruction: Secondary | ICD-10-CM | POA: Diagnosis not present

## 2021-12-26 DIAGNOSIS — K7581 Nonalcoholic steatohepatitis (NASH): Secondary | ICD-10-CM | POA: Diagnosis not present

## 2021-12-26 DIAGNOSIS — R7881 Bacteremia: Secondary | ICD-10-CM

## 2021-12-26 DIAGNOSIS — K8021 Calculus of gallbladder without cholecystitis with obstruction: Secondary | ICD-10-CM

## 2021-12-26 DIAGNOSIS — K2960 Other gastritis without bleeding: Secondary | ICD-10-CM

## 2021-12-26 DIAGNOSIS — K743 Primary biliary cirrhosis: Secondary | ICD-10-CM | POA: Diagnosis not present

## 2021-12-26 DIAGNOSIS — B9689 Other specified bacterial agents as the cause of diseases classified elsewhere: Secondary | ICD-10-CM

## 2021-12-26 DIAGNOSIS — R7989 Other specified abnormal findings of blood chemistry: Secondary | ICD-10-CM

## 2021-12-26 DIAGNOSIS — R932 Abnormal findings on diagnostic imaging of liver and biliary tract: Secondary | ICD-10-CM

## 2021-12-26 DIAGNOSIS — R17 Unspecified jaundice: Secondary | ICD-10-CM

## 2021-12-26 DIAGNOSIS — K838 Other specified diseases of biliary tract: Secondary | ICD-10-CM

## 2021-12-26 DIAGNOSIS — K449 Diaphragmatic hernia without obstruction or gangrene: Secondary | ICD-10-CM

## 2021-12-26 HISTORY — PX: BILIARY DILATION: SHX6850

## 2021-12-26 HISTORY — PX: REMOVAL OF STONES: SHX5545

## 2021-12-26 HISTORY — PX: ERCP: SHX5425

## 2021-12-26 HISTORY — PX: BIOPSY: SHX5522

## 2021-12-26 HISTORY — PX: SPHINCTEROTOMY: SHX5279

## 2021-12-26 HISTORY — PX: ESOPHAGOGASTRODUODENOSCOPY: SHX5428

## 2021-12-26 LAB — CBC
HCT: 26.2 % — ABNORMAL LOW (ref 36.0–46.0)
Hemoglobin: 8.8 g/dL — ABNORMAL LOW (ref 12.0–15.0)
MCH: 34.4 pg — ABNORMAL HIGH (ref 26.0–34.0)
MCHC: 33.6 g/dL (ref 30.0–36.0)
MCV: 102.3 fL — ABNORMAL HIGH (ref 80.0–100.0)
Platelets: 203 10*3/uL (ref 150–400)
RBC: 2.56 MIL/uL — ABNORMAL LOW (ref 3.87–5.11)
RDW: 15.6 % — ABNORMAL HIGH (ref 11.5–15.5)
WBC: 10.9 10*3/uL — ABNORMAL HIGH (ref 4.0–10.5)
nRBC: 0 % (ref 0.0–0.2)

## 2021-12-26 LAB — COMPREHENSIVE METABOLIC PANEL
ALT: 86 U/L — ABNORMAL HIGH (ref 0–44)
AST: 55 U/L — ABNORMAL HIGH (ref 15–41)
Albumin: 3 g/dL — ABNORMAL LOW (ref 3.5–5.0)
Alkaline Phosphatase: 107 U/L (ref 38–126)
Anion gap: 5 (ref 5–15)
BUN: 24 mg/dL — ABNORMAL HIGH (ref 8–23)
CO2: 24 mmol/L (ref 22–32)
Calcium: 8.2 mg/dL — ABNORMAL LOW (ref 8.9–10.3)
Chloride: 109 mmol/L (ref 98–111)
Creatinine, Ser: 0.95 mg/dL (ref 0.44–1.00)
GFR, Estimated: >60 mL/min (ref >60)
Glucose, Bld: 87 mg/dL (ref 70–99)
Potassium: 3.9 mmol/L (ref 3.5–5.1)
Sodium: 138 mmol/L (ref 135–145)
Total Bilirubin: 4.1 mg/dL — ABNORMAL HIGH (ref 0.3–1.2)
Total Protein: 5.6 g/dL — ABNORMAL LOW (ref 6.5–8.1)

## 2021-12-26 LAB — BLOOD CULTURE ID PANEL (REFLEXED) - BCID2

## 2021-12-26 LAB — ECHOCARDIOGRAM COMPLETE
Area-P 1/2: 2.91 cm2
Height: 69 in
S' Lateral: 3.4 cm
Weight: 3760 oz

## 2021-12-26 LAB — FERRITIN: Ferritin: 362 ng/mL — ABNORMAL HIGH (ref 11–307)

## 2021-12-26 LAB — IRON AND TIBC
Iron: 129 ug/dL (ref 28–170)
Saturation Ratios: 35 % — ABNORMAL HIGH (ref 10.4–31.8)
TIBC: 374 ug/dL (ref 250–450)
UIBC: 245 ug/dL

## 2021-12-26 LAB — TYPE AND SCREEN
ABO/RH(D): A POS
Antibody Screen: POSITIVE
DAT, IgG: POSITIVE

## 2021-12-26 LAB — FOLATE: Folate: 3.8 ng/mL — ABNORMAL LOW (ref >5.9)

## 2021-12-26 LAB — MAGNESIUM: Magnesium: 2.1 mg/dL (ref 1.7–2.4)

## 2021-12-26 LAB — PHOSPHORUS: Phosphorus: 2.6 mg/dL (ref 2.5–4.6)

## 2021-12-26 LAB — HIV ANTIBODY (ROUTINE TESTING W REFLEX): HIV Screen 4th Generation wRfx: NONREACTIVE

## 2021-12-26 LAB — VITAMIN B12: Vitamin B-12: 207 pg/mL (ref 180–914)

## 2021-12-26 SURGERY — ERCP, WITH INTERVENTION IF INDICATED
Anesthesia: General

## 2021-12-26 MED ORDER — DEXAMETHASONE SODIUM PHOSPHATE 10 MG/ML IJ SOLN
INTRAMUSCULAR | Status: DC | PRN
  Administered 2021-12-26: 10 mg via INTRAVENOUS

## 2021-12-26 MED ORDER — EPHEDRINE SULFATE-NACL 50-0.9 MG/10ML-% IV SOSY
PREFILLED_SYRINGE | INTRAVENOUS | Status: DC | PRN
  Administered 2021-12-26: 5 mg via INTRAVENOUS

## 2021-12-26 MED ORDER — ONDANSETRON HCL 4 MG/2ML IJ SOLN
INTRAMUSCULAR | Status: DC | PRN
  Administered 2021-12-26: 4 mg via INTRAVENOUS

## 2021-12-26 MED ORDER — VITAMIN B-12 1000 MCG PO TABS
1000.0000 ug | ORAL_TABLET | Freq: Every day | ORAL | Status: DC
  Administered 2021-12-27 – 2021-12-29 (×3): 1000 ug via ORAL
  Filled 2021-12-26 (×3): qty 1

## 2021-12-26 MED ORDER — PHENYLEPHRINE HCL-NACL 20-0.9 MG/250ML-% IV SOLN
INTRAVENOUS | Status: DC | PRN
  Administered 2021-12-26: 25 ug/min via INTRAVENOUS

## 2021-12-26 MED ORDER — VANCOMYCIN HCL 1500 MG/300ML IV SOLN
1500.0000 mg | INTRAVENOUS | Status: DC
  Administered 2021-12-27 – 2021-12-28 (×2): 1500 mg via INTRAVENOUS
  Filled 2021-12-26 (×3): qty 300

## 2021-12-26 MED ORDER — GLUCAGON HCL RDNA (DIAGNOSTIC) 1 MG IJ SOLR
INTRAMUSCULAR | Status: DC | PRN
  Administered 2021-12-26: .25 mg via INTRAVENOUS

## 2021-12-26 MED ORDER — ROCURONIUM BROMIDE 10 MG/ML (PF) SYRINGE
PREFILLED_SYRINGE | INTRAVENOUS | Status: DC | PRN
  Administered 2021-12-26: 70 mg via INTRAVENOUS

## 2021-12-26 MED ORDER — DICLOFENAC SUPPOSITORY 100 MG
RECTAL | Status: AC
  Filled 2021-12-26: qty 1

## 2021-12-26 MED ORDER — METRONIDAZOLE 500 MG/100ML IV SOLN
500.0000 mg | Freq: Two times a day (BID) | INTRAVENOUS | Status: DC
  Administered 2021-12-26 – 2021-12-27 (×3): 500 mg via INTRAVENOUS
  Filled 2021-12-26 (×3): qty 100

## 2021-12-26 MED ORDER — VANCOMYCIN HCL 10 G IV SOLR
2500.0000 mg | Freq: Once | INTRAVENOUS | Status: AC
  Administered 2021-12-26: 2500 mg via INTRAVENOUS
  Filled 2021-12-26: qty 2500

## 2021-12-26 MED ORDER — FOLIC ACID 1 MG PO TABS
1.0000 mg | ORAL_TABLET | Freq: Every day | ORAL | Status: DC
Start: 2021-12-27 — End: 2021-12-29
  Administered 2021-12-27 – 2021-12-29 (×3): 1 mg via ORAL
  Filled 2021-12-26 (×3): qty 1

## 2021-12-26 MED ORDER — FOLIC ACID 5 MG/ML IJ SOLN
1.0000 mg | Freq: Once | INTRAMUSCULAR | Status: AC
  Administered 2021-12-26: 1 mg via INTRAVENOUS
  Filled 2021-12-26: qty 0.2

## 2021-12-26 MED ORDER — SODIUM CHLORIDE 0.9 % IV SOLN
2.0000 g | INTRAVENOUS | Status: DC
  Administered 2021-12-26 – 2021-12-27 (×2): 2 g via INTRAVENOUS
  Filled 2021-12-26 (×2): qty 20

## 2021-12-26 MED ORDER — ENOXAPARIN SODIUM 60 MG/0.6ML IJ SOSY
50.0000 mg | PREFILLED_SYRINGE | Freq: Every day | INTRAMUSCULAR | Status: DC
  Administered 2021-12-27 – 2021-12-28 (×2): 50 mg via SUBCUTANEOUS
  Filled 2021-12-26 (×3): qty 0.6

## 2021-12-26 MED ORDER — SUGAMMADEX SODIUM 200 MG/2ML IV SOLN
INTRAVENOUS | Status: DC | PRN
  Administered 2021-12-26: 350 mg via INTRAVENOUS

## 2021-12-26 MED ORDER — DICLOFENAC SUPPOSITORY 100 MG
RECTAL | Status: DC | PRN
  Administered 2021-12-26: 100 mg via RECTAL

## 2021-12-26 MED ORDER — INDOMETHACIN 50 MG RE SUPP
RECTAL | Status: AC
  Filled 2021-12-26: qty 2

## 2021-12-26 MED ORDER — GLUCAGON HCL RDNA (DIAGNOSTIC) 1 MG IJ SOLR
INTRAMUSCULAR | Status: AC
  Filled 2021-12-26: qty 1

## 2021-12-26 MED ORDER — CYANOCOBALAMIN 1000 MCG/ML IJ SOLN
1000.0000 ug | Freq: Once | INTRAMUSCULAR | Status: AC
  Administered 2021-12-26: 1000 ug via INTRAMUSCULAR
  Filled 2021-12-26: qty 1

## 2021-12-26 MED ORDER — PROPOFOL 10 MG/ML IV BOLUS
INTRAVENOUS | Status: DC | PRN
  Administered 2021-12-26: 200 mg via INTRAVENOUS

## 2021-12-26 MED ORDER — LIDOCAINE 2% (20 MG/ML) 5 ML SYRINGE
INTRAMUSCULAR | Status: DC | PRN
  Administered 2021-12-26: 100 mg via INTRAVENOUS

## 2021-12-26 MED ORDER — MIDAZOLAM HCL 2 MG/2ML IJ SOLN
INTRAMUSCULAR | Status: DC | PRN
  Administered 2021-12-26: 2 mg via INTRAVENOUS

## 2021-12-26 MED ORDER — FENTANYL CITRATE (PF) 250 MCG/5ML IJ SOLN
INTRAMUSCULAR | Status: DC | PRN
Start: 2021-12-26 — End: 2021-12-26
  Administered 2021-12-26: 50 ug via INTRAVENOUS

## 2021-12-26 NOTE — Op Note (Signed)
[facility] Patient Name: Cynthia Donaldson Procedure Date : 12/26/2021 MRN: [id number] Attending MD: Justice Britain , MD Date of Birth: 03-Aug-1955 CSN: [id number] Age: 66 Admit Type: Inpatient Procedure:                ERCP Indications:              Bile duct stone(s), Abnormal MRCP, Jaundice,                            Abnormal liver function test, Prior Endoscopic                            Retrograde Cholangiopancreatography Providers:                Justice Britain, MD, Burtis Junes, RN, Luan Moore, Technician, Elmer Sow Referring MD:             Asencion Noble, Hospitalists Medicines:                General Anesthesia, Certriaxione administered (as                            it was scheduled/due this AM), Glucagon 0.5 mg IV,                            Diclofenac 828 mg rectal Complications:            No immediate complications. Estimated Blood Loss:     Estimated blood loss was minimal. Procedure:                Pre-Anesthesia Assessment:                           - Prior to the procedure, a History and Physical                            was performed, and patient medications and                            allergies were reviewed. The patient's tolerance of                            previous anesthesia was also reviewed. The risks                            and benefits of the procedure and the sedation                            options and risks were discussed with the patient.                            All questions were answered, and informed consent  was obtained. Prior Anticoagulants: The patient has                            taken no previous anticoagulant or antiplatelet                            agents. ASA Grade Assessment: III - A patient with                            severe systemic disease. After reviewing the risks                            and benefits, the patient was deemed in                             satisfactory condition to undergo the procedure.                           After obtaining informed consent, the scope was                            passed under direct vision. Throughout the                            procedure, the patient's blood pressure, pulse, and                            oxygen saturations were monitored continuously. The                            [id number]-H190 ([id number]) Olympus endoscope was introduced                            through the mouth, and used to inject contrast into                            and used to locate the major papilla. The [id number]-[id number]                            ([id number]) Olympus duodenoscope was introduced                            through the mouth, and used to inject contrast into                            and used to inject contrast into the bile duct. The                            ERCP was accomplished without difficulty. The                            patient tolerated the procedure. Scope In: Scope Out: Findings:      A scout film of the  abdomen was obtained. Surgical clips, consistent       with a previous cholecystectomy, were seen in the area of the right       upper quadrant of the abdomen.      A standard esophagogastroduodenoscopy scope was used for the examination       of the upper gastrointestinal tract. The scope was passed under direct       vision through the upper GI tract. No gross lesions were noted in the       entire esophagus. The Z-line was irregular and was found 40 cm from the       incisors. There is no endoscopic evidence of varices in the entire       esophagus. A 2 cm hiatal hernia was present. Localized prominent gastric       folds were found in the gastric antrum. Patchy mildly erythematous       mucosa without bleeding was found in the entire examined stomach -       biopsied for HP evaluation. There is no endoscopic evidence of varices       in the entire examined stomach. No gross lesions  were noted in the       duodenal bulb, in the first portion of the duodenum and in the second       portion of the duodenum. A biliary sphincterotomy had been performed.       The sphincterotomy appeared open but its opening was only about 4 mm in       size.      A short 0.035 inch Soft Jagwire was passed into the biliary tree. The       Hydratome sphincterotome was passed over the guidewire and the bile duct       was then deeply cannulated. Contrast was injected. I personally       interpreted the bile duct images. Ductal flow of contrast was adequate.       Image quality was adequate. Contrast extended to the hepatic ducts.       Opacification of the entire biliary tree except for the cystic duct and       gallbladder was successful. The main bile duct was moderately dilated.       The largest diameter was 13 mm. The middle third of the main bile duct       contained a filling defect thought to be a stone. The biliary       sphincterotomy was extended another 4 mm in length with a monofilament       Hydratome sphincterotome using ERBE electrocautery to be a total of 8 mm       in size. There was no post-sphincterotomy bleeding. DASE       sphincteroplasty dilation of the distal common bile duct with an 01-01-09       mm x 5.5 cm CRE balloon (to a maximum balloon size of 10 mm) dilator was       successful for a total of 3 minutes. The biliary tree was swept with a       retrieval balloon. Sludge was swept from the duct. One sludge stone was       removed. No stones remained. An occlusion cholangiogram was performed       that showed no further significant biliary pathology.      A pancreatogram was not performed.      The duodenoscope was withdrawn from the patient. Impression:               -  No gross lesions in esophagus. Z-line irregular,                            40 cm from the incisors. No varices.                           - 2 cm hiatal hernia.                           -  Enlarged antral gastric folds. Erythematous                            mucosa in the stomach. HP biopsies obtained.                           - No gross lesions in the duodenal bulb, in the                            first portion of the duodenum and in the second                            portion of the duodenum.                           - Prior biliary sphincterotomy appeared open but                            was only about 4 mm in length.                           - A filling defect consistent with a stone was seen                            on the cholangiogram.                           - The entire main bile duct was moderately dilated.                           - Choledocholithiasis was found. Complete removal                            was accomplished by biliary sphincterotomy                            extension and balloon DASE sphincteroplasty and                            balloon trawl. Recommendation:           - The patient will be observed post-procedure,                            until all discharge criteria are met.                           -  Return patient to hospital ward for ongoing care.                           - Advance diet as tolerated.                           - Check liver enzymes (AST, ALT, alkaline                            phosphatase, bilirubin) in the morning.                           - Watch for pancreatitis, bleeding, perforation,                            and cholangitis.                           - Observe patient's clinical course.                           - Await path results. Treat for HP if necessary.                           - Inpatient GI team will continue to follow while                            she is inhouse.                           - Overt cholangitis not noted so from an antibiotic                            perspective, will defer further antibiotic                            dosing/needs to the medical service.                            - Hold VTE PPx for 24 hours to decrease risk of                            post-interventional bleeding.                           - The findings and recommendations were discussed                            with the patient.                           - The findings and recommendations were discussed                            with the patient's family.                           -  The findings and recommendations were discussed                            with the referring physician. Procedure Code(s):        --- Professional ---                           [id number], Endoscopic retrograde                            cholangiopancreatography (ERCP); with removal of                            calculi/debris from biliary/pancreatic duct(s)                           43239, Esophagogastroduodenoscopy, flexible,                            transoral; with biopsy, single or multiple Diagnosis Code(s):        --- Professional ---                           K22.8, Other specified diseases of esophagus                           K44.9, Diaphragmatic hernia without obstruction or                            gangrene                           K29.60, Other gastritis without bleeding                           K31.89, Other diseases of stomach and duodenum                           R93.2, Abnormal findings on diagnostic imaging of                            liver and biliary tract                           K80.50, Calculus of bile duct without cholangitis                            or cholecystitis without obstruction                           R17, Unspecified jaundice                           R94.5, Abnormal results of liver function studies                           Z98.890, Other specified postprocedural states  K83.8, Other specified diseases of biliary tract CPT copyright 2019 [employer]. All rights reserved. The codes documented in this report  are preliminary and upon coder review may  be revised to meet current compliance requirements. Justice Britain, MD 12/26/2021 11:28:23 AM Number of Addenda: 0

## 2021-12-26 NOTE — Progress Notes (Signed)
[place] ANTIBIOTIC CONSULT NOTE   Cynthia Donaldson is an 66 y.o. female who presented to [facility] on 12/25/2021 with a chief complaint of abdominal pain, n/v with elevated LFTs and evidence of choledocholithiasis. PMH significant for primary biliary cholangitis. Underwent ERCP 12/26/2021- filling defect c/w stone noted. Complete removal was accomplished by biliary sphincterotomy.  8/3: Scr 0.95 (stable), WBC 10.9  Vital Signs: VSS, afebrile   Estimated Creatinine Clearance: 76.8 mL/min (by C-G formula based on SCr of 0.95 mg/dL).  Plan: Continue ceftriaxone 2g IV Q24H and Flagyl 500 mg IV Q12H pending ID consult  START Vancomycin 2,500 mg IV x 1, followed by Vancomycin 1,500 mg IV Q24H (eAUC 506.1, Scr used 0.95, Vd 0.5) Monitor renal function, clinical status, de-escalation, C/S, levels as indicated   Allergies:  Allergies  Allergen Reactions   Codeine Nausea And Vomiting    Filed Weights   12/25/21 0527  Weight: 106.6 kg (235 lb)       Latest Ref Rng & Units 12/26/2021    1:30 AM 12/25/2021    6:20 AM 12/28/2017    6:11 AM  CBC  WBC 4.0 - 10.5 K/uL 10.9  9.8  7.5   Hemoglobin 12.0 - 15.0 g/dL 8.8  11.7  14.8   Hematocrit 36.0 - 46.0 % 26.2  35.6  45.0   Platelets 150 - 400 K/uL 203  258  248     Antibiotics Given (last 72 hours)     Date/Time Action Medication Dose Rate   12/26/21 0546 New Bag/Given   metroNIDAZOLE (FLAGYL) IVPB 500 mg 500 mg 100 mL/hr   12/26/21 1040 New Bag/Given   cefTRIAXone (ROCEPHIN) 2 g in sodium chloride 0.9 % 100 mL IVPB 2 g        Antimicrobials this admission: 8/3 Ceftriaxone>> 8/3 flagyl>> 8/3 Vancomycin>>   Microbiology results: 8/2 Bcx: 1/4 bottles GPCs>>Enterococcus faecium w/ no resistance mechanisms on BCID   Thank you for allowing pharmacy to be a part of this patient's care.  Adria Dill, PharmD PGY-2 Infectious Diseases Resident  12/26/2021 12:56 PM

## 2021-12-26 NOTE — Progress Notes (Signed)
  Echocardiogram 2D Echocardiogram has been performed.  Cynthia Donaldson 12/26/2021, 3:37 PM

## 2021-12-26 NOTE — Consult Note (Signed)
Referring Provider: Dr. Barton Dubois  Primary Care Physician:  Asencion Noble, MD Primary Gastroenterologist:  Virgina Evener   Reason for Consultation:  Choledocholithiasis   HPI: Cynthia Donaldson is a 66 y.o. 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   She presented to [facility] 12/25/2021 with N/V and RUQ pain. Labs in the ED showed a WBC count of 9.8. Hg 11.7. HCT 35.6. MCV 104.7. PLT 258. Na+ 142. K+ 4.4. BUN 24. Cr 0.91. Ca 8.6. Lipase 41. T. Bili 6.0. Direct bili 3.5. Indirect bili 2.5. Alk phos 141. AST 98. ALT 97. (Comparison LFTS 09/02/2021 T. Bili 0.6. Alk phos 154. AST 47. ALT 66). Blood cultures pending. Lactic acid 2.7 -> 1.6. INR 1.1.  Labs 12/26/2021: T. Bili 4.1. Alk phos 107. AST 55. ALT 86. WBC 10.9. Hg 8.8. HCT 26.2. Iron and B12 levels pending.   CTAP 12/25/2021 identified unchanged prominence of the extrahepatic biliary tree now with subtle peribiliary enhancement, small volume pneumobilia with possible superimposed sequela of a recently passed choledocholithiasis, cirrhotic hepatic morphology with hepatic steatosis and evidence of portal venous hypertension. Abdominal MRI/MRCP identified choledocholithiasis (solitary stone in the CBD, diffuse hepatic steatosis with features of cirrhosis, multiple enlarged lymph nodes in the upper abdomen and mild splenomegaly.   She was transferred transformed to [facility] for ERCP.   She endorses having intermittent episodes of RUQ once monthly for the  past 5 years, these episodes have increased over the past 2 weeks for which she took Bentyl with mild relief.  She awakened Wednesday, 12/25/2021 at 2 AM with severe RUQ pain with nausea and nonbloody emesis.  Infrequent heartburn.  No dysphagia.  She is on Urso 500 mg 3 tabs daily for PBC and she started [place] approximately 2 weeks ago which has resulted in looser stools.  No acholic stools but she noticed her stool was pale brown yesterday.  No rectal bleeding or melena.  Stools are dark after drinking red wine.  Her urine color has been darker for the past 7 days. She infrequently takes Excedrin Migraine for headaches.  She is not on any blood thinners.  She drinks 1 or 2 glasses of wine monthly. She has a history of sphincter of Oddi dysfunction which required a biliary sphincterotomy and stenting in 2009 and a history of choledocholithiasis s/p ERCP with sphincterotomy and stone extraction 12/2010. EGD 11/2017 by Dr. Melony Overly showed a hyperplastic gastric polyp and gastropathy.  A colonoscopy was done on the same date which identified 2 small polyps which were removed from the colon, biopsies were consistent with benign colonic mucosa.  A repeat colonoscopy in 5 years was recommended due to first-degree relative (brother) with history of colon cancer.  No history of cardiac or pulmonary disease.   PAST GI PROCEDURES:  EGD 12/02/2017: Normal esophagus. - Z-line irregular, 39 cm from the incisors. Biopsied. - A single gastric polyp. Biopsied. - Erosive gastropathy. - Normal cardia, gastric fundus, gastric body and pylorus. - Normal duodenal bulb and second portion of the duodenum.  Colonoscopy 12/02/2017: - Two small polyps at the splenic flexure and in the transverse colon.  Biopsied. - Diverticulosis in the sigmoid colon. - External hemorrhoids - 5 year colonoscopy recall (brother with history of colon cancer) 1. Stomach, polyp(s)  Path report: - HYPERPLASTIC GASTRIC POLYP - NO HIGH GRADE  DYSPLASIA OR MALIGNANCY IDENTIFIED 2. Esophagogastric junction, biopsy - SQUAMOCOLUMNAR JUNCTION WITH CHRONIC INFLAMMATION - NO INTESTINAL METAPLASIA, DYSPLASIA OR MALIGNANCY IDENTIFIED 3. Colon, polyp(s), transverse and splenic flexure - POLYPOID FRAGMENTS OF COLONIC MUCOSA WITH LYMPHOID AGGREGATE(S) - NO HIGH GRADE DYSPLASIA OR MALIGNANCY IDENTIFIED  Liver biopsy 01/17/2018: - MILD TO MODERATE LYMPHOCYTIC CHOLANGITIS, CONSISTENT WITH HISTORY OF PRIMARY BILIARY CIRRHOSIS. - EARLY DUCTOPENIA - MODERATE STEATOSIS WITHOUT EVIDENCE OF STEATOHEPATITIS - PORTAL FIBROSIS WITH SEPTA THAT ARE ONLY FOCALLY BRIDGING (OVERALL STAGE 2 OF 4).  ERCP 01/01/2011 by Dr. Christoper Fabian at [facility]: FINDINGS: Contrast is injected into the common bile duct. There is mild  dilatation of the common bile duct and common hepatic duct. There is a  filling defect in the common bile duct consistent with the common bile  duct calculus. Sphincterotomy and balloon sweeps are performed.  IMPRESSION: Common bile duct calculus with biliary dilatation  ERCP 12/15/2007: Biliary stent removed   09/22/2007: Biliary sphincterotomy and biliary stenting and pancreatic stenting for prophylaxis.  EGD 02/2006: a small sliding hiatal hernia.  She had patch of thick mucosa distal esophagus, but was negative for Barrett's.  Colonoscopy 02/2006:  Normal  Past Medical History:  Diagnosis Date   Bile duct stenosis    Hyperlipidemia    Hypothyroidism    PONV (postoperative nausea and vomiting)    Sleep apnea     Past Surgical History:  Procedure Laterality Date   anal fissure repair     CHOLECYSTECTOMY     COLONOSCOPY N/A 12/02/2017   Procedure: COLONOSCOPY;  Surgeon: Rogene Houston, MD;  Location: [facility];  Service: Endoscopy;  Laterality: N/A;  1030   ERCP      X3   MYOMECTOMY     TONSILLECTOMY      Prior to Admission medications   Medication Sig Start Date End Date Taking? Authorizing Provider  buPROPion  (WELLBUTRIN XL) 150 MG 24 hr tablet TAKE 3 TABLETS BY MOUTH ONCE A DAY 05/09/21  Yes   dicyclomine (BENTYL) 10 MG capsule Take 1 capsule (10 mg total) by mouth 2 (two) times daily before a meal. Patient taking differently: Take 10 mg by mouth 2 (two) times daily as needed for spasms. 06/07/21  Yes Rehman, Mechele Dawley, MD  fluconazole (DIFLUCAN) 150 MG tablet Take 1 now and repeat 1 in 3 days Patient not taking: Reported on 12/25/2021 07/09/21   Estill Dooms, NP  levothyroxine (SYNTHROID) 150 MCG tablet TAKE 1 TABLET BY MOUTH ONCE DAILY 26/33/35  Yes   Obeticholic Acid (OCALIVA) 5 MG TABS Take 1 tablet by mouth daily. Patient taking differently: Take 5 mg by mouth at bedtime. 09/21/21  Yes Rehman, Mechele Dawley, MD  ursodiol (ACTIGALL) 500 MG tablet TAKE 1 TABLET BY MOUTH 3 TIMES DAILY 05/09/21  Yes   neomycin-polymyxin-hydrocortisone (CORTISPORIN) 3.5-10000-1 OTIC suspension Place 4 drops into the left ear 4 (four) times daily. X 7 days Patient not taking: Reported on 12/25/2021 03/17/21   Daleen Bo, MD  triamcinolone ointment (KENALOG) 0.5 % Apply 1 application topically 2 (two) times daily. Patient not taking: Reported on 12/25/2021 07/04/21   Estill Dooms, NP    Current Facility-Administered Medications  Medication Dose Route Frequency Provider Last Rate Last Admin   0.9 %  sodium chloride infusion  Intravenous Continuous Mansouraty, Telford Nab., MD 20 mL/hr at 12/26/21 0138 New Bag at 12/26/21 0138   0.9 %  sodium chloride infusion   Intravenous Continuous Barton Dubois, MD 75 mL/hr at 12/25/21 1416 New Bag at 12/25/21 1416   acetaminophen (TYLENOL) tablet 650 mg  650 mg Oral Q6H PRN Barton Dubois, MD       buPROPion (WELLBUTRIN XL) 24 hr tablet 450 mg  450 mg Oral Daily Barton Dubois, MD       heparin injection 5,000 Units  5,000 Units Subcutaneous Q8H Barton Dubois, MD       HYDROmorphone (DILAUDID) injection 1 mg  1 mg Intravenous Q3H PRN Barton Dubois, MD       levothyroxine  (SYNTHROID) tablet 150 mcg  150 mcg Oral Q0600 Barton Dubois, MD       ondansetron [facility]) injection 4 mg  4 mg Intravenous Q6H PRN Barton Dubois, MD   4 mg at 12/25/21 2005    Allergies as of 12/25/2021 - Review Complete 12/25/2021  Allergen Reaction Noted   Codeine Nausea And Vomiting     Family History  Problem Relation Age of Onset   Heart disease Other    Arthritis Other    Cancer Other    Hypertension Father    Cancer Father        stomach   Heart disease Mother    Colon cancer Brother    Suicidality Sister    Suicidality Brother    Hypertension Brother    Arthritis Brother    Hypertension Brother    Arthritis Brother    Hypertension Brother    Arthritis Brother     Social History   Socioeconomic History   Marital status: Married    Spouse name: Not on file   Number of children: Not on file   Years of education: Not on file   Highest education level: Not on file  Occupational History   Occupation: Homemaker  Tobacco Use   Smoking status: Former    Types: Cigarettes    Quit date: 2022    Years since quitting: 1.5   Smokeless tobacco: Never   Tobacco comments:    07/2019  Vaping Use   Vaping Use: Never used  Substance and Sexual Activity   Alcohol use: Yes    Comment: monthly   Drug use: No   Sexual activity: Yes    Birth control/protection: Post-menopausal  Other Topics Concern   Not on file  Social History Narrative   Not on file   Social Determinants of Health   Financial Resource Strain: Low Risk  (07/04/2021)   Overall Financial Resource Strain (CARDIA)    Difficulty of Paying Living Expenses: Not hard at all  Food Insecurity: No Food Insecurity (07/04/2021)   Hunger Vital Sign    Worried About Running Out of Food in the Last Year: Never true    Ran Out of Food in the Last Year: Never true  Transportation Needs: No Transportation Needs (07/04/2021)   PRAPARE - Hydrologist (Medical): No    Lack of Transportation  (Non-Medical): No  Physical Activity: Inactive (07/04/2021)   Exercise Vital Sign    Days of Exercise per Week: 0 days    Minutes of Exercise per Session: 0 min  Stress: No Stress Concern Present (07/04/2021)   [place]    Feeling of Stress : Not at all  Social Connections: Moderately Isolated (07/04/2021)  Social Licensed conveyancer [NHANES]    Frequency of Communication with Friends and Family: More than three times a week    Frequency of Social Gatherings with Friends and Family: Once a week    Attends Religious Services: Never    Marine scientist or Organizations: No    Attends Archivist Meetings: Never    Marital Status: Married  Human resources officer Violence: Not At Risk (07/04/2021)   Humiliation, Afraid, Rape, and Kick questionnaire    Fear of Current or Ex-Partner: No    Emotionally Abused: No    Physically Abused: No    Sexually Abused: No    Review of Systems: Gen: Denies fever, sweats or chills. No weight loss.  CV: Denies chest pain, palpitations or edema. Resp: Denies cough, shortness of breath of hemoptysis.  GI: See HPI GU : + Dark urine.  MS: Denies joint pain, muscles aches or weakness. Derm: Denies rash, itchiness, skin lesions or unhealing ulcers. Psych: Denies depression, anxiety or memory loss Heme: Denies easy bruising, bleeding. Neuro:  Denies headaches, dizziness or paresthesias. Endo:  Denies any problems with DM, thyroid or adrenal function.  Physical Exam: Vital signs in last 24 hours: Temp:  [97.6 F (36.4 C)-98.5 F (36.9 C)] 98.2 F (36.8 C) (08/03 0355) Pulse Rate:  [65-86] 68 (08/03 0355) Resp:  [18-20] 19 (08/03 0355) BP: (90-126)/(54-80) 90/54 (08/03 0355) SpO2:  [94 %-99 %] 95 % (08/03 0355) Weight:  [106.6 kg] 106.6 kg (08/02 0527)   General:  Alert 66 year old female in no acute distress Head:  Normocephalic and atraumatic. Eyes: Faint scleral  icterus. Conjunctiva pink. Ears:  Normal auditory acuity. Nose:  No deformity, discharge or lesions. Mouth:  Dentition intact. No ulcers or lesions.  Neck:  Supple. No lymphadenopathy or thyromegaly.  Lungs: Breath sounds clear throughout Heart: Regular rate rhythm, no murmurs Abdomen: BS abdomen, soft.  Nontender.  Nondistended.  Positive bowel sounds to all 4 quadrants. Rectal: Deferred. Musculoskeletal:  Symmetrical without gross deformities.  Pulses:  Normal pulses noted. Extremities:  Without clubbing or edema. Neurologic:  Alert and oriented x 4. No focal deficits.  Skin:  Intact without significant lesions or rashes.  Mild jaundice. Psych:  Alert and cooperative. Normal mood and affect.  Intake/Output from previous day: 08/02 0701 - 08/03 0700 In: 1500 [IV Piggyback:1500] Out: -  Intake/Output this shift: No intake/output data recorded.  Lab Results: Recent Labs    12/25/21 0620 12/26/21 0130  WBC 9.8 10.9*  HGB 11.7* 8.8*  HCT 35.6* 26.2*  PLT 258 203   BMET Recent Labs    12/25/21 0620 12/26/21 0130  NA 142 138  K 4.4 3.9  CL 108 109  CO2 25 24  GLUCOSE 130* 87  BUN 24* 24*  CREATININE 0.91 0.95  CALCIUM 8.6* 8.2*   LFT Recent Labs    12/25/21 0620 12/26/21 0130  PROT 7.3 5.6*  ALBUMIN 3.8 3.0*  AST 98* 55*  ALT 97* 86*  ALKPHOS 141* 107  BILITOT 6.0* 4.1*  BILIDIR 3.5*  --   IBILI 2.5*  --    PT/INR Recent Labs    12/25/21 1616  LABPROT 13.9  INR 1.1   Hepatitis Panel No results for input(s): "HEPBSAG", "HCVAB", "HEPAIGM", "HEPBIGM" in the last 72 hours.    Studies/Results: MR ABDOMEN MRCP W WO CONTAST  Result Date: 12/25/2021 CLINICAL DATA:  Evaluate for choledocholithiasis. EXAM: MRI ABDOMEN WITHOUT AND WITH CONTRAST (INCLUDING MRCP) TECHNIQUE: Multiplanar multisequence MR imaging of  the abdomen was performed both before and after the administration of intravenous contrast. Heavily T2-weighted images of the biliary and pancreatic  ducts were obtained, and three-dimensional MRCP images were rendered by post processing. CONTRAST:  71m GADAVIST GADOBUTROL 1 MMOL/ML IV SOLN COMPARISON:  CT AP from earlier today FINDINGS: Lower chest: No acute findings. Hepatobiliary: Diffuse hepatic steatosis noted. There is relative hypertrophy of the lateral segment of left hepatic lobe and caudate hepatic lobe. Heterogeneous arterial phase enhancement is identified within the liver which may reflect underlying hepatocellular disease. No suspicious enhancing liver lesions identified. The gallbladder is surgically absent. Increase caliber of the CBD measures up to 8 mm. Filling defect within the distal common bile duct measures 1.4 cm in length with a diameter of 5 mm. No significant intrahepatic bile duct dilatation. Pancreas: No mass, inflammatory changes, or other parenchymal abnormality identified. Spleen: The spleen measures 14.6 cm in length. No focal splenic abnormality. Adrenals/Urinary Tract: Normal adrenal glands. No signs of kidney mass or hydronephrosis. Stomach/Bowel: Stomach appears normal. There is no pathologic dilatation of the visualized bowel loops. Again noted is diffuse intramural fatty deposition throughout the colon. Vascular/Lymphatic: Normal caliber of the abdominal aorta. Multiple enlarged lymph nodes are identified within the upper abdomen, nonspecific in the setting of cirrhosis. Index peripancreatic node measures 1.9 cm, image 23/4. Index portacaval lymph node measures 1.8 cm, image 26/4. Index gastrohepatic ligament lymph node measures 1.3 cm, image 43/15. Other: Trace perihepatic fluid. No focal fluid collections identified Musculoskeletal: No suspicious bone lesions identified. IMPRESSION: 1. Exam positive for choledocholithiasis. Solitary stone within the distal common bile duct measures 1.4 cm in length with a diameter of 5 mm. 2. Diffuse hepatic steatosis with morphologic features of liver compatible with cirrhosis. 3.  Multiple enlarged lymph nodes within the upper abdomen, nonspecific in the setting of cirrhosis. 4. Mild splenomegaly. Electronically Signed   By: TKerby MoorsM.D.   On: 12/25/2021 09:45   MR 3D Recon At Scanner  Result Date: 12/25/2021 CLINICAL DATA:  Evaluate for choledocholithiasis. EXAM: MRI ABDOMEN WITHOUT AND WITH CONTRAST (INCLUDING MRCP) TECHNIQUE: Multiplanar multisequence MR imaging of the abdomen was performed both before and after the administration of intravenous contrast. Heavily T2-weighted images of the biliary and pancreatic ducts were obtained, and three-dimensional MRCP images were rendered by post processing. CONTRAST:  140mGADAVIST GADOBUTROL 1 MMOL/ML IV SOLN COMPARISON:  CT AP from earlier today FINDINGS: Lower chest: No acute findings. Hepatobiliary: Diffuse hepatic steatosis noted. There is relative hypertrophy of the lateral segment of left hepatic lobe and caudate hepatic lobe. Heterogeneous arterial phase enhancement is identified within the liver which may reflect underlying hepatocellular disease. No suspicious enhancing liver lesions identified. The gallbladder is surgically absent. Increase caliber of the CBD measures up to 8 mm. Filling defect within the distal common bile duct measures 1.4 cm in length with a diameter of 5 mm. No significant intrahepatic bile duct dilatation. Pancreas: No mass, inflammatory changes, or other parenchymal abnormality identified. Spleen: The spleen measures 14.6 cm in length. No focal splenic abnormality. Adrenals/Urinary Tract: Normal adrenal glands. No signs of kidney mass or hydronephrosis. Stomach/Bowel: Stomach appears normal. There is no pathologic dilatation of the visualized bowel loops. Again noted is diffuse intramural fatty deposition throughout the colon. Vascular/Lymphatic: Normal caliber of the abdominal aorta. Multiple enlarged lymph nodes are identified within the upper abdomen, nonspecific in the setting of cirrhosis. Index  peripancreatic node measures 1.9 cm, image 23/4. Index portacaval lymph node measures 1.8 cm, image 26/4. Index  gastrohepatic ligament lymph node measures 1.3 cm, image 43/15. Other: Trace perihepatic fluid. No focal fluid collections identified Musculoskeletal: No suspicious bone lesions identified. IMPRESSION: 1. Exam positive for choledocholithiasis. Solitary stone within the distal common bile duct measures 1.4 cm in length with a diameter of 5 mm. 2. Diffuse hepatic steatosis with morphologic features of liver compatible with cirrhosis. 3. Multiple enlarged lymph nodes within the upper abdomen, nonspecific in the setting of cirrhosis. 4. Mild splenomegaly. Electronically Signed   By: Kerby Moors M.D.   On: 12/25/2021 09:45   CT ABDOMEN PELVIS W CONTRAST  Result Date: 12/25/2021 CLINICAL DATA:  Chronic epigastric pain, concern for biliary pathology. EXAM: CT ABDOMEN AND PELVIS WITH CONTRAST TECHNIQUE: Multidetector CT imaging of the abdomen and pelvis was performed using the standard protocol following bolus administration of intravenous contrast. RADIATION DOSE REDUCTION: This exam was performed according to the departmental dose-optimization program which includes automated exposure control, adjustment of the mA and/or kV according to patient size and/or use of iterative reconstruction technique. CONTRAST:  162m OMNIPAQUE IOHEXOL 300 MG/ML  SOLN COMPARISON:  Ultrasound Oct 09, 2021 FINDINGS: Lower chest: No acute abnormality. Hepatobiliary: Hypodense hepatic parenchyma commonly reflects hepatic steatosis. Hepatic contour nodularity with increased size the caudate lobe likely reflects hepatic steatosis. Gallbladder surgically absent. Small volume pneumobilia on coronal image 65/5. Unchanged prominence of the extrahepatic biliary tree with the common duct measuring 10 mm in maximum diameter. Possible subtle peribiliary enhancement. Pancreas: No pancreatic ductal dilation or evidence of acute inflammation.  Spleen: Splenomegaly measuring 15.6 cm in maximum axial dimension. Adrenals/Urinary Tract: Bilateral adrenal glands appear normal. No hydronephrosis. Kidneys demonstrate symmetric enhancement and excretion of contrast material. Urinary bladder is unremarkable for degree of distension. Stomach/Bowel: No radiopaque enteric contrast material was administered. Stomach is minimally distended with ingested material and gas without focal wall thickening. No pathologic dilation of small or large bowel. The appendix and terminal ileum appear normal. Near pancolonic fatty infiltration of the colonic wall worse in the ascending and transverse colon. No evidence of acute bowel inflammation. Descending and sigmoid colonic diverticulosis. Vascular/Lymphatic: Normal caliber abdominal aorta. Enlarged/prominent gastrohepatic ligament, aortocaval hepatoduodenal and retroperitoneal lymph nodes. For reference a gastrohepatic ligament lymph node measures 11 mm in short axis on image 27/2 and a aortocaval lymph node measures 15 mm in short axis on image 37/2. Reproductive: Lobular uterine contour including a masslike excretion from the uterus measuring 8.4 cm reflecting uterine leiomyomas as seen on prior pelvic ultrasound. No suspicious adnexal mass. Other: No significant abdominopelvic free fluid. Musculoskeletal: No acute osseous abnormality. IMPRESSION: 1. Unchanged prominence of the extrahepatic biliary tree now with subtle peribiliary enhancement and a small volume pneumobilia, the prominence of the biliary tree is at least somewhat related to prior cholecystectomy, with the additional findings possibly reflecting superimposed sequela of a recently passed choledocholithiasis. However, cholangitis is a pertinent differential consideration. Suggest correlation with laboratory values and if clinically indicated further assessment by MRCP with and without contrast. 2. Cirrhotic hepatic morphology with hepatic steatosis and sequela of  portal venous hypertension. Electronically Signed   By: JDahlia BailiffM.D.   On: 12/25/2021 08:19    IMPRESSION/PLAN:  [age]year old female with primary biliary cholangitis, choledocholithiasis s/p ERCP with sphincterotomy and stone extraction 2012 with N/V, RUQ pain and elevated LFTs. MRI/MRCP identified choledocholithiasis. Afebrile. WBC 9.8 -> 10.9.  -NPO -IV fluids @ 75/cchr -Agree with Rocephin IV every 24 hours -ERCP with Dr. MRush Landmarkbenefits and risks discussed including risk with sedation,  risk of bleeding, perforation, infection and pancreatitis -Hold Heparin SQ in preparation for ERCP today, pharmacy contacted  -Zofran 29m IV Q 6 hrs as needed  2) Primary biliary cholangitis and hepatic steatosis. Query developing cirrhosis per CT and MRI.  On Ursodiol, started on O[place]iva 2 weeks ago.  3) Macrocytic anemia. Hg 11.7 -> 8/8, likely dilutional component + B12 deficiency. No overt GI bleeding.  Iron 129.  Saturation ratio 35.  Ferritin 362.  B12 207 -Recommend vitamin B12 replacement, defer to the hospitalist   CNoralyn Pick 12/26/2021, 08:01 AM

## 2021-12-26 NOTE — Consult Note (Signed)
[facility]                                                                                        Patient Identification: Patient Name: Cynthia Donaldson MRN: [id number] [place] Date: 12/25/2021  5:22 AM Today's Date: 12/26/2021 Reason for consult: bacteremia  Requesting provider: CHAMP auto consult   Principal Problem:   Choledocholithiasis Active Problems:   Primary biliary cholangitis ([place])   Nonalcoholic steatohepatitis ([facility])   Abdominal pain, epigastric   Class 1 obesity   Hypothyroidism   Depression   Antibiotics:  Ceftriaxone 8/3- Meronidazole 8/2-  Lines/Hardware:  Assessment 66 year old female with PMH HLD, hypothyroidism, Depression, Obesity/OSA, colon polyps, PBC,  sphincter of oddi dysfunction s/p ERCP with biliary sphincterotomy, biliary and pancreatic stenting for ppx 4/ 2009 followed by a repeat ERCP for stent removal 11/2007, choledocholithiasis s/p ERCP with sphincterotomy and stone extraction 01/01/2011  and primary biliary cholangitis diagnosed by liver biopsy done during laparoscopic cholecystectomy surgery in 2006 who presented to the [facility]  ED 8/2 with abdominal pain with nausea and NBNBvomiting. Found to have   # Choledocholithiasis S/p ERCP 8/3 s/p stone removal, biliary sphincterotomy extension and balloon DASE sphincteroplasty and balloon trawl.  # PBC # NASH Per GI   # E faecium bacteremia - most likely source of bacteremia is Hepatobiliary    Recommendations  Wil add Vancomycin, pharamcy to dose Continue ceftriaxone and metronidazole  Repeat 2 sets of blood cultures Fu sensi of E faecium  TTE  Monitor CMP Following   Rest of the management as per the primary team. Please call with questions or concerns.  Thank you for the consult  Rosiland Oz, MD Infectious Disease Physician [facility] [address]  [place], [place] [address] Phone: [phone]  Fax: [phone]  __________________________________________________________________________________________________________ HPI and Hospital Course: 66 year old female with PMH HLD, hypothyroidism, Depression, Obesity/OSA, colon polyps, , sphincter of oddi dysfunction s/p ERCP with biliary sphincterotomy, biliary and pancreatic stenting for ppx 4/ 2009 followed by a repeat ERCP for stent removal 11/2007, choledocholithiasis s/p ERCP with sphincterotomy and stone extraction 01/01/2011  and primary biliary cholangitis diagnosed by liver biopsy done during laparoscopic cholecystectomy surgery in 2006 who presented to the [facility]  ED 8/2 with abdominal pain with nausea and NBNBvomiting.  She has had intermittent episodes of right upper quadrant pain for last few years which has increased over the last 2 weeks for which she used to take Bentyl with mild relief.  She woke up on 8/2 at 2 AM with severe right upper quadrant pain with nausea and nonbloody vomiting.  She is on also for PBC and was recently started on Ocaliva approximately 2 weeks ago which has resulted in loose stool.  Urine color has been darker for the last 1 week.   Ex-smoker, drinks 1 or 2 glasses of wine monthly  At ED patient is afebrile, no leukocytosis Labs remarkable for ALP 141, AST 98, ALT 97, DB 3.5, TB 6  12/25/21 CT abdomen/pelvis and MRCP + for choledocholithiasis, liver cirrhosis, mild splenomegaly  8/3 status post ERCP for treatment  of choledocholithiasis with deployment of retrieval balloon  Denies any known hardware, heart valve replacements or Prosthetic joint.   ROS: all systems reviewed with pertinent positives and negatives as listed above  Past Medical History:  Diagnosis Date   Bile duct stenosis    Hyperlipidemia    Hypothyroidism    PONV (postoperative nausea and vomiting)    Sleep apnea    Past Surgical History:  Procedure Laterality Date   anal fissure  repair     CHOLECYSTECTOMY     COLONOSCOPY N/A 12/02/2017   Procedure: COLONOSCOPY;  Surgeon: Rogene Houston, MD;  Location: [facility];  Service: Endoscopy;  Laterality: N/A;  1030   ERCP      X3   MYOMECTOMY     TONSILLECTOMY     Scheduled Meds:  buPROPion  450 mg Oral Daily   cyanocobalamin  1,000 mcg Intramuscular Once   [START ON 12/27/2021] vitamin B-12  1,000 mcg Oral Daily   [START ON 11/26/2593] folic acid  1 mg Oral Daily   levothyroxine  150 mcg Oral Q0600   Continuous Infusions:  sodium chloride 75 mL/hr at 12/25/21 1416   cefTRIAXone (ROCEPHIN)  IV     folic acid 1 mg in sodium chloride 0.9 % 50 mL IVPB     metronidazole 500 mg (12/26/21 0546)   PRN Meds:.acetaminophen, HYDROmorphone (DILAUDID) injection, ondansetron (ZOFRAN) IV  Allergies  Allergen Reactions   Codeine Nausea And Vomiting   Social History   Socioeconomic History   Marital status: Married    Spouse name: Not on file   Number of children: Not on file   Years of education: Not on file   Highest education level: Not on file  Occupational History   Occupation: Homemaker  Tobacco Use   Smoking status: Former    Types: Cigarettes    Quit date: 2022    Years since quitting: 1.5   Smokeless tobacco: Never   Tobacco comments:    07/2019  Vaping Use   Vaping Use: Never used  Substance and Sexual Activity   Alcohol use: Yes    Comment: monthly   Drug use: No   Sexual activity: Yes    Birth control/protection: Post-menopausal  Other Topics Concern   Not on file  Social History Narrative   Not on file   Social Determinants of Health   Financial Resource Strain: Low Risk  (07/04/2021)   Overall Financial Resource Strain (CARDIA)    Difficulty of Paying Living Expenses: Not hard at all  Food Insecurity: No Food Insecurity (07/04/2021)   Hunger Vital Sign    Worried About Running Out of Food in the Last Year: Never true    Ran Out of Food in the Last Year: Never true  Transportation Needs:  No Transportation Needs (07/04/2021)   PRAPARE - Hydrologist (Medical): No    Lack of Transportation (Non-Medical): No  Physical Activity: Inactive (07/04/2021)   Exercise Vital Sign    Days of Exercise per Week: 0 days    Minutes of Exercise per Session: 0 min  Stress: No Stress Concern Present (07/04/2021)   [place]    Feeling of Stress : Not at all  Social Connections: Moderately Isolated (07/04/2021)   Social Connection and Isolation Panel [NHANES]    Frequency of Communication with Friends and Family: More than three times a week    Frequency of Social Gatherings with Friends and  Family: Once a week    Attends Religious Services: Never    Active Member of Clubs or Organizations: No    Attends Archivist Meetings: Never    Marital Status: Married  Human resources officer Violence: Not At Risk (07/04/2021)   Humiliation, Afraid, Rape, and Kick questionnaire    Fear of Current or Ex-Partner: No    Emotionally Abused: No    Physically Abused: No    Sexually Abused: No   Family History  Problem Relation Age of Onset   Heart disease Other    Arthritis Other    Cancer Other    Hypertension Father    Cancer Father        stomach   Heart disease Mother    Colon cancer Brother    Suicidality Sister    Suicidality Brother    Hypertension Brother    Arthritis Brother    Hypertension Brother    Arthritis Brother    Hypertension Brother    Arthritis Brother     Vitals BP 93/62 (BP Location: Right Arm)   Pulse 64   Temp 98.3 F (36.8 C) (Oral)   Resp 16   Ht '5\' 9"'$  (1.753 m)   Wt 106.6 kg   SpO2 95%   BMI 34.70 kg/m    Physical Exam Constitutional:  appears comfortable, obese     Comments:   Cardiovascular:     Rate and Rhythm: Normal rate and regular rhythm.     Heart sounds:   Pulmonary:     Effort: Pulmonary effort is normal on room air     Comments:   Abdominal:      Palpations: Abdomen is soft.     Tenderness: non distended and non tender   Musculoskeletal:        General: No swelling or tenderness.   Skin:    Comments: No rashes   Neurological:     General: grossly non focal, awake, alert and oriented   Psychiatric:        Mood and Affect: Mood normal.    Pertinent Microbiology Results for orders placed or performed during the hospital encounter of 12/25/21  Blood culture (routine x 2)     Status: None (Preliminary result)   Collection Time: 12/25/21 11:32 AM   Specimen: BLOOD  Result Value Ref Range Status   Specimen Description   Final    BLOOD LEFT ANTECUBITAL Performed at [facility], [place] [address], [place], [place] [address]    Special Requests   Final    BOTTLES DRAWN AEROBIC AND ANAEROBIC Blood Culture results may not be optimal due to an excessive volume of blood received in culture bottles Performed at [facility], [address], [place], [place] [address]    Culture PENDING  Incomplete   Report Status PENDING  Incomplete  Blood culture (routine x 2)     Status: None (Preliminary result)   Collection Time: 12/25/21 11:32 AM   Specimen: BLOOD  Result Value Ref Range Status   Specimen Description   Final    BLOOD RIGHT ANTECUBITAL Performed at [facility], [place] [address], [place], [place] [address]    Special Requests   Final    BOTTLES DRAWN AEROBIC AND ANAEROBIC Blood Culture adequate volume Performed at [facility], [address], [place], [place] [address]    Culture  Setup Time   Final    GRAM POSITIVE COCCI ANAEROBIC BOTTLE Gram Stain Report Called to,Read Back By and  Verified With: JOHNSON,NEIL'@0500'$  BY MATTHEWS,B 8.3.2023 Organism ID to follow CRITICAL RESULT CALLED TO, READ BACK BY AND VERIFIED WITH: Jule Economy [id number] 1130 MLM Performed at [facility], [place] [address] [place], [place] [address]    Culture GRAM POSITIVE COCCI  Final   Report Status PENDING  Incomplete  Blood  Culture ID Panel (Reflexed)     Status: Abnormal   Collection Time: 12/25/21 11:32 AM  Result Value Ref Range Status   Enterococcus faecalis NOT DETECTED NOT DETECTED Final   Enterococcus Faecium DETECTED (A) NOT DETECTED Final    Comment: CRITICAL RESULT CALLED TO, READ BACK BY AND VERIFIED WITH: PHARMD AUSTIN P [id number] 1130 MLM    Listeria monocytogenes NOT DETECTED NOT DETECTED Final   Staphylococcus species NOT DETECTED NOT DETECTED Final   Staphylococcus aureus (BCID) NOT DETECTED NOT DETECTED Final   Staphylococcus epidermidis NOT DETECTED NOT DETECTED Final   Staphylococcus lugdunensis NOT DETECTED NOT DETECTED Final   Streptococcus species NOT DETECTED NOT DETECTED Final   Streptococcus agalactiae NOT DETECTED NOT DETECTED Final   Streptococcus pneumoniae NOT DETECTED NOT DETECTED Final   Streptococcus pyogenes NOT DETECTED NOT DETECTED Final   A.calcoaceticus-baumannii NOT DETECTED NOT DETECTED Final   Bacteroides fragilis NOT DETECTED NOT DETECTED Final   Enterobacterales NOT DETECTED NOT DETECTED Final   Enterobacter cloacae complex NOT DETECTED NOT DETECTED Final   Escherichia coli NOT DETECTED NOT DETECTED Final   Klebsiella aerogenes NOT DETECTED NOT DETECTED Final   Klebsiella oxytoca NOT DETECTED NOT DETECTED Final   Klebsiella pneumoniae NOT DETECTED NOT DETECTED Final   Proteus species NOT DETECTED NOT DETECTED Final   Salmonella species NOT DETECTED NOT DETECTED Final   Serratia marcescens NOT DETECTED NOT DETECTED Final   Haemophilus influenzae NOT DETECTED NOT DETECTED Final   Neisseria meningitidis NOT DETECTED NOT DETECTED Final   Pseudomonas aeruginosa NOT DETECTED NOT DETECTED Final   Stenotrophomonas maltophilia NOT DETECTED NOT DETECTED Final   Candida albicans NOT DETECTED NOT DETECTED Final   Candida auris NOT DETECTED NOT DETECTED Final   Candida glabrata NOT DETECTED NOT DETECTED Final   Candida krusei NOT DETECTED NOT DETECTED Final   Candida  parapsilosis NOT DETECTED NOT DETECTED Final   Candida tropicalis NOT DETECTED NOT DETECTED Final   Cryptococcus neoformans/gattii NOT DETECTED NOT DETECTED Final   Vancomycin resistance NOT DETECTED NOT DETECTED Final    Comment: Performed at [facility], [address] [place], [place] [address]   Pertinent Lab seen by me:    Latest Ref Rng & Units 12/26/2021    1:30 AM 12/25/2021    6:20 AM 12/28/2017    6:11 AM  CBC  WBC 4.0 - 10.5 K/uL 10.9  9.8  7.5   Hemoglobin 12.0 - 15.0 g/dL 8.8  11.7  14.8   Hematocrit 36.0 - 46.0 % 26.2  35.6  45.0   Platelets 150 - 400 K/uL 203  258  248       Latest Ref Rng & Units 12/26/2021    1:30 AM 12/25/2021    6:20 AM 06/30/2012   12:18 AM  CMP  Glucose 70 - 99 mg/dL 87  130  148   BUN 8 - 23 mg/dL '24  24  20   '$ Creatinine 0.44 - 1.00 mg/dL 0.95  0.91  0.95   Sodium 135 - 145 mmol/L 138  142  142   Potassium 3.5 - 5.1 mmol/L 3.9  4.4  3.8   Chloride  98 - 111 mmol/L 109  108  105   CO2 22 - 32 mmol/L '24  25  28   '$ Calcium 8.9 - 10.3 mg/dL 8.2  8.6  9.1   Total Protein 6.5 - 8.1 g/dL 5.6  7.3  7.4   Total Bilirubin 0.3 - 1.2 mg/dL 4.1  6.0  0.3   Alkaline Phos 38 - 126 U/L 107  141  132   AST 15 - 41 U/L 55  98  24   ALT 0 - 44 U/L 86  97  45     PAST GI PROCEDURES:   EGD 12/02/2017: Normal esophagus. - Z-line irregular, 39 cm from the incisors. Biopsied. - A single gastric polyp. Biopsied. - Erosive gastropathy. - Normal cardia, gastric fundus, gastric body and pylorus. - Normal duodenal bulb and second portion of the duodenum.   Colonoscopy 12/02/2017: - Two small polyps at the splenic flexure and in the transverse colon. Biopsied. - Diverticulosis in the sigmoid colon. - External hemorrhoids - 5 year colonoscopy recall (brother with history of colon cancer) 1. Stomach, polyp(s)   Path report: - HYPERPLASTIC GASTRIC POLYP - NO HIGH GRADE DYSPLASIA OR MALIGNANCY IDENTIFIED 2. Esophagogastric junction, biopsy - SQUAMOCOLUMNAR  JUNCTION WITH CHRONIC INFLAMMATION - NO INTESTINAL METAPLASIA, DYSPLASIA OR MALIGNANCY IDENTIFIED 3. Colon, polyp(s), transverse and splenic flexure - POLYPOID FRAGMENTS OF COLONIC MUCOSA WITH LYMPHOID AGGREGATE(S) - NO HIGH GRADE DYSPLASIA OR MALIGNANCY IDENTIFIED   Liver biopsy 01/17/2018: - MILD TO MODERATE LYMPHOCYTIC CHOLANGITIS, CONSISTENT WITH HISTORY OF PRIMARY BILIARY CIRRHOSIS. - EARLY DUCTOPENIA - MODERATE STEATOSIS WITHOUT EVIDENCE OF STEATOHEPATITIS - PORTAL FIBROSIS WITH SEPTA THAT ARE ONLY FOCALLY BRIDGING (OVERALL STAGE 2 OF 4).   ERCP 01/01/2011 by Dr. Christoper Fabian at [facility]: FINDINGS: Contrast is injected into the common bile duct. There is mild  dilatation of the common bile duct and common hepatic duct. There is a  filling defect in the common bile duct consistent with the common bile  duct calculus. Sphincterotomy and balloon sweeps are performed.  IMPRESSION: Common bile duct calculus with biliary dilatation   ERCP 12/15/2007: Biliary stent removed    09/22/2007: Biliary sphincterotomy and biliary stenting and pancreatic stenting for prophylaxis.   EGD 02/2006: a small sliding hiatal hernia.  She had patch of thick mucosa distal esophagus, but was negative for Barrett's.   Colonoscopy 02/2006:  Normal  Pertinent Imagings/Other Imagings Plain films and CT images have been personally visualized and interpreted; radiology reports have been reviewed. Decision making incorporated into the Impression / Recommendations.  DG ERCP  Result Date: 12/26/2021 CLINICAL DATA:  66 year old female with a history choledocholithiasis EXAM: ERCP TECHNIQUE: Multiple spot images obtained with the fluoroscopic device and submitted for interpretation post-procedure. FLUOROSCOPY: Radiation Exposure Index (as provided by the fluoroscopic device): 35.7 mGy Kerma COMPARISON:  None FINDINGS: Limited intraoperative fluoroscopic spot images during ERCP. Initial image demonstrates endoscope  projecting over the upper abdomen with surgical changes of prior cholecystectomy. Subsequently there is placement of safety wire and infusion of contrast partially opacifying intrahepatic and extrahepatic ducts. Balloon angioplasty performed and then deployment of a retrieval balloon. IMPRESSION: Limited images during ERCP demonstrates treatment of choledocholithiasis with deployment of retrieval balloon. Please refer to the dictated operative report for full details of intraoperative findings and procedure. Electronically Signed   By: Corrie Mckusick D.O.   On: 12/26/2021 11:33   MR ABDOMEN MRCP W WO CONTAST  Result Date: 12/25/2021 CLINICAL DATA:  Evaluate for choledocholithiasis. EXAM: MRI ABDOMEN WITHOUT AND WITH  CONTRAST (INCLUDING MRCP) TECHNIQUE: Multiplanar multisequence MR imaging of the abdomen was performed both before and after the administration of intravenous contrast. Heavily T2-weighted images of the biliary and pancreatic ducts were obtained, and three-dimensional MRCP images were rendered by post processing. CONTRAST:  45m GADAVIST GADOBUTROL 1 MMOL/ML IV SOLN COMPARISON:  CT AP from earlier today FINDINGS: Lower chest: No acute findings. Hepatobiliary: Diffuse hepatic steatosis noted. There is relative hypertrophy of the lateral segment of left hepatic lobe and caudate hepatic lobe. Heterogeneous arterial phase enhancement is identified within the liver which may reflect underlying hepatocellular disease. No suspicious enhancing liver lesions identified. The gallbladder is surgically absent. Increase caliber of the CBD measures up to 8 mm. Filling defect within the distal common bile duct measures 1.4 cm in length with a diameter of 5 mm. No significant intrahepatic bile duct dilatation. Pancreas: No mass, inflammatory changes, or other parenchymal abnormality identified. Spleen: The spleen measures 14.6 cm in length. No focal splenic abnormality. Adrenals/Urinary Tract: Normal adrenal glands.  No signs of kidney mass or hydronephrosis. Stomach/Bowel: Stomach appears normal. There is no pathologic dilatation of the visualized bowel loops. Again noted is diffuse intramural fatty deposition throughout the colon. Vascular/Lymphatic: Normal caliber of the abdominal aorta. Multiple enlarged lymph nodes are identified within the upper abdomen, nonspecific in the setting of cirrhosis. Index peripancreatic node measures 1.9 cm, image 23/4. Index portacaval lymph node measures 1.8 cm, image 26/4. Index gastrohepatic ligament lymph node measures 1.3 cm, image 43/15. Other: Trace perihepatic fluid. No focal fluid collections identified Musculoskeletal: No suspicious bone lesions identified. IMPRESSION: 1. Exam positive for choledocholithiasis. Solitary stone within the distal common bile duct measures 1.4 cm in length with a diameter of 5 mm. 2. Diffuse hepatic steatosis with morphologic features of liver compatible with cirrhosis. 3. Multiple enlarged lymph nodes within the upper abdomen, nonspecific in the setting of cirrhosis. 4. Mild splenomegaly. Electronically Signed   By: TKerby MoorsM.D.   On: 12/25/2021 09:45   MR 3D Recon At Scanner  Result Date: 12/25/2021 CLINICAL DATA:  Evaluate for choledocholithiasis. EXAM: MRI ABDOMEN WITHOUT AND WITH CONTRAST (INCLUDING MRCP) TECHNIQUE: Multiplanar multisequence MR imaging of the abdomen was performed both before and after the administration of intravenous contrast. Heavily T2-weighted images of the biliary and pancreatic ducts were obtained, and three-dimensional MRCP images were rendered by post processing. CONTRAST:  117mGADAVIST GADOBUTROL 1 MMOL/ML IV SOLN COMPARISON:  CT AP from earlier today FINDINGS: Lower chest: No acute findings. Hepatobiliary: Diffuse hepatic steatosis noted. There is relative hypertrophy of the lateral segment of left hepatic lobe and caudate hepatic lobe. Heterogeneous arterial phase enhancement is identified within the liver  which may reflect underlying hepatocellular disease. No suspicious enhancing liver lesions identified. The gallbladder is surgically absent. Increase caliber of the CBD measures up to 8 mm. Filling defect within the distal common bile duct measures 1.4 cm in length with a diameter of 5 mm. No significant intrahepatic bile duct dilatation. Pancreas: No mass, inflammatory changes, or other parenchymal abnormality identified. Spleen: The spleen measures 14.6 cm in length. No focal splenic abnormality. Adrenals/Urinary Tract: Normal adrenal glands. No signs of kidney mass or hydronephrosis. Stomach/Bowel: Stomach appears normal. There is no pathologic dilatation of the visualized bowel loops. Again noted is diffuse intramural fatty deposition throughout the colon. Vascular/Lymphatic: Normal caliber of the abdominal aorta. Multiple enlarged lymph nodes are identified within the upper abdomen, nonspecific in the setting of cirrhosis. Index peripancreatic node measures 1.9 cm, image 23/4. Index portacaval  lymph node measures 1.8 cm, image 26/4. Index gastrohepatic ligament lymph node measures 1.3 cm, image 43/15. Other: Trace perihepatic fluid. No focal fluid collections identified Musculoskeletal: No suspicious bone lesions identified. IMPRESSION: 1. Exam positive for choledocholithiasis. Solitary stone within the distal common bile duct measures 1.4 cm in length with a diameter of 5 mm. 2. Diffuse hepatic steatosis with morphologic features of liver compatible with cirrhosis. 3. Multiple enlarged lymph nodes within the upper abdomen, nonspecific in the setting of cirrhosis. 4. Mild splenomegaly. Electronically Signed   By: Kerby Moors M.D.   On: 12/25/2021 09:45   CT ABDOMEN PELVIS W CONTRAST  Result Date: 12/25/2021 CLINICAL DATA:  Chronic epigastric pain, concern for biliary pathology. EXAM: CT ABDOMEN AND PELVIS WITH CONTRAST TECHNIQUE: Multidetector CT imaging of the abdomen and pelvis was performed using the  standard protocol following bolus administration of intravenous contrast. RADIATION DOSE REDUCTION: This exam was performed according to the departmental dose-optimization program which includes automated exposure control, adjustment of the mA and/or kV according to patient size and/or use of iterative reconstruction technique. CONTRAST:  176m OMNIPAQUE IOHEXOL 300 MG/ML  SOLN COMPARISON:  Ultrasound Oct 09, 2021 FINDINGS: Lower chest: No acute abnormality. Hepatobiliary: Hypodense hepatic parenchyma commonly reflects hepatic steatosis. Hepatic contour nodularity with increased size the caudate lobe likely reflects hepatic steatosis. Gallbladder surgically absent. Small volume pneumobilia on coronal image 65/5. Unchanged prominence of the extrahepatic biliary tree with the common duct measuring 10 mm in maximum diameter. Possible subtle peribiliary enhancement. Pancreas: No pancreatic ductal dilation or evidence of acute inflammation. Spleen: Splenomegaly measuring 15.6 cm in maximum axial dimension. Adrenals/Urinary Tract: Bilateral adrenal glands appear normal. No hydronephrosis. Kidneys demonstrate symmetric enhancement and excretion of contrast material. Urinary bladder is unremarkable for degree of distension. Stomach/Bowel: No radiopaque enteric contrast material was administered. Stomach is minimally distended with ingested material and gas without focal wall thickening. No pathologic dilation of small or large bowel. The appendix and terminal ileum appear normal. Near pancolonic fatty infiltration of the colonic wall worse in the ascending and transverse colon. No evidence of acute bowel inflammation. Descending and sigmoid colonic diverticulosis. Vascular/Lymphatic: Normal caliber abdominal aorta. Enlarged/prominent gastrohepatic ligament, aortocaval hepatoduodenal and retroperitoneal lymph nodes. For reference a gastrohepatic ligament lymph node measures 11 mm in short axis on image 27/2 and a aortocaval  lymph node measures 15 mm in short axis on image 37/2. Reproductive: Lobular uterine contour including a masslike excretion from the uterus measuring 8.4 cm reflecting uterine leiomyomas as seen on prior pelvic ultrasound. No suspicious adnexal mass. Other: No significant abdominopelvic free fluid. Musculoskeletal: No acute osseous abnormality. IMPRESSION: 1. Unchanged prominence of the extrahepatic biliary tree now with subtle peribiliary enhancement and a small volume pneumobilia, the prominence of the biliary tree is at least somewhat related to prior cholecystectomy, with the additional findings possibly reflecting superimposed sequela of a recently passed choledocholithiasis. However, cholangitis is a pertinent differential consideration. Suggest correlation with laboratory values and if clinically indicated further assessment by MRCP with and without contrast. 2. Cirrhotic hepatic morphology with hepatic steatosis and sequela of portal venous hypertension. Electronically Signed   By: JDahlia BailiffM.D.   On: 12/25/2021 08:19    I spent 100 minutes for this patient encounter including review of prior medical records/discussing diagnostics and treatment plan with the patient/family/coordinate care with primary/other specialits with greater than 50% of time in face to face encounter.   Electronically signed by:   SRosiland Oz MD Infectious Disease Physician CDam[place]  [facility] Pager: [phone]

## 2021-12-26 NOTE — H&P (View-Only) (Signed)
Referring Provider: Dr. Barton Dubois  Primary Care Physician:  Asencion Noble, MD Primary Gastroenterologist:  Virgina Evener   Reason for Consultation:  Choledocholithiasis   HPI: Cynthia Donaldson is a 66 y.o. 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   She presented to [facility] 12/25/2021 with N/V and RUQ pain. Labs in the ED showed a WBC count of 9.8. Hg 11.7. HCT 35.6. MCV 104.7. PLT 258. Na+ 142. K+ 4.4. BUN 24. Cr 0.91. Ca 8.6. Lipase 41. T. Bili 6.0. Direct bili 3.5. Indirect bili 2.5. Alk phos 141. AST 98. ALT 97. (Comparison LFTS 09/02/2021 T. Bili 0.6. Alk phos 154. AST 47. ALT 66). Blood cultures pending. Lactic acid 2.7 -> 1.6. INR 1.1.  Labs 12/26/2021: T. Bili 4.1. Alk phos 107. AST 55. ALT 86. WBC 10.9. Hg 8.8. HCT 26.2. Iron and B12 levels pending.   CTAP 12/25/2021 identified unchanged prominence of the extrahepatic biliary tree now with subtle peribiliary enhancement, small volume pneumobilia with possible superimposed sequela of a recently passed choledocholithiasis, cirrhotic hepatic morphology with hepatic steatosis and evidence of portal venous hypertension. Abdominal MRI/MRCP identified choledocholithiasis (solitary stone in the CBD, diffuse hepatic steatosis with features of cirrhosis, multiple enlarged lymph nodes in the upper abdomen and mild splenomegaly.   She was transferred transformed to [facility] for ERCP.   She endorses having intermittent episodes of RUQ once monthly for the  past 5 years, these episodes have increased over the past 2 weeks for which she took Bentyl with mild relief.  She awakened Wednesday, 12/25/2021 at 2 AM with severe RUQ pain with nausea and nonbloody emesis.  Infrequent heartburn.  No dysphagia.  She is on Urso 500 mg 3 tabs daily for PBC and she started [place] approximately 2 weeks ago which has resulted in looser stools.  No acholic stools but she noticed her stool was pale brown yesterday.  No rectal bleeding or melena.  Stools are dark after drinking red wine.  Her urine color has been darker for the past 7 days. She infrequently takes Excedrin Migraine for headaches.  She is not on any blood thinners.  She drinks 1 or 2 glasses of wine monthly. She has a history of sphincter of Oddi dysfunction which required a biliary sphincterotomy and stenting in 2009 and a history of choledocholithiasis s/p ERCP with sphincterotomy and stone extraction 12/2010. EGD 11/2017 by Dr. Melony Overly showed a hyperplastic gastric polyp and gastropathy.  A colonoscopy was done on the same date which identified 2 small polyps which were removed from the colon, biopsies were consistent with benign colonic mucosa.  A repeat colonoscopy in 5 years was recommended due to first-degree relative (brother) with history of colon cancer.  No history of cardiac or pulmonary disease.   PAST GI PROCEDURES:  EGD 12/02/2017: Normal esophagus. - Z-line irregular, 39 cm from the incisors. Biopsied. - A single gastric polyp. Biopsied. - Erosive gastropathy. - Normal cardia, gastric fundus, gastric body and pylorus. - Normal duodenal bulb and second portion of the duodenum.  Colonoscopy 12/02/2017: - Two small polyps at the splenic flexure and in the transverse colon.  Biopsied. - Diverticulosis in the sigmoid colon. - External hemorrhoids - 5 year colonoscopy recall (brother with history of colon cancer) 1. Stomach, polyp(s)  Path report: - HYPERPLASTIC GASTRIC POLYP - NO HIGH GRADE  DYSPLASIA OR MALIGNANCY IDENTIFIED 2. Esophagogastric junction, biopsy - SQUAMOCOLUMNAR JUNCTION WITH CHRONIC INFLAMMATION - NO INTESTINAL METAPLASIA, DYSPLASIA OR MALIGNANCY IDENTIFIED 3. Colon, polyp(s), transverse and splenic flexure - POLYPOID FRAGMENTS OF COLONIC MUCOSA WITH LYMPHOID AGGREGATE(S) - NO HIGH GRADE DYSPLASIA OR MALIGNANCY IDENTIFIED  Liver biopsy 01/17/2018: - MILD TO MODERATE LYMPHOCYTIC CHOLANGITIS, CONSISTENT WITH HISTORY OF PRIMARY BILIARY CIRRHOSIS. - EARLY DUCTOPENIA - MODERATE STEATOSIS WITHOUT EVIDENCE OF STEATOHEPATITIS - PORTAL FIBROSIS WITH SEPTA THAT ARE ONLY FOCALLY BRIDGING (OVERALL STAGE 2 OF 4).  ERCP 01/01/2011 by Dr. Christoper Fabian at [facility]: FINDINGS: Contrast is injected into the common bile duct. There is mild  dilatation of the common bile duct and common hepatic duct. There is a  filling defect in the common bile duct consistent with the common bile  duct calculus. Sphincterotomy and balloon sweeps are performed.  IMPRESSION: Common bile duct calculus with biliary dilatation  ERCP 12/15/2007: Biliary stent removed   09/22/2007: Biliary sphincterotomy and biliary stenting and pancreatic stenting for prophylaxis.  EGD 02/2006: a small sliding hiatal hernia.  She had patch of thick mucosa distal esophagus, but was negative for Barrett's.  Colonoscopy 02/2006:  Normal  Past Medical History:  Diagnosis Date   Bile duct stenosis    Hyperlipidemia    Hypothyroidism    PONV (postoperative nausea and vomiting)    Sleep apnea     Past Surgical History:  Procedure Laterality Date   anal fissure repair     CHOLECYSTECTOMY     COLONOSCOPY N/A 12/02/2017   Procedure: COLONOSCOPY;  Surgeon: Rogene Houston, MD;  Location: [facility];  Service: Endoscopy;  Laterality: N/A;  1030   ERCP      X3   MYOMECTOMY     TONSILLECTOMY      Prior to Admission medications   Medication Sig Start Date End Date Taking? Authorizing Provider  buPROPion  (WELLBUTRIN XL) 150 MG 24 hr tablet TAKE 3 TABLETS BY MOUTH ONCE A DAY 05/09/21  Yes   dicyclomine (BENTYL) 10 MG capsule Take 1 capsule (10 mg total) by mouth 2 (two) times daily before a meal. Patient taking differently: Take 10 mg by mouth 2 (two) times daily as needed for spasms. 06/07/21  Yes Rehman, Mechele Dawley, MD  fluconazole (DIFLUCAN) 150 MG tablet Take 1 now and repeat 1 in 3 days Patient not taking: Reported on 12/25/2021 07/09/21   Estill Dooms, NP  levothyroxine (SYNTHROID) 150 MCG tablet TAKE 1 TABLET BY MOUTH ONCE DAILY 26/33/35  Yes   Obeticholic Acid (OCALIVA) 5 MG TABS Take 1 tablet by mouth daily. Patient taking differently: Take 5 mg by mouth at bedtime. 09/21/21  Yes Rehman, Mechele Dawley, MD  ursodiol (ACTIGALL) 500 MG tablet TAKE 1 TABLET BY MOUTH 3 TIMES DAILY 05/09/21  Yes   neomycin-polymyxin-hydrocortisone (CORTISPORIN) 3.5-10000-1 OTIC suspension Place 4 drops into the left ear 4 (four) times daily. X 7 days Patient not taking: Reported on 12/25/2021 03/17/21   Daleen Bo, MD  triamcinolone ointment (KENALOG) 0.5 % Apply 1 application topically 2 (two) times daily. Patient not taking: Reported on 12/25/2021 07/04/21   Estill Dooms, NP    Current Facility-Administered Medications  Medication Dose Route Frequency Provider Last Rate Last Admin   0.9 %  sodium chloride infusion  Intravenous Continuous Mansouraty, Telford Nab., MD 20 mL/hr at 12/26/21 0138 New Bag at 12/26/21 0138   0.9 %  sodium chloride infusion   Intravenous Continuous Barton Dubois, MD 75 mL/hr at 12/25/21 1416 New Bag at 12/25/21 1416   acetaminophen (TYLENOL) tablet 650 mg  650 mg Oral Q6H PRN Barton Dubois, MD       buPROPion (WELLBUTRIN XL) 24 hr tablet 450 mg  450 mg Oral Daily Barton Dubois, MD       heparin injection 5,000 Units  5,000 Units Subcutaneous Q8H Barton Dubois, MD       HYDROmorphone (DILAUDID) injection 1 mg  1 mg Intravenous Q3H PRN Barton Dubois, MD       levothyroxine  (SYNTHROID) tablet 150 mcg  150 mcg Oral Q0600 Barton Dubois, MD       ondansetron [facility]) injection 4 mg  4 mg Intravenous Q6H PRN Barton Dubois, MD   4 mg at 12/25/21 2005    Allergies as of 12/25/2021 - Review Complete 12/25/2021  Allergen Reaction Noted   Codeine Nausea And Vomiting     Family History  Problem Relation Age of Onset   Heart disease Other    Arthritis Other    Cancer Other    Hypertension Father    Cancer Father        stomach   Heart disease Mother    Colon cancer Brother    Suicidality Sister    Suicidality Brother    Hypertension Brother    Arthritis Brother    Hypertension Brother    Arthritis Brother    Hypertension Brother    Arthritis Brother     Social History   Socioeconomic History   Marital status: Married    Spouse name: Not on file   Number of children: Not on file   Years of education: Not on file   Highest education level: Not on file  Occupational History   Occupation: Homemaker  Tobacco Use   Smoking status: Former    Types: Cigarettes    Quit date: 2022    Years since quitting: 1.5   Smokeless tobacco: Never   Tobacco comments:    07/2019  Vaping Use   Vaping Use: Never used  Substance and Sexual Activity   Alcohol use: Yes    Comment: monthly   Drug use: No   Sexual activity: Yes    Birth control/protection: Post-menopausal  Other Topics Concern   Not on file  Social History Narrative   Not on file   Social Determinants of Health   Financial Resource Strain: Low Risk  (07/04/2021)   Overall Financial Resource Strain (CARDIA)    Difficulty of Paying Living Expenses: Not hard at all  Food Insecurity: No Food Insecurity (07/04/2021)   Hunger Vital Sign    Worried About Running Out of Food in the Last Year: Never true    Ran Out of Food in the Last Year: Never true  Transportation Needs: No Transportation Needs (07/04/2021)   PRAPARE - Hydrologist (Medical): No    Lack of Transportation  (Non-Medical): No  Physical Activity: Inactive (07/04/2021)   Exercise Vital Sign    Days of Exercise per Week: 0 days    Minutes of Exercise per Session: 0 min  Stress: No Stress Concern Present (07/04/2021)   [place]    Feeling of Stress : Not at all  Social Connections: Moderately Isolated (07/04/2021)  Social Licensed conveyancer [NHANES]    Frequency of Communication with Friends and Family: More than three times a week    Frequency of Social Gatherings with Friends and Family: Once a week    Attends Religious Services: Never    Marine scientist or Organizations: No    Attends Archivist Meetings: Never    Marital Status: Married  Human resources officer Violence: Not At Risk (07/04/2021)   Humiliation, Afraid, Rape, and Kick questionnaire    Fear of Current or Ex-Partner: No    Emotionally Abused: No    Physically Abused: No    Sexually Abused: No    Review of Systems: Gen: Denies fever, sweats or chills. No weight loss.  CV: Denies chest pain, palpitations or edema. Resp: Denies cough, shortness of breath of hemoptysis.  GI: See HPI GU : + Dark urine.  MS: Denies joint pain, muscles aches or weakness. Derm: Denies rash, itchiness, skin lesions or unhealing ulcers. Psych: Denies depression, anxiety or memory loss Heme: Denies easy bruising, bleeding. Neuro:  Denies headaches, dizziness or paresthesias. Endo:  Denies any problems with DM, thyroid or adrenal function.  Physical Exam: Vital signs in last 24 hours: Temp:  [97.6 F (36.4 C)-98.5 F (36.9 C)] 98.2 F (36.8 C) (08/03 0355) Pulse Rate:  [65-86] 68 (08/03 0355) Resp:  [18-20] 19 (08/03 0355) BP: (90-126)/(54-80) 90/54 (08/03 0355) SpO2:  [94 %-99 %] 95 % (08/03 0355) Weight:  [106.6 kg] 106.6 kg (08/02 0527)   General:  Alert 66 year old female in no acute distress Head:  Normocephalic and atraumatic. Eyes: Faint scleral  icterus. Conjunctiva pink. Ears:  Normal auditory acuity. Nose:  No deformity, discharge or lesions. Mouth:  Dentition intact. No ulcers or lesions.  Neck:  Supple. No lymphadenopathy or thyromegaly.  Lungs: Breath sounds clear throughout Heart: Regular rate rhythm, no murmurs Abdomen: BS abdomen, soft.  Nontender.  Nondistended.  Positive bowel sounds to all 4 quadrants. Rectal: Deferred. Musculoskeletal:  Symmetrical without gross deformities.  Pulses:  Normal pulses noted. Extremities:  Without clubbing or edema. Neurologic:  Alert and oriented x 4. No focal deficits.  Skin:  Intact without significant lesions or rashes.  Mild jaundice. Psych:  Alert and cooperative. Normal mood and affect.  Intake/Output from previous day: 08/02 0701 - 08/03 0700 In: 1500 [IV Piggyback:1500] Out: -  Intake/Output this shift: No intake/output data recorded.  Lab Results: Recent Labs    12/25/21 0620 12/26/21 0130  WBC 9.8 10.9*  HGB 11.7* 8.8*  HCT 35.6* 26.2*  PLT 258 203   BMET Recent Labs    12/25/21 0620 12/26/21 0130  NA 142 138  K 4.4 3.9  CL 108 109  CO2 25 24  GLUCOSE 130* 87  BUN 24* 24*  CREATININE 0.91 0.95  CALCIUM 8.6* 8.2*   LFT Recent Labs    12/25/21 0620 12/26/21 0130  PROT 7.3 5.6*  ALBUMIN 3.8 3.0*  AST 98* 55*  ALT 97* 86*  ALKPHOS 141* 107  BILITOT 6.0* 4.1*  BILIDIR 3.5*  --   IBILI 2.5*  --    PT/INR Recent Labs    12/25/21 1616  LABPROT 13.9  INR 1.1   Hepatitis Panel No results for input(s): "HEPBSAG", "HCVAB", "HEPAIGM", "HEPBIGM" in the last 72 hours.    Studies/Results: MR ABDOMEN MRCP W WO CONTAST  Result Date: 12/25/2021 CLINICAL DATA:  Evaluate for choledocholithiasis. EXAM: MRI ABDOMEN WITHOUT AND WITH CONTRAST (INCLUDING MRCP) TECHNIQUE: Multiplanar multisequence MR imaging of  the abdomen was performed both before and after the administration of intravenous contrast. Heavily T2-weighted images of the biliary and pancreatic  ducts were obtained, and three-dimensional MRCP images were rendered by post processing. CONTRAST:  71m GADAVIST GADOBUTROL 1 MMOL/ML IV SOLN COMPARISON:  CT AP from earlier today FINDINGS: Lower chest: No acute findings. Hepatobiliary: Diffuse hepatic steatosis noted. There is relative hypertrophy of the lateral segment of left hepatic lobe and caudate hepatic lobe. Heterogeneous arterial phase enhancement is identified within the liver which may reflect underlying hepatocellular disease. No suspicious enhancing liver lesions identified. The gallbladder is surgically absent. Increase caliber of the CBD measures up to 8 mm. Filling defect within the distal common bile duct measures 1.4 cm in length with a diameter of 5 mm. No significant intrahepatic bile duct dilatation. Pancreas: No mass, inflammatory changes, or other parenchymal abnormality identified. Spleen: The spleen measures 14.6 cm in length. No focal splenic abnormality. Adrenals/Urinary Tract: Normal adrenal glands. No signs of kidney mass or hydronephrosis. Stomach/Bowel: Stomach appears normal. There is no pathologic dilatation of the visualized bowel loops. Again noted is diffuse intramural fatty deposition throughout the colon. Vascular/Lymphatic: Normal caliber of the abdominal aorta. Multiple enlarged lymph nodes are identified within the upper abdomen, nonspecific in the setting of cirrhosis. Index peripancreatic node measures 1.9 cm, image 23/4. Index portacaval lymph node measures 1.8 cm, image 26/4. Index gastrohepatic ligament lymph node measures 1.3 cm, image 43/15. Other: Trace perihepatic fluid. No focal fluid collections identified Musculoskeletal: No suspicious bone lesions identified. IMPRESSION: 1. Exam positive for choledocholithiasis. Solitary stone within the distal common bile duct measures 1.4 cm in length with a diameter of 5 mm. 2. Diffuse hepatic steatosis with morphologic features of liver compatible with cirrhosis. 3.  Multiple enlarged lymph nodes within the upper abdomen, nonspecific in the setting of cirrhosis. 4. Mild splenomegaly. Electronically Signed   By: TKerby MoorsM.D.   On: 12/25/2021 09:45   MR 3D Recon At Scanner  Result Date: 12/25/2021 CLINICAL DATA:  Evaluate for choledocholithiasis. EXAM: MRI ABDOMEN WITHOUT AND WITH CONTRAST (INCLUDING MRCP) TECHNIQUE: Multiplanar multisequence MR imaging of the abdomen was performed both before and after the administration of intravenous contrast. Heavily T2-weighted images of the biliary and pancreatic ducts were obtained, and three-dimensional MRCP images were rendered by post processing. CONTRAST:  140mGADAVIST GADOBUTROL 1 MMOL/ML IV SOLN COMPARISON:  CT AP from earlier today FINDINGS: Lower chest: No acute findings. Hepatobiliary: Diffuse hepatic steatosis noted. There is relative hypertrophy of the lateral segment of left hepatic lobe and caudate hepatic lobe. Heterogeneous arterial phase enhancement is identified within the liver which may reflect underlying hepatocellular disease. No suspicious enhancing liver lesions identified. The gallbladder is surgically absent. Increase caliber of the CBD measures up to 8 mm. Filling defect within the distal common bile duct measures 1.4 cm in length with a diameter of 5 mm. No significant intrahepatic bile duct dilatation. Pancreas: No mass, inflammatory changes, or other parenchymal abnormality identified. Spleen: The spleen measures 14.6 cm in length. No focal splenic abnormality. Adrenals/Urinary Tract: Normal adrenal glands. No signs of kidney mass or hydronephrosis. Stomach/Bowel: Stomach appears normal. There is no pathologic dilatation of the visualized bowel loops. Again noted is diffuse intramural fatty deposition throughout the colon. Vascular/Lymphatic: Normal caliber of the abdominal aorta. Multiple enlarged lymph nodes are identified within the upper abdomen, nonspecific in the setting of cirrhosis. Index  peripancreatic node measures 1.9 cm, image 23/4. Index portacaval lymph node measures 1.8 cm, image 26/4. Index  gastrohepatic ligament lymph node measures 1.3 cm, image 43/15. Other: Trace perihepatic fluid. No focal fluid collections identified Musculoskeletal: No suspicious bone lesions identified. IMPRESSION: 1. Exam positive for choledocholithiasis. Solitary stone within the distal common bile duct measures 1.4 cm in length with a diameter of 5 mm. 2. Diffuse hepatic steatosis with morphologic features of liver compatible with cirrhosis. 3. Multiple enlarged lymph nodes within the upper abdomen, nonspecific in the setting of cirrhosis. 4. Mild splenomegaly. Electronically Signed   By: Kerby Moors M.D.   On: 12/25/2021 09:45   CT ABDOMEN PELVIS W CONTRAST  Result Date: 12/25/2021 CLINICAL DATA:  Chronic epigastric pain, concern for biliary pathology. EXAM: CT ABDOMEN AND PELVIS WITH CONTRAST TECHNIQUE: Multidetector CT imaging of the abdomen and pelvis was performed using the standard protocol following bolus administration of intravenous contrast. RADIATION DOSE REDUCTION: This exam was performed according to the departmental dose-optimization program which includes automated exposure control, adjustment of the mA and/or kV according to patient size and/or use of iterative reconstruction technique. CONTRAST:  162m OMNIPAQUE IOHEXOL 300 MG/ML  SOLN COMPARISON:  Ultrasound Oct 09, 2021 FINDINGS: Lower chest: No acute abnormality. Hepatobiliary: Hypodense hepatic parenchyma commonly reflects hepatic steatosis. Hepatic contour nodularity with increased size the caudate lobe likely reflects hepatic steatosis. Gallbladder surgically absent. Small volume pneumobilia on coronal image 65/5. Unchanged prominence of the extrahepatic biliary tree with the common duct measuring 10 mm in maximum diameter. Possible subtle peribiliary enhancement. Pancreas: No pancreatic ductal dilation or evidence of acute inflammation.  Spleen: Splenomegaly measuring 15.6 cm in maximum axial dimension. Adrenals/Urinary Tract: Bilateral adrenal glands appear normal. No hydronephrosis. Kidneys demonstrate symmetric enhancement and excretion of contrast material. Urinary bladder is unremarkable for degree of distension. Stomach/Bowel: No radiopaque enteric contrast material was administered. Stomach is minimally distended with ingested material and gas without focal wall thickening. No pathologic dilation of small or large bowel. The appendix and terminal ileum appear normal. Near pancolonic fatty infiltration of the colonic wall worse in the ascending and transverse colon. No evidence of acute bowel inflammation. Descending and sigmoid colonic diverticulosis. Vascular/Lymphatic: Normal caliber abdominal aorta. Enlarged/prominent gastrohepatic ligament, aortocaval hepatoduodenal and retroperitoneal lymph nodes. For reference a gastrohepatic ligament lymph node measures 11 mm in short axis on image 27/2 and a aortocaval lymph node measures 15 mm in short axis on image 37/2. Reproductive: Lobular uterine contour including a masslike excretion from the uterus measuring 8.4 cm reflecting uterine leiomyomas as seen on prior pelvic ultrasound. No suspicious adnexal mass. Other: No significant abdominopelvic free fluid. Musculoskeletal: No acute osseous abnormality. IMPRESSION: 1. Unchanged prominence of the extrahepatic biliary tree now with subtle peribiliary enhancement and a small volume pneumobilia, the prominence of the biliary tree is at least somewhat related to prior cholecystectomy, with the additional findings possibly reflecting superimposed sequela of a recently passed choledocholithiasis. However, cholangitis is a pertinent differential consideration. Suggest correlation with laboratory values and if clinically indicated further assessment by MRCP with and without contrast. 2. Cirrhotic hepatic morphology with hepatic steatosis and sequela of  portal venous hypertension. Electronically Signed   By: JDahlia BailiffM.D.   On: 12/25/2021 08:19    IMPRESSION/PLAN:  [age]year old female with primary biliary cholangitis, choledocholithiasis s/p ERCP with sphincterotomy and stone extraction 2012 with N/V, RUQ pain and elevated LFTs. MRI/MRCP identified choledocholithiasis. Afebrile. WBC 9.8 -> 10.9.  -NPO -IV fluids @ 75/cchr -Agree with Rocephin IV every 24 hours -ERCP with Dr. MRush Landmarkbenefits and risks discussed including risk with sedation,  risk of bleeding, perforation, infection and pancreatitis -Hold Heparin SQ in preparation for ERCP today, pharmacy contacted  -Zofran 29m IV Q 6 hrs as needed  2) Primary biliary cholangitis and hepatic steatosis. Query developing cirrhosis per CT and MRI.  On Ursodiol, started on O[place]iva 2 weeks ago.  3) Macrocytic anemia. Hg 11.7 -> 8/8, likely dilutional component + B12 deficiency. No overt GI bleeding.  Iron 129.  Saturation ratio 35.  Ferritin 362.  B12 207 -Recommend vitamin B12 replacement, defer to the hospitalist   CNoralyn Pick 12/26/2021, 08:01 AM

## 2021-12-26 NOTE — Anesthesia Postprocedure Evaluation (Signed)
Anesthesia Post Note  Patient: Cynthia Donaldson  Procedure(s) Performed: ENDOSCOPIC RETROGRADE CHOLANGIOPANCREATOGRAPHY (ERCP) SPHINCTEROTOMY ESOPHAGOGASTRODUODENOSCOPY (EGD) BIOPSY REMOVAL OF STONES BILIARY DILATION     Patient location during evaluation: PACU Anesthesia Type: General Level of consciousness: awake and alert Pain management: pain level controlled Vital Signs Assessment: post-procedure vital signs reviewed and stable Respiratory status: spontaneous breathing, nonlabored ventilation and respiratory function stable Cardiovascular status: blood pressure returned to baseline and stable Postop Assessment: no apparent nausea or vomiting Anesthetic complications: no   No notable events documented.  Last Vitals:  Vitals:   12/26/21 1200 12/26/21 1226  BP: 96/64 93/62  Pulse: 66 64  Resp: 16 16  Temp: 36.7 C 36.8 C  SpO2: 93% 95%    Last Pain:  Vitals:   12/26/21 1226  TempSrc: Oral  PainSc:                  Lidia Collum

## 2021-12-26 NOTE — Anesthesia Preprocedure Evaluation (Signed)
Anesthesia Evaluation  Patient identified by MRN, date of birth, ID band Patient awake    Reviewed: Allergy & Precautions, NPO status , Patient's Chart, lab work & pertinent test results  History of Anesthesia Complications Negative for: history of anesthetic complications  Airway Mallampati: III  TM Distance: >3 FB Neck ROM: Full    Dental  (+) Dental Advisory Given, Teeth Intact, Caps   Pulmonary sleep apnea , former smoker,    Pulmonary exam normal        Cardiovascular negative cardio ROS Normal cardiovascular exam     Neuro/Psych negative neurological ROS     GI/Hepatic (+) Hepatitis - (NASH)Choledocholithiasis   Endo/Other  Hypothyroidism   Renal/GU negative Renal ROS  negative genitourinary   Musculoskeletal negative musculoskeletal ROS (+)   Abdominal   Peds  Hematology  (+) Blood dyscrasia, anemia ,   Anesthesia Other Findings   Reproductive/Obstetrics                             Anesthesia Physical Anesthesia Plan  ASA: 2  Anesthesia Plan: General   Post-op Pain Management: Minimal or no pain anticipated   Induction: Intravenous  PONV Risk Score and Plan: 3 and Ondansetron, Dexamethasone, Treatment may vary due to age or medical condition and Midazolam  Airway Management Planned: Oral ETT  Additional Equipment: None  Intra-op Plan:   Post-operative Plan: Extubation in OR  Informed Consent: I have reviewed the patients History and Physical, chart, labs and discussed the procedure including the risks, benefits and alternatives for the proposed anesthesia with the patient or authorized representative who has indicated his/her understanding and acceptance.     Dental advisory given  Plan Discussed with:   Anesthesia Plan Comments:         Anesthesia Quick Evaluation

## 2021-12-26 NOTE — Progress Notes (Addendum)
PROGRESS NOTE  Cynthia Donaldson  DJS:[id number] DOB: 11-08-1955 DOA: 12/25/2021 PCP: Asencion Noble, MD   Brief Narrative:  Patient is a 66 year old female with history of [place], obesity, depression, hypothyroidism, primary biliary cirrhosis who presented to the emergency department at [facility] with complaint of abdominal pain, vomiting.  On presentation, lab work showed elevated LFTs, elevated bilirubin.  Imaging studies were assessed to have choledocholithiasis with retained stone, dilated CBD.  Patient was transferred to [facility] for ERCP.  GI following.  Status post ERCP, removal of stone.  Hospital was also remarkable for finding of Enterococcus faecium bacteremia, ID consulted  Assessment & Plan:  Principal Problem:   Choledocholithiasis Active Problems:   Primary biliary cholangitis (HCC)   Nonalcoholic steatohepatitis (NASH)   Abdominal pain, epigastric   Class 1 obesity   Hypothyroidism   Depression   Abdomen pain/nausea/vomiting: From choledocholithiasis.  Continue supportive care, pain management, IV fluids.  These symptoms have resolved now  Choledocholithiasis: Lab work showed elevated LFT, bilirubin.  MRCP showed choledocholithiasis with solitary stone in the CBD,, dilated CBD.  lower suspicion for acute cholangitis.  Underwent ERCP, removal of stone accomplished by biliary sphincterotomy .  We  will check liver enzymes tomorrow.  Enterococcus faecium bacteremia: One of the blood culture growing.  Currently on vancomycin,ceftriaxone,flagyl.  ID consulted.  Patient not septic.But has mild leucocytosis ,had chills at home.Echo ordered  Hypothyroidism: Continue Synthyroid  Depression: Continue bupropion  History of NASH: Follows with outpatient GI.  Continue outpatient follow-up  Macrocytic anemia: Hemoglobin recently dropped from 11-8, likely from hemodilution.  No evidence of acute blood loss.  Vitamin B12 level low, will supplement with a dose of intramuscular  vitamin B12, continue oral supplementation.  Folate also found to be low,given iv supplementation. iron level normal.  History of primary biliary cirrhosis: Follow with GI as an outpatient  Obesity: BMI of 34.7        DVT prophylaxis:Lovenox     Code Status: Full Code  Family Communication: Discussed with husband at bedside  Patient status:Inpatient  Patient is from :Home  Anticipated discharge YI:FOYD  Estimated DC date:1-2 days   Consultants: GI, ID  Procedures: ERCP  Antimicrobials:  Anti-infectives (From admission, onward)    Start     Dose/Rate Route Frequency Ordered Stop   12/26/21 0615  cefTRIAXone (ROCEPHIN) 2 g in sodium chloride 0.9 % 100 mL IVPB        2 g 200 mL/hr over 30 Minutes Intravenous Every 24 hours 12/26/21 0522     12/26/21 0615  metroNIDAZOLE (FLAGYL) IVPB 500 mg        500 mg 100 mL/hr over 60 Minutes Intravenous Every 12 hours 12/26/21 0522         Subjective: Patient seen and examined at the bedside this afternoon hemodynamically stable.  Just came for ERCP.  Denies any abdomen pain, nausea or vomiting  Objective: Vitals:   12/25/21 1759 12/25/21 2043 12/26/21 0355 12/26/21 0751  BP: 104/70 105/65 (!) 90/54 (!) 90/56  Pulse: 65 66 68 64  Resp: '18 20 19 17  '$ Temp: 97.9 F (36.6 C) 98.5 F (36.9 C) 98.2 F (36.8 C) 98.1 F (36.7 C)  TempSrc: Oral Oral Oral Oral  SpO2: 99% 98% 95% 97%  Weight:      Height:        Intake/Output Summary (Last 24 hours) at 12/26/2021 0816 Last data filed at 12/26/2021 0751 Gross per 24 hour  Intake 1000 ml  Output --  Net 1000 ml   Filed Weights   12/25/21 0527  Weight: 106.6 kg    Examination:  General exam: Overall comfortable, not in distress,obese HEENT: PERRL Respiratory system:  no wheezes or crackles  Cardiovascular system: S1 & S2 heard, RRR.  Gastrointestinal system: Abdomen is nondistended, soft and nontender. Central nervous system: Alert and oriented Extremities: No edema,  no clubbing ,no cyanosis Skin: No rashes, no ulcers,no icterus     Data Reviewed: I have personally reviewed following labs and imaging studies  CBC: Recent Labs  Lab 12/25/21 0620 12/26/21 0130  WBC 9.8 10.9*  NEUTROABS 8.8*  --   HGB 11.7* 8.8*  HCT 35.6* 26.2*  MCV 104.7* 102.3*  PLT 258 240   Basic Metabolic Panel: Recent Labs  Lab 12/25/21 0620 12/26/21 0130  NA 142 138  K 4.4 3.9  CL 108 109  CO2 25 24  GLUCOSE 130* 87  BUN 24* 24*  CREATININE 0.91 0.95  CALCIUM 8.6* 8.2*  MG  --  2.1  PHOS  --  2.6     Recent Results (from the past 240 hour(s))  Blood culture (routine x 2)     Status: None (Preliminary result)   Collection Time: 12/25/21 11:32 AM   Specimen: BLOOD  Result Value Ref Range Status   Specimen Description   Final    BLOOD LEFT ANTECUBITAL Performed at [facility], [place] [address] [place], [place] [address]    Special Requests   Final    BOTTLES DRAWN AEROBIC AND ANAEROBIC Blood Culture results may not be optimal due to an excessive volume of blood received in culture bottles Performed at [facility], [address] [place], [place] [address]    Culture PENDING  Incomplete   Report Status PENDING  Incomplete  Blood culture (routine x 2)     Status: None (Preliminary result)   Collection Time: 12/25/21 11:32 AM   Specimen: Right Antecubital; Blood  Result Value Ref Range Status   Specimen Description RIGHT ANTECUBITAL  Final   Special Requests   Final    BOTTLES DRAWN AEROBIC AND ANAEROBIC Blood Culture adequate volume   Culture  Setup Time   Final    GRAM POSITIVE COCCI ANAEROBIC BOTTLE Gram Stain Report Called to,Read Back By and Verified With: JOHNSON,NEIL'@0500'$  BY MATTHEWS,B 8.3.2023 Performed at [facility], [address] [place], [place] [address]    Culture PENDING  Incomplete   Report Status PENDING  Incomplete     Radiology Studies: MR ABDOMEN MRCP W WO CONTAST  Result Date: 12/25/2021 CLINICAL DATA:  Evaluate for  choledocholithiasis. EXAM: MRI ABDOMEN WITHOUT AND WITH CONTRAST (INCLUDING MRCP) TECHNIQUE: Multiplanar multisequence MR imaging of the abdomen was performed both before and after the administration of intravenous contrast. Heavily T2-weighted images of the biliary and pancreatic ducts were obtained, and three-dimensional MRCP images were rendered by post processing. CONTRAST:  13m GADAVIST GADOBUTROL 1 MMOL/ML IV SOLN COMPARISON:  CT AP from earlier today FINDINGS: Lower chest: No acute findings. Hepatobiliary: Diffuse hepatic steatosis noted. There is relative hypertrophy of the lateral segment of left hepatic lobe and caudate hepatic lobe. Heterogeneous arterial phase enhancement is identified within the liver which may reflect underlying hepatocellular disease. No suspicious enhancing liver lesions identified. The gallbladder is surgically absent. Increase caliber of the CBD measures up to 8 mm. Filling defect within the distal common bile duct measures 1.4 cm in length with a diameter of 5 mm. No significant intrahepatic bile duct dilatation. Pancreas:  No mass, inflammatory changes, or other parenchymal abnormality identified. Spleen: The spleen measures 14.6 cm in length. No focal splenic abnormality. Adrenals/Urinary Tract: Normal adrenal glands. No signs of kidney mass or hydronephrosis. Stomach/Bowel: Stomach appears normal. There is no pathologic dilatation of the visualized bowel loops. Again noted is diffuse intramural fatty deposition throughout the colon. Vascular/Lymphatic: Normal caliber of the abdominal aorta. Multiple enlarged lymph nodes are identified within the upper abdomen, nonspecific in the setting of cirrhosis. Index peripancreatic node measures 1.9 cm, image 23/4. Index portacaval lymph node measures 1.8 cm, image 26/4. Index gastrohepatic ligament lymph node measures 1.3 cm, image 43/15. Other: Trace perihepatic fluid. No focal fluid collections identified Musculoskeletal: No suspicious  bone lesions identified. IMPRESSION: 1. Exam positive for choledocholithiasis. Solitary stone within the distal common bile duct measures 1.4 cm in length with a diameter of 5 mm. 2. Diffuse hepatic steatosis with morphologic features of liver compatible with cirrhosis. 3. Multiple enlarged lymph nodes within the upper abdomen, nonspecific in the setting of cirrhosis. 4. Mild splenomegaly. Electronically Signed   By: Kerby Moors M.D.   On: 12/25/2021 09:45   MR 3D Recon At Scanner  Result Date: 12/25/2021 CLINICAL DATA:  Evaluate for choledocholithiasis. EXAM: MRI ABDOMEN WITHOUT AND WITH CONTRAST (INCLUDING MRCP) TECHNIQUE: Multiplanar multisequence MR imaging of the abdomen was performed both before and after the administration of intravenous contrast. Heavily T2-weighted images of the biliary and pancreatic ducts were obtained, and three-dimensional MRCP images were rendered by post processing. CONTRAST:  74m GADAVIST GADOBUTROL 1 MMOL/ML IV SOLN COMPARISON:  CT AP from earlier today FINDINGS: Lower chest: No acute findings. Hepatobiliary: Diffuse hepatic steatosis noted. There is relative hypertrophy of the lateral segment of left hepatic lobe and caudate hepatic lobe. Heterogeneous arterial phase enhancement is identified within the liver which may reflect underlying hepatocellular disease. No suspicious enhancing liver lesions identified. The gallbladder is surgically absent. Increase caliber of the CBD measures up to 8 mm. Filling defect within the distal common bile duct measures 1.4 cm in length with a diameter of 5 mm. No significant intrahepatic bile duct dilatation. Pancreas: No mass, inflammatory changes, or other parenchymal abnormality identified. Spleen: The spleen measures 14.6 cm in length. No focal splenic abnormality. Adrenals/Urinary Tract: Normal adrenal glands. No signs of kidney mass or hydronephrosis. Stomach/Bowel: Stomach appears normal. There is no pathologic dilatation of the  visualized bowel loops. Again noted is diffuse intramural fatty deposition throughout the colon. Vascular/Lymphatic: Normal caliber of the abdominal aorta. Multiple enlarged lymph nodes are identified within the upper abdomen, nonspecific in the setting of cirrhosis. Index peripancreatic node measures 1.9 cm, image 23/4. Index portacaval lymph node measures 1.8 cm, image 26/4. Index gastrohepatic ligament lymph node measures 1.3 cm, image 43/15. Other: Trace perihepatic fluid. No focal fluid collections identified Musculoskeletal: No suspicious bone lesions identified. IMPRESSION: 1. Exam positive for choledocholithiasis. Solitary stone within the distal common bile duct measures 1.4 cm in length with a diameter of 5 mm. 2. Diffuse hepatic steatosis with morphologic features of liver compatible with cirrhosis. 3. Multiple enlarged lymph nodes within the upper abdomen, nonspecific in the setting of cirrhosis. 4. Mild splenomegaly. Electronically Signed   By: TKerby MoorsM.D.   On: 12/25/2021 09:45   CT ABDOMEN PELVIS W CONTRAST  Result Date: 12/25/2021 CLINICAL DATA:  Chronic epigastric pain, concern for biliary pathology. EXAM: CT ABDOMEN AND PELVIS WITH CONTRAST TECHNIQUE: Multidetector CT imaging of the abdomen and pelvis was performed using the standard protocol following bolus administration  of intravenous contrast. RADIATION DOSE REDUCTION: This exam was performed according to the departmental dose-optimization program which includes automated exposure control, adjustment of the mA and/or kV according to patient size and/or use of iterative reconstruction technique. CONTRAST:  134m OMNIPAQUE IOHEXOL 300 MG/ML  SOLN COMPARISON:  Ultrasound Oct 09, 2021 FINDINGS: Lower chest: No acute abnormality. Hepatobiliary: Hypodense hepatic parenchyma commonly reflects hepatic steatosis. Hepatic contour nodularity with increased size the caudate lobe likely reflects hepatic steatosis. Gallbladder surgically absent.  Small volume pneumobilia on coronal image 65/5. Unchanged prominence of the extrahepatic biliary tree with the common duct measuring 10 mm in maximum diameter. Possible subtle peribiliary enhancement. Pancreas: No pancreatic ductal dilation or evidence of acute inflammation. Spleen: Splenomegaly measuring 15.6 cm in maximum axial dimension. Adrenals/Urinary Tract: Bilateral adrenal glands appear normal. No hydronephrosis. Kidneys demonstrate symmetric enhancement and excretion of contrast material. Urinary bladder is unremarkable for degree of distension. Stomach/Bowel: No radiopaque enteric contrast material was administered. Stomach is minimally distended with ingested material and gas without focal wall thickening. No pathologic dilation of small or large bowel. The appendix and terminal ileum appear normal. Near pancolonic fatty infiltration of the colonic wall worse in the ascending and transverse colon. No evidence of acute bowel inflammation. Descending and sigmoid colonic diverticulosis. Vascular/Lymphatic: Normal caliber abdominal aorta. Enlarged/prominent gastrohepatic ligament, aortocaval hepatoduodenal and retroperitoneal lymph nodes. For reference a gastrohepatic ligament lymph node measures 11 mm in short axis on image 27/2 and a aortocaval lymph node measures 15 mm in short axis on image 37/2. Reproductive: Lobular uterine contour including a masslike excretion from the uterus measuring 8.4 cm reflecting uterine leiomyomas as seen on prior pelvic ultrasound. No suspicious adnexal mass. Other: No significant abdominopelvic free fluid. Musculoskeletal: No acute osseous abnormality. IMPRESSION: 1. Unchanged prominence of the extrahepatic biliary tree now with subtle peribiliary enhancement and a small volume pneumobilia, the prominence of the biliary tree is at least somewhat related to prior cholecystectomy, with the additional findings possibly reflecting superimposed sequela of a recently passed  choledocholithiasis. However, cholangitis is a pertinent differential consideration. Suggest correlation with laboratory values and if clinically indicated further assessment by MRCP with and without contrast. 2. Cirrhotic hepatic morphology with hepatic steatosis and sequela of portal venous hypertension. Electronically Signed   By: JDahlia BailiffM.D.   On: 12/25/2021 08:19    Scheduled Meds:  buPROPion  450 mg Oral Daily   levothyroxine  150 mcg Oral Q0600   Continuous Infusions:  sodium chloride 20 mL/hr at 12/26/21 0138   sodium chloride 75 mL/hr at 12/25/21 1416   cefTRIAXone (ROCEPHIN)  IV     metronidazole 500 mg (12/26/21 0546)     LOS: 1 day   AShelly Coss MD [facility] P8/07/2021, 8:16 AM

## 2021-12-26 NOTE — Interval H&P Note (Signed)
History and Physical Interval Note:  12/26/2021 9:40 AM  Cynthia Donaldson  has presented today for surgery, with the diagnosis of Choledocholithaisis.  The various methods of treatment have been discussed with the patient and family. After consideration of risks, benefits and other options for treatment, the patient has consented to  Procedure(s): ENDOSCOPIC RETROGRADE CHOLANGIOPANCREATOGRAPHY (ERCP) (N/A) as a surgical intervention.  The patient's history has been reviewed, patient examined, no change in status, stable for surgery.  I have reviewed the patient's chart and labs.  Questions were answered to the patient's satisfaction.    The risks of an ERCP were discussed at length, including but not limited to the risk of perforation, bleeding, abdominal pain, post-ERCP pancreatitis (while usually mild can be severe and even life threatening).    [employer]

## 2021-12-26 NOTE — Progress Notes (Signed)
PHARMACY - PHYSICIAN COMMUNICATION CRITICAL VALUE ALERT - BLOOD CULTURE IDENTIFICATION (BCID)  Assessment:  Cynthia Donaldson is an 66 y.o. female who presented to [facility] on 12/25/2021 with a chief complaint of abdominal pain, n/v with elevated LFTs and evidence of choledocholithiasis. PMH significant for primary biliary cholangitis. Underwent ERCP 12/26/2021- filling defect c/w stone noted. Complete removal was accomplished by biliary sphincterotomy. Patient is currently afebrile, WBC elevated to 10.9.   Patient is growing GPCs in 1/4 bottles, identified as Enterococcus faecium on BCID with no resistance mechanisms detected   Name of physician (or Provider) Contacted: Amrit Adhikari, Carlyle Basques, Collene Mares Manandahar  Current antibiotics: Ceftriaxone 2g IV Q24H AND Flagyl 500 mg IV Q12H   Changes to prescribed antibiotics recommended: CONTINUE Ceftriaxone and Flagyl pending ID consult  START Vancomycin IV  Recommendations accepted by provider- ID is aware and following   Results for orders placed or performed during the hospital encounter of 12/25/21  Blood Culture ID Panel (Reflexed) (Collected: 12/25/2021 11:32 AM)  Result Value Ref Range   Enterococcus faecalis NOT DETECTED NOT DETECTED   Enterococcus Faecium DETECTED (A) NOT DETECTED   Listeria monocytogenes NOT DETECTED NOT DETECTED   Staphylococcus species NOT DETECTED NOT DETECTED   Staphylococcus aureus (BCID) NOT DETECTED NOT DETECTED   Staphylococcus epidermidis NOT DETECTED NOT DETECTED   Staphylococcus lugdunensis NOT DETECTED NOT DETECTED   Streptococcus species NOT DETECTED NOT DETECTED   Streptococcus agalactiae NOT DETECTED NOT DETECTED   Streptococcus pneumoniae NOT DETECTED NOT DETECTED   Streptococcus pyogenes NOT DETECTED NOT DETECTED   A.calcoaceticus-baumannii NOT DETECTED NOT DETECTED   Bacteroides fragilis NOT DETECTED NOT DETECTED   Enterobacterales NOT DETECTED NOT DETECTED   Enterobacter cloacae complex NOT  DETECTED NOT DETECTED   Escherichia coli NOT DETECTED NOT DETECTED   Klebsiella aerogenes NOT DETECTED NOT DETECTED   Klebsiella oxytoca NOT DETECTED NOT DETECTED   Klebsiella pneumoniae NOT DETECTED NOT DETECTED   Proteus species NOT DETECTED NOT DETECTED   Salmonella species NOT DETECTED NOT DETECTED   Serratia marcescens NOT DETECTED NOT DETECTED   Haemophilus influenzae NOT DETECTED NOT DETECTED   Neisseria meningitidis NOT DETECTED NOT DETECTED   Pseudomonas aeruginosa NOT DETECTED NOT DETECTED   Stenotrophomonas maltophilia NOT DETECTED NOT DETECTED   Candida albicans NOT DETECTED NOT DETECTED   Candida auris NOT DETECTED NOT DETECTED   Candida glabrata NOT DETECTED NOT DETECTED   Candida krusei NOT DETECTED NOT DETECTED   Candida parapsilosis NOT DETECTED NOT DETECTED   Candida tropicalis NOT DETECTED NOT DETECTED   Cryptococcus neoformans/gattii NOT DETECTED NOT DETECTED   Vancomycin resistance NOT DETECTED NOT DETECTED    Adria Dill, PharmD PGY-2 Infectious Diseases Resident  12/26/2021 12:55 PM

## 2021-12-26 NOTE — Transfer of Care (Signed)
Immediate Anesthesia Transfer of Care Note  Patient: Cynthia Donaldson  Procedure(s) Performed: ENDOSCOPIC RETROGRADE CHOLANGIOPANCREATOGRAPHY (ERCP) SPHINCTEROTOMY ESOPHAGOGASTRODUODENOSCOPY (EGD) BIOPSY REMOVAL OF STONES BILIARY DILATION  Patient Location: PACU  Anesthesia Type:General  Level of Consciousness: awake and alert   Airway & Oxygen Therapy: Patient Spontanous Breathing and Patient connected to face mask oxygen  Post-op Assessment: Report given to RN and Post -op Vital signs reviewed and stable  Post vital signs: Reviewed and stable  Last Vitals:  Vitals Value Taken Time  BP 103/57 12/26/21 1130  Temp    Pulse 70 12/26/21 1132  Resp 17 12/26/21 1132  SpO2 92 % 12/26/21 1132  Vitals shown include unvalidated device data.  Last Pain:  Vitals:   12/26/21 0845  TempSrc:   PainSc: 0-No pain         Complications: No notable events documented.

## 2021-12-26 NOTE — Anesthesia Procedure Notes (Signed)
Procedure Name: Intubation Date/Time: 12/26/2021 10:28 AM  Performed by: Bryson Corona, CRNAPre-anesthesia Checklist: Patient identified, Emergency Drugs available, Suction available and Patient being monitored Patient Re-evaluated:Patient Re-evaluated prior to induction Oxygen Delivery Method: Circle System Utilized Preoxygenation: Pre-oxygenation with 100% oxygen Induction Type: IV induction Ventilation: Oral airway inserted - appropriate to patient size Laryngoscope Size: Mac and 3 Grade View: Grade II Tube type: Oral Tube size: 7.0 mm Number of attempts: 1 Airway Equipment and Method: Stylet and Oral airway Placement Confirmation: ETT inserted through vocal cords under direct vision, positive ETCO2 and breath sounds checked- equal and bilateral Secured at: 22 cm Tube secured with: Tape Dental Injury: Teeth and Oropharynx as per pre-operative assessment

## 2021-12-27 DIAGNOSIS — K8050 Calculus of bile duct without cholangitis or cholecystitis without obstruction: Secondary | ICD-10-CM | POA: Diagnosis not present

## 2021-12-27 LAB — COMPREHENSIVE METABOLIC PANEL
ALT: 64 U/L — ABNORMAL HIGH (ref 0–44)
AST: 30 U/L (ref 15–41)
Albumin: 2.9 g/dL — ABNORMAL LOW (ref 3.5–5.0)
Alkaline Phosphatase: 94 U/L (ref 38–126)
Anion gap: 7 (ref 5–15)
BUN: 13 mg/dL (ref 8–23)
CO2: 22 mmol/L (ref 22–32)
Calcium: 8.2 mg/dL — ABNORMAL LOW (ref 8.9–10.3)
Chloride: 112 mmol/L — ABNORMAL HIGH (ref 98–111)
Creatinine, Ser: 0.83 mg/dL (ref 0.44–1.00)
GFR, Estimated: >60 mL/min (ref >60)
Glucose, Bld: 95 mg/dL (ref 70–99)
Potassium: 4 mmol/L (ref 3.5–5.1)
Sodium: 141 mmol/L (ref 135–145)
Total Bilirubin: 1.6 mg/dL — ABNORMAL HIGH (ref 0.3–1.2)
Total Protein: 5.6 g/dL — ABNORMAL LOW (ref 6.5–8.1)

## 2021-12-27 LAB — CBC
HCT: 24.7 % — ABNORMAL LOW (ref 36.0–46.0)
Hemoglobin: 8 g/dL — ABNORMAL LOW (ref 12.0–15.0)
MCH: 34.5 pg — ABNORMAL HIGH (ref 26.0–34.0)
MCHC: 32.4 g/dL (ref 30.0–36.0)
MCV: 106.5 fL — ABNORMAL HIGH (ref 80.0–100.0)
Platelets: 175 10*3/uL (ref 150–400)
RBC: 2.32 MIL/uL — ABNORMAL LOW (ref 3.87–5.11)
RDW: 15.9 % — ABNORMAL HIGH (ref 11.5–15.5)
WBC: 8.4 10*3/uL (ref 4.0–10.5)
nRBC: 0.2 % (ref 0.0–0.2)

## 2021-12-27 LAB — SURGICAL PATHOLOGY

## 2021-12-27 MED ORDER — URSODIOL 300 MG PO CAPS
600.0000 mg | ORAL_CAPSULE | Freq: Three times a day (TID) | ORAL | Status: DC
  Administered 2021-12-27 – 2021-12-29 (×6): 600 mg via ORAL
  Filled 2021-12-27 (×8): qty 2

## 2021-12-27 MED ORDER — OBETICHOLIC ACID 5 MG PO TABS
5.0000 mg | ORAL_TABLET | Freq: Every day | ORAL | Status: DC
  Administered 2021-12-27 – 2021-12-28 (×2): 5 mg via ORAL
  Filled 2021-12-27 (×3): qty 1

## 2021-12-27 NOTE — Progress Notes (Signed)
Progress Note    ASSESSMENT AND PLAN:   Recurrent choledocholithiasis s/p extension of biliary sphincterotomy/sphincteroplasty with stone extraction 12/26/2021.  No obvious ascending cholangitis  Plan; -Advance diet. -Trend LFTs -GI will sign off for now -Follow biopsies for H. pylori as outpatient -D/W pt.  -Left msg for Dr Gala Romney on his cell phone     SUBJECTIVE   Feels much better Having breakfast.  No abdominal pain LFTs trending down    OBJECTIVE:     Vital signs in last 24 hours: Temp:  [97.8 F (36.6 C)-98.4 F (36.9 C)] 98.4 F (36.9 C) (08/04 0748) Pulse Rate:  [60-70] 64 (08/04 0748) Resp:  [16-18] 16 (08/04 0748) BP: (93-104)/(57-67) 100/60 (08/04 0748) SpO2:  [93 %-98 %] 98 % (08/04 0748)   General:   Alert, well-developed female in NAD EENT:  non icteric sclera, conjunctive pink.  Abdomen:  Soft, nondistended, nontender.  Normal bowel sounds,.       Neurologic:  Alert and  oriented x4;  grossly normal neurologically. Psych:  Pleasant, cooperative.  Normal mood and affect.   Intake/Output from previous day: 08/03 0701 - 08/04 0700 In: 3500.5 [P.O.:600; I.V.:2600; IV Piggyback:300.5] Out: -  Intake/Output this shift: Total I/O In: 240 [P.O.:240] Out: -   Lab Results: Recent Labs    12/25/21 0620 12/26/21 0130 12/27/21 0557  WBC 9.8 10.9* 8.4  HGB 11.7* 8.8* 8.0*  HCT 35.6* 26.2* 24.7*  PLT 258 203 175   BMET Recent Labs    12/25/21 0620 12/26/21 0130 12/27/21 0557  NA 142 138 141  K 4.4 3.9 4.0  CL 108 109 112*  CO2 '25 24 22  '$ GLUCOSE 130* 87 95  BUN 24* 24* 13  CREATININE 0.91 0.95 0.83  CALCIUM 8.6* 8.2* 8.2*   LFT Recent Labs    12/25/21 0620 12/26/21 0130 12/27/21 0557  PROT 7.3   < > 5.6*  ALBUMIN 3.8   < > 2.9*  AST 98*   < > 30  ALT 97*   < > 64*  ALKPHOS 141*   < > 94  BILITOT 6.0*   < > 1.6*  BILIDIR 3.5*  --   --   IBILI 2.5*  --   --    < > = values in this interval not displayed.    PT/INR Recent Labs    12/25/21 1616  LABPROT 13.9  INR 1.1   Hepatitis Panel No results for input(s): "HEPBSAG", "HCVAB", "HEPAIGM", "HEPBIGM" in the last 72 hours.  ECHOCARDIOGRAM COMPLETE  Result Date: 12/26/2021    ECHOCARDIOGRAM REPORT   Patient Name:   [facility] Javid Date of Exam: 12/26/2021 Medical Rec #:  [id number]      Height:       69.0 in Accession #:    [id number]     Weight:       235.0 lb Date of Birth:  [birth year]      BSA:          2.213 m Patient Age:    6 years       BP:           93/62 mmHg Patient Gender: F              HR:           68 bpm. Exam Location:  Inpatient Procedure: 2D Echo Indications:    Bacteremia  History:        Patient has no prior history of Echocardiogram  examinations.                 Risk Factors:Former Smoker.  Sonographer:    Johny Chess RDCS Referring Phys: [id number] Bryce  1. Left ventricular ejection fraction, by estimation, is 60 to 65%. The left ventricle has normal function. The left ventricle has no regional wall motion abnormalities. There is mild concentric left ventricular hypertrophy. Left ventricular diastolic parameters were normal.  2. Right ventricular systolic function is normal. The right ventricular size is normal. There is normal pulmonary artery systolic pressure.  3. The mitral valve is normal in structure. Mild mitral valve regurgitation. No evidence of mitral stenosis.  4. Tricuspid valve regurgitation is moderate.  5. The aortic valve is tricuspid. Aortic valve regurgitation is not visualized. Aortic valve sclerosis is present, with no evidence of aortic valve stenosis.  6. There is mild dilatation of the ascending aorta, measuring 38 mm.  7. The inferior vena cava is normal in size with greater than 50% respiratory variability, suggesting right atrial pressure of 3 mmHg. FINDINGS  Left Ventricle: Left ventricular ejection fraction, by estimation, is 60 to 65%. The left ventricle has normal function. The left  ventricle has no regional wall motion abnormalities. The left ventricular internal cavity size was normal in size. There is  mild concentric left ventricular hypertrophy. Left ventricular diastolic parameters were normal. Right Ventricle: The right ventricular size is normal. No increase in right ventricular wall thickness. Right ventricular systolic function is normal. There is normal pulmonary artery systolic pressure. The tricuspid regurgitant velocity is 2.63 m/s, and  with an assumed right atrial pressure of 5 mmHg, the estimated right ventricular systolic pressure is 33.8 mmHg. Left Atrium: Left atrial size was normal in size. Right Atrium: Right atrial size was normal in size. Pericardium: There is no evidence of pericardial effusion. Presence of epicardial fat layer. Mitral Valve: The mitral valve is normal in structure. Mild mitral valve regurgitation. No evidence of mitral valve stenosis. Tricuspid Valve: The tricuspid valve is normal in structure. Tricuspid valve regurgitation is moderate . No evidence of tricuspid stenosis. Aortic Valve: The aortic valve is tricuspid. Aortic valve regurgitation is not visualized. Aortic valve sclerosis is present, with no evidence of aortic valve stenosis. Pulmonic Valve: The pulmonic valve was normal in structure. Pulmonic valve regurgitation is trivial. No evidence of pulmonic stenosis. Aorta: The aortic root is normal in size and structure. There is mild dilatation of the ascending aorta, measuring 38 mm. Venous: The inferior vena cava is normal in size with greater than 50% respiratory variability, suggesting right atrial pressure of 3 mmHg. IAS/Shunts: No atrial level shunt detected by color flow Doppler.  LEFT VENTRICLE PLAX 2D LVIDd:         5.20 cm   Diastology LVIDs:         3.40 cm   LV e' medial:    8.05 cm/s LV PW:         1.10 cm   LV E/e' medial:  12.4 LV IVS:        1.10 cm   LV e' lateral:   9.68 cm/s LVOT diam:     1.80 cm   LV E/e' lateral: 10.3 LV SV:          69 LV SV Index:   31 LVOT Area:     2.54 cm  RIGHT VENTRICLE            IVC RV S prime:     9.79 cm/s  IVC diam: 2.40 cm TAPSE (M-mode): 2.3 cm LEFT ATRIUM             Index        RIGHT ATRIUM           Index LA diam:        3.70 cm 1.67 cm/m   RA Area:     15.40 cm LA Vol (A2C):   51.1 ml 23.10 ml/m  RA Volume:   37.70 ml  17.04 ml/m LA Vol (A4C):   55.5 ml 25.08 ml/m LA Biplane Vol: 55.5 ml 25.08 ml/m  AORTIC VALVE LVOT Vmax:   124.00 cm/s LVOT Vmean:  83.400 cm/s LVOT VTI:    0.272 m  AORTA Ao Root diam: 3.20 cm Ao Asc diam:  3.80 cm MITRAL VALVE                TRICUSPID VALVE MV Area (PHT): 2.91 cm     TR Peak grad:   27.7 mmHg MV Decel Time: 261 msec     TR Vmax:        263.00 cm/s MV E velocity: 100.00 cm/s MV A velocity: 70.00 cm/s   SHUNTS MV E/A ratio:  1.43         Systemic VTI:  0.27 m                             Systemic Diam: 1.80 cm Kardie Tobb DO Electronically signed by Berniece Salines DO Signature Date/Time: 12/26/2021/7:30:10 PM    Final    DG ERCP  Result Date: 12/26/2021 CLINICAL DATA:  66 year old female with a history choledocholithiasis EXAM: ERCP TECHNIQUE: Multiple spot images obtained with the fluoroscopic device and submitted for interpretation post-procedure. FLUOROSCOPY: Radiation Exposure Index (as provided by the fluoroscopic device): 35.7 mGy Kerma COMPARISON:  None FINDINGS: Limited intraoperative fluoroscopic spot images during ERCP. Initial image demonstrates endoscope projecting over the upper abdomen with surgical changes of prior cholecystectomy. Subsequently there is placement of safety wire and infusion of contrast partially opacifying intrahepatic and extrahepatic ducts. Balloon angioplasty performed and then deployment of a retrieval balloon. IMPRESSION: Limited images during ERCP demonstrates treatment of choledocholithiasis with deployment of retrieval balloon. Please refer to the dictated operative report for full details of intraoperative findings and  procedure. Electronically Signed   By: Corrie Mckusick D.O.   On: 12/26/2021 11:33     Principal Problem:   Choledocholithiasis Active Problems:   Primary biliary cholangitis (HCC)   Nonalcoholic steatohepatitis ([facility])   Abdominal pain, epigastric   Class 1 obesity   Hypothyroidism   Depression     LOS: 2 days     Carmell Austria, MD 12/27/2021, 9:40 AM [facility] [phone]

## 2021-12-27 NOTE — Progress Notes (Addendum)
ID Brief Note   Remains afebrile Leukocytosis has resolved  Liver enzymes are appropriately down trending post ERCP   Repeat blood cx 8/3 NG in less than 24 hrs               blood cx 8/4 NG in less than 12 hrs  TTE 8/3 with no concerns for vegetations/endocarditis or gross valvulopathy   Recommendations - Will DC ceftriaxone and metronidazole, continue Vancomycin, pharmacy to dose  - Fu repeat blood cx 8/3 for clearance. If negative by 48 hrs over the weekend, could transition to PO antibiotics like linezolid or augmentin  pending sensi of E faecium  for hepatobiliary source of bacteremia   - would treat for 10 day post first negative blood cultures   - D/w Dr Tawanna Solo   Dr Baxter Flattery covering this weekend and will monitor cultuers   Rosiland Oz, MD Infectious Disease Physician [facility] [address] [place], [place] [address] Phone: [phone]  Fax: [phone]

## 2021-12-27 NOTE — Progress Notes (Signed)
PROGRESS NOTE  Donda Friedli Sanmiguel  NUU:[id number] DOB: Mar 15, 1956 DOA: 12/25/2021 PCP: Asencion Noble, MD   Brief Narrative:  Patient is a 66 year old female with history of [place], obesity, depression, hypothyroidism, primary biliary cirrhosis who presented to the emergency department at [facility] with complaint of abdominal pain, vomiting.  On presentation, lab work showed elevated LFTs, elevated bilirubin.  Imaging studies were assessed to have choledocholithiasis with retained stone, dilated CBD.  Patient was transferred to [facility] for ERCP.   Status post ERCP, removal of stone.  Hospital was also remarkable for finding of Enterococcus faecium bacteremia, ID consulted.  Waiting for sensitivity and negative blood culture for 48 hours before consideration of discharge on oral antibiotics as per ID.  Assessment & Plan:  Principal Problem:   Choledocholithiasis Active Problems:   Primary biliary cholangitis (HCC)   Nonalcoholic steatohepatitis (NASH)   Abdominal pain, epigastric   Class 1 obesity   Hypothyroidism   Depression   Abdomen pain/nausea/vomiting: From choledocholithiasis.  Continue supportive care, pain management, IV fluids.  These symptoms have resolved now  Choledocholithiasis: Lab work showed elevated LFT, bilirubin.  MRCP showed choledocholithiasis with solitary stone in the CBD,, dilated CBD.  lower suspicion for acute cholangitis.  Underwent ERCP, removal of stone accomplished by biliary sphincterotomy .  Liver enzymes almost normalized  Enterococcus faecium bacteremia: One of the blood culture showed Enterococcus faecium. currently on vancomycin,ceftriaxone,flagyl.  ID consulted and following.  Patient not septic.But has mild leucocytosis ,had chills at home.Echo did not show any vegetations.  ID recommended to continue current antibiotics for now, follow-up on sensitivity and blood culture sent today.  Blood culture sent today should be negative for 48 hours before  consideration of discharge  Hypothyroidism: Continue Synthyroid  Depression: Continue bupropion  History of NASH: Follows with outpatient GI.  Continue outpatient follow-up  Macrocytic anemia: Hemoglobin recently dropped from 11-8, likely from hemodilution.  No evidence of acute blood loss.  Vitamin B12 level low, will supplement with a dose of intramuscular vitamin B12, continue oral supplementation.  Folate also found to be low,given iv supplementation. iron level normal.  Monitor  History of primary biliary cirrhosis: Follow with GI as an outpatient.  Takes obeticholic acid and ursodiol  Sleep apnea: Continue CPAP  Obesity: BMI of 34.7        DVT prophylaxis:Lovenox     Code Status: Full Code  Family Communication: Discussed with husband at bedside on 8/3  Patient status:Inpatient  Patient is from :Home  Anticipated discharge HK:VQQV  Estimated DC date:1-2 days.Needs ID clearance   Consultants: GI, ID  Procedures: ERCP  Antimicrobials:  Anti-infectives (From admission, onward)    Start     Dose/Rate Route Frequency Ordered Stop   12/27/21 1400  vancomycin (VANCOREADY) IVPB 1500 mg/300 mL        1,500 mg 150 mL/hr over 120 Minutes Intravenous Every 24 hours 12/26/21 1255     12/26/21 1345  vancomycin (VANCOCIN) 2,500 mg in sodium chloride 0.9 % 500 mL IVPB        2,500 mg 262.5 mL/hr over 120 Minutes Intravenous  Once 12/26/21 1252 12/26/21 1626   12/26/21 0615  cefTRIAXone (ROCEPHIN) 2 g in sodium chloride 0.9 % 100 mL IVPB        2 g 200 mL/hr over 30 Minutes Intravenous Every 24 hours 12/26/21 0522     12/26/21 0615  metroNIDAZOLE (FLAGYL) IVPB 500 mg        500 mg 100 mL/hr over 60  Minutes Intravenous Every 12 hours 12/26/21 0522         Subjective: Patient seen and examined at the bedside this morning.  Hemodynamically stable without any complaints today.  No nausea, vomiting or abdominal pain.  Objective: Vitals:   12/26/21 1755 12/26/21 2225  12/27/21 0639 12/27/21 0748  BP: 101/62 98/67 104/66 100/60  Pulse: 65 63 60 64  Resp: '17  17 16  '$ Temp: 98.4 F (36.9 C) 97.8 F (36.6 C)  98.4 F (36.9 C)  TempSrc: Oral Oral  Oral  SpO2: 96% 96% 97% 98%  Weight:      Height:        Intake/Output Summary (Last 24 hours) at 12/27/2021 1304 Last data filed at 12/27/2021 7591 Gross per 24 hour  Intake 3040.47 ml  Output --  Net 3040.47 ml   Filed Weights   12/25/21 0527  Weight: 106.6 kg    Examination:  General exam: Overall comfortable, not in distress,obese HEENT: PERRL Respiratory system:  no wheezes or crackles  Cardiovascular system: S1 & S2 heard, RRR.  Gastrointestinal system: Abdomen is nondistended, soft and nontender. Central nervous system: Alert and oriented Extremities: No edema, no clubbing ,no cyanosis Skin: No rashes, no ulcers,no icterus    Data Reviewed: I have personally reviewed following labs and imaging studies  CBC: Recent Labs  Lab 12/25/21 0620 12/26/21 0130 12/27/21 0557  WBC 9.8 10.9* 8.4  NEUTROABS 8.8*  --   --   HGB 11.7* 8.8* 8.0*  HCT 35.6* 26.2* 24.7*  MCV 104.7* 102.3* 106.5*  PLT 258 203 638   Basic Metabolic Panel: Recent Labs  Lab 12/25/21 0620 12/26/21 0130 12/27/21 0557  NA 142 138 141  K 4.4 3.9 4.0  CL 108 109 112*  CO2 '25 24 22  '$ GLUCOSE 130* 87 95  BUN 24* 24* 13  CREATININE 0.91 0.95 0.83  CALCIUM 8.6* 8.2* 8.2*  MG  --  2.1  --   PHOS  --  2.6  --      Recent Results (from the past 240 hour(s))  Blood culture (routine x 2)     Status: None (Preliminary result)   Collection Time: 12/25/21 11:32 AM   Specimen: BLOOD  Result Value Ref Range Status   Specimen Description   Final    BLOOD LEFT ANTECUBITAL Performed at [facility], [address] [place], [place] [address]    Special Requests   Final    BOTTLES DRAWN AEROBIC AND ANAEROBIC Blood Culture results may not be optimal due to an excessive volume of blood received in culture bottles    Culture   Final    NO GROWTH 2 DAYS Performed at [facility], [address] [place], [place] [address]    Report Status PENDING  Incomplete  Blood culture (routine x 2)     Status: Abnormal (Preliminary result)   Collection Time: 12/25/21 11:32 AM   Specimen: BLOOD  Result Value Ref Range Status   Specimen Description   Final    BLOOD RIGHT ANTECUBITAL Performed at [facility], [place] [address] [place], [place] [address]    Special Requests   Final    BOTTLES DRAWN AEROBIC AND ANAEROBIC Blood Culture adequate volume Performed at [facility], [address], [place], [place] [address]    Culture  Setup Time   Final    GRAM POSITIVE COCCI ANAEROBIC BOTTLE Gram Stain Report Called to,Read Back By and Verified With: JOHNSON,NEIL'@0500'$  BY MATTHEWS,B 8.3.2023 Organism  ID to follow CRITICAL RESULT CALLED TO, READ BACK BY AND VERIFIED WITH: PHARMD AUSTIN P [id number] 1130 MLM    Culture (A)  Final    ENTEROCOCCUS FAECIUM SUSCEPTIBILITIES TO FOLLOW Performed at [facility], [place] [address] [place], [place] [address]    Report Status PENDING  Incomplete  Blood Culture ID Panel (Reflexed)     Status: Abnormal   Collection Time: 12/25/21 11:32 AM  Result Value Ref Range Status   Enterococcus faecalis NOT DETECTED NOT DETECTED Final   Enterococcus Faecium DETECTED (A) NOT DETECTED Final    Comment: CRITICAL RESULT CALLED TO, READ BACK BY AND VERIFIED WITH: PHARMD AUSTIN P [id number] 1130 MLM    Listeria monocytogenes NOT DETECTED NOT DETECTED Final   Staphylococcus species NOT DETECTED NOT DETECTED Final   Staphylococcus aureus (BCID) NOT DETECTED NOT DETECTED Final   Staphylococcus epidermidis NOT DETECTED NOT DETECTED Final   Staphylococcus lugdunensis NOT DETECTED NOT DETECTED Final   Streptococcus species NOT DETECTED NOT DETECTED Final   Streptococcus agalactiae NOT DETECTED NOT DETECTED Final   Streptococcus pneumoniae NOT DETECTED NOT DETECTED Final   Streptococcus  pyogenes NOT DETECTED NOT DETECTED Final   A.calcoaceticus-baumannii NOT DETECTED NOT DETECTED Final   Bacteroides fragilis NOT DETECTED NOT DETECTED Final   Enterobacterales NOT DETECTED NOT DETECTED Final   Enterobacter cloacae complex NOT DETECTED NOT DETECTED Final   Escherichia coli NOT DETECTED NOT DETECTED Final   Klebsiella aerogenes NOT DETECTED NOT DETECTED Final   Klebsiella oxytoca NOT DETECTED NOT DETECTED Final   Klebsiella pneumoniae NOT DETECTED NOT DETECTED Final   Proteus species NOT DETECTED NOT DETECTED Final   Salmonella species NOT DETECTED NOT DETECTED Final   Serratia marcescens NOT DETECTED NOT DETECTED Final   Haemophilus influenzae NOT DETECTED NOT DETECTED Final   Neisseria meningitidis NOT DETECTED NOT DETECTED Final   Pseudomonas aeruginosa NOT DETECTED NOT DETECTED Final   Stenotrophomonas maltophilia NOT DETECTED NOT DETECTED Final   Candida albicans NOT DETECTED NOT DETECTED Final   Candida auris NOT DETECTED NOT DETECTED Final   Candida glabrata NOT DETECTED NOT DETECTED Final   Candida krusei NOT DETECTED NOT DETECTED Final   Candida parapsilosis NOT DETECTED NOT DETECTED Final   Candida tropicalis NOT DETECTED NOT DETECTED Final   Cryptococcus neoformans/gattii NOT DETECTED NOT DETECTED Final   Vancomycin resistance NOT DETECTED NOT DETECTED Final    Comment: Performed at [facility], [place] [address], [place], [place] [address]  Culture, blood (Routine X 2) w Reflex to ID Panel     Status: None (Preliminary result)   Collection Time: 12/26/21  2:11 PM   Specimen: BLOOD LEFT HAND  Result Value Ref Range Status   Specimen Description BLOOD LEFT HAND  Final   Special Requests   Final    BOTTLES DRAWN AEROBIC AND ANAEROBIC Blood Culture adequate volume   Culture   Final    NO GROWTH < 24 HOURS Performed at [facility], [place] [address], [place], [place] [address]    Report Status PENDING  Incomplete  Culture, blood (Routine X 2) w Reflex  to ID Panel     Status: None (Preliminary result)   Collection Time: 12/26/21  2:14 PM   Specimen: BLOOD  Result Value Ref Range Status   Specimen Description BLOOD RIGHT ANTECUBITAL  Final   Special Requests   Final    BOTTLES DRAWN AEROBIC AND ANAEROBIC Blood Culture adequate volume   Culture   Final  NO GROWTH < 24 HOURS Performed at [place] [address] [place], [place] [address]    Report Status PENDING  Incomplete  Culture, blood (Routine X 2) w Reflex to ID Panel     Status: None (Preliminary result)   Collection Time: 12/27/21  5:57 AM   Specimen: BLOOD RIGHT HAND  Result Value Ref Range Status   Specimen Description BLOOD RIGHT HAND  Final   Special Requests   Final    BOTTLES DRAWN AEROBIC AND ANAEROBIC Blood Culture adequate volume   Culture   Final    NO GROWTH < 12 HOURS Performed at [facility], [place] [address] [place], [place] [address]    Report Status PENDING  Incomplete  Culture, blood (Routine X 2) w Reflex to ID Panel     Status: None (Preliminary result)   Collection Time: 12/27/21  6:10 AM   Specimen: BLOOD  Result Value Ref Range Status   Specimen Description BLOOD LEFT ANTECUBITAL  Final   Special Requests   Final    BOTTLES DRAWN AEROBIC AND ANAEROBIC Blood Culture results may not be optimal due to an inadequate volume of blood received in culture bottles   Culture   Final    NO GROWTH < 12 HOURS Performed at [facility], [place] [address], [place], [place] [address]    Report Status PENDING  Incomplete     Radiology Studies: ECHOCARDIOGRAM COMPLETE  Result Date: 12/26/2021    ECHOCARDIOGRAM REPORT   Patient Name:   JESICCA ANN Kelliher Date of Exam: 12/26/2021 Medical Rec #:  [id number]      Height:       69.0 in Accession #:    [id number]     Weight:       235.0 lb Date of Birth:  01-May-1956      BSA:          2.213 m Patient Age:    78 years       BP:           93/62 mmHg Patient Gender: F              HR:           68 bpm. Exam  Location:  Inpatient Procedure: 2D Echo Indications:    Bacteremia  History:        Patient has no prior history of Echocardiogram examinations.                 Risk Factors:Former Smoker.  Sonographer:    Johny Chess RDCS Referring Phys: [id number] [place]  1. Left ventricular ejection fraction, by estimation, is 60 to 65%. The left ventricle has normal function. The left ventricle has no regional wall motion abnormalities. There is mild concentric left ventricular hypertrophy. Left ventricular diastolic parameters were normal.  2. Right ventricular systolic function is normal. The right ventricular size is normal. There is normal pulmonary artery systolic pressure.  3. The mitral valve is normal in structure. Mild mitral valve regurgitation. No evidence of mitral stenosis.  4. Tricuspid valve regurgitation is moderate.  5. The aortic valve is tricuspid. Aortic valve regurgitation is not visualized. Aortic valve sclerosis is present, with no evidence of aortic valve stenosis.  6. There is mild dilatation of the ascending aorta, measuring 38 mm.  7. The inferior vena cava is normal in size with greater than 50% respiratory variability, suggesting right atrial pressure of 3 mmHg. FINDINGS  Left  Ventricle: Left ventricular ejection fraction, by estimation, is 60 to 65%. The left ventricle has normal function. The left ventricle has no regional wall motion abnormalities. The left ventricular internal cavity size was normal in size. There is  mild concentric left ventricular hypertrophy. Left ventricular diastolic parameters were normal. Right Ventricle: The right ventricular size is normal. No increase in right ventricular wall thickness. Right ventricular systolic function is normal. There is normal pulmonary artery systolic pressure. The tricuspid regurgitant velocity is 2.63 m/s, and  with an assumed right atrial pressure of 5 mmHg, the estimated right ventricular systolic pressure is 62.9  mmHg. Left Atrium: Left atrial size was normal in size. Right Atrium: Right atrial size was normal in size. Pericardium: There is no evidence of pericardial effusion. Presence of epicardial fat layer. Mitral Valve: The mitral valve is normal in structure. Mild mitral valve regurgitation. No evidence of mitral valve stenosis. Tricuspid Valve: The tricuspid valve is normal in structure. Tricuspid valve regurgitation is moderate . No evidence of tricuspid stenosis. Aortic Valve: The aortic valve is tricuspid. Aortic valve regurgitation is not visualized. Aortic valve sclerosis is present, with no evidence of aortic valve stenosis. Pulmonic Valve: The pulmonic valve was normal in structure. Pulmonic valve regurgitation is trivial. No evidence of pulmonic stenosis. Aorta: The aortic root is normal in size and structure. There is mild dilatation of the ascending aorta, measuring 38 mm. Venous: The inferior vena cava is normal in size with greater than 50% respiratory variability, suggesting right atrial pressure of 3 mmHg. IAS/Shunts: No atrial level shunt detected by color flow Doppler.  LEFT VENTRICLE PLAX 2D LVIDd:         5.20 cm   Diastology LVIDs:         3.40 cm   LV e' medial:    8.05 cm/s LV PW:         1.10 cm   LV E/e' medial:  12.4 LV IVS:        1.10 cm   LV e' lateral:   9.68 cm/s LVOT diam:     1.80 cm   LV E/e' lateral: 10.3 LV SV:         69 LV SV Index:   31 LVOT Area:     2.54 cm  RIGHT VENTRICLE            IVC RV S prime:     9.79 cm/s  IVC diam: 2.40 cm TAPSE (M-mode): 2.3 cm LEFT ATRIUM             Index        RIGHT ATRIUM           Index LA diam:        3.70 cm 1.67 cm/m   RA Area:     15.40 cm LA Vol (A2C):   51.1 ml 23.10 ml/m  RA Volume:   37.70 ml  17.04 ml/m LA Vol (A4C):   55.5 ml 25.08 ml/m LA Biplane Vol: 55.5 ml 25.08 ml/m  AORTIC VALVE LVOT Vmax:   124.00 cm/s LVOT Vmean:  83.400 cm/s LVOT VTI:    0.272 m  AORTA Ao Root diam: 3.20 cm Ao Asc diam:  3.80 cm MITRAL VALVE                 TRICUSPID VALVE MV Area (PHT): 2.91 cm     TR Peak grad:   27.7 mmHg MV Decel Time: 261 msec     TR Vmax:  263.00 cm/s MV E velocity: 100.00 cm/s MV A velocity: 70.00 cm/s   SHUNTS MV E/A ratio:  1.43         Systemic VTI:  0.27 m                             Systemic Diam: 1.80 cm Godfrey Pick Tobb DO Electronically signed by Berniece Salines DO Signature Date/Time: 12/26/2021/7:30:10 PM    Final    DG ERCP  Result Date: 12/26/2021 CLINICAL DATA:  66 year old female with a history choledocholithiasis EXAM: ERCP TECHNIQUE: Multiple spot images obtained with the fluoroscopic device and submitted for interpretation post-procedure. FLUOROSCOPY: Radiation Exposure Index (as provided by the fluoroscopic device): 35.7 mGy Kerma COMPARISON:  None FINDINGS: Limited intraoperative fluoroscopic spot images during ERCP. Initial image demonstrates endoscope projecting over the upper abdomen with surgical changes of prior cholecystectomy. Subsequently there is placement of safety wire and infusion of contrast partially opacifying intrahepatic and extrahepatic ducts. Balloon angioplasty performed and then deployment of a retrieval balloon. IMPRESSION: Limited images during ERCP demonstrates treatment of choledocholithiasis with deployment of retrieval balloon. Please refer to the dictated operative report for full details of intraoperative findings and procedure. Electronically Signed   By: Corrie Mckusick D.O.   On: 12/26/2021 11:33    Scheduled Meds:  buPROPion  450 mg Oral Daily   vitamin B-12  1,000 mcg Oral Daily   enoxaparin (LOVENOX) injection  50 mg Subcutaneous Daily   folic acid  1 mg Oral Daily   levothyroxine  150 mcg Oral [id number]   Obeticholic Acid  5 mg Oral Daily   ursodiol  500 mg Oral TID   Continuous Infusions:  cefTRIAXone (ROCEPHIN)  IV 2 g (12/27/21 7628)   metronidazole 500 mg (12/27/21 1009)   vancomycin       LOS: 2 days   Shelly Coss, MD [facility] P8/08/2021, 1:04 PM

## 2021-12-28 ENCOUNTER — Other Ambulatory Visit (HOSPITAL_COMMUNITY): Payer: Self-pay

## 2021-12-28 ENCOUNTER — Encounter (HOSPITAL_COMMUNITY): Payer: Self-pay | Admitting: Gastroenterology

## 2021-12-28 DIAGNOSIS — F32A Depression, unspecified: Secondary | ICD-10-CM | POA: Diagnosis not present

## 2021-12-28 DIAGNOSIS — R1013 Epigastric pain: Secondary | ICD-10-CM | POA: Diagnosis not present

## 2021-12-28 DIAGNOSIS — E669 Obesity, unspecified: Secondary | ICD-10-CM | POA: Diagnosis not present

## 2021-12-28 DIAGNOSIS — K8050 Calculus of bile duct without cholangitis or cholecystitis without obstruction: Secondary | ICD-10-CM | POA: Diagnosis not present

## 2021-12-28 LAB — COMPREHENSIVE METABOLIC PANEL
ALT: 58 U/L — ABNORMAL HIGH (ref 0–44)
AST: 30 U/L (ref 15–41)
Albumin: 2.9 g/dL — ABNORMAL LOW (ref 3.5–5.0)
Alkaline Phosphatase: 98 U/L (ref 38–126)
Anion gap: 5 (ref 5–15)
BUN: 13 mg/dL (ref 8–23)
CO2: 25 mmol/L (ref 22–32)
Calcium: 8.1 mg/dL — ABNORMAL LOW (ref 8.9–10.3)
Chloride: 110 mmol/L (ref 98–111)
Creatinine, Ser: 0.98 mg/dL (ref 0.44–1.00)
GFR, Estimated: >60 mL/min (ref >60)
Glucose, Bld: 97 mg/dL (ref 70–99)
Potassium: 3.7 mmol/L (ref 3.5–5.1)
Sodium: 140 mmol/L (ref 135–145)
Total Bilirubin: 1.7 mg/dL — ABNORMAL HIGH (ref 0.3–1.2)
Total Protein: 5.6 g/dL — ABNORMAL LOW (ref 6.5–8.1)

## 2021-12-28 LAB — CULTURE, BLOOD (ROUTINE X 2): Special Requests: ADEQUATE

## 2021-12-28 LAB — CBC
HCT: 24.7 % — ABNORMAL LOW (ref 36.0–46.0)
Hemoglobin: 8.3 g/dL — ABNORMAL LOW (ref 12.0–15.0)
MCH: 35.5 pg — ABNORMAL HIGH (ref 26.0–34.0)
MCHC: 33.6 g/dL (ref 30.0–36.0)
MCV: 105.6 fL — ABNORMAL HIGH (ref 80.0–100.0)
Platelets: 153 10*3/uL (ref 150–400)
RBC: 2.34 MIL/uL — ABNORMAL LOW (ref 3.87–5.11)
RDW: 16.4 % — ABNORMAL HIGH (ref 11.5–15.5)
WBC: 6.5 10*3/uL (ref 4.0–10.5)
nRBC: 0.5 % — ABNORMAL HIGH (ref 0.0–0.2)

## 2021-12-28 NOTE — Progress Notes (Signed)
ID PROGRESS NOTE  66yo F with choledocolithiasis s/p ERCP with secondary enterococcal bacteremia. (E.faecium - possibly amp sensitive since Vanco Resistance gene not identified)  - continue on vancomycin for the time being - recommend to stay overnight, until 48hrs from last blood cx to ensure she is clearing bacteremia - tomorrow, should have sensitivities where we can decide toswitch to either linezolid '600mg'$  po bid or high dose amoxicillin 1gm TID to complete 14 day course of therapy  Kaesyn Johnston B. [place] [facility] [phone]

## 2021-12-28 NOTE — Progress Notes (Signed)
PROGRESS NOTE    Cynthia Donaldson  KHT:[id number] DOB: August 31, 1955 DOA: 12/25/2021 PCP: Asencion Noble, MD   Brief Narrative:  Patient is a 66 year old female with history of [place], obesity, depression, hypothyroidism, primary biliary cirrhosis who presented to the emergency department at [facility] with complaint of abdominal pain, vomiting.  On presentation, lab work showed elevated LFTs, elevated bilirubin.  Imaging studies were assessed to have choledocholithiasis with retained stone, dilated CBD.  Patient was transferred to [facility] for ERCP.   Status post ERCP, removal of stone.  Hospital was also remarkable for finding of Enterococcus faecium bacteremia, ID consulted.  Waiting for sensitivity and negative blood culture for 48 hours before consideration of discharge on oral antibiotics as per ID.Marland Kitchenanticipate d/c on 8/6   Assessment & Plan:   Principal Problem:   Choledocholithiasis Active Problems:   Primary biliary cholangitis (HCC)   Nonalcoholic steatohepatitis (NASH)   Abdominal pain, epigastric   Class 1 obesity   Hypothyroidism   Depression   Abdomen pain/nausea/vomiting: From choledocholithiasis.  Continue supportive care, pain management, IV fluids.  These symptoms have resolved now   Choledocholithiasis: Lab work showed elevated LFT, bilirubin.  MRCP showed choledocholithiasis with solitary stone in the CBD,, dilated CBD.  lower suspicion for acute cholangitis.  Underwent ERCP, removal of stone accomplished by biliary sphincterotomy .  Liver enzymes almost normalized   Enterococcus faecium bacteremia: One of the blood culture showed Enterococcus faecium. currently on vancomycin,ceftriaxone,flagyl.  ID consulted and following.  Patient not septic.But has mild leucocytosis ,had chills at home.Echo did not show any vegetations.  ID recommended to continue current antibiotics for now, follow-up on sensitivity and blood culture sent today.  Blood culture sent  should be negative  for 48 hours before consideration of discharge. Reviewed ID note, anticipate d/c 8/6   Hypothyroidism: Continue Synthyroid   Depression: Continue bupropion   History of NASH: Follows with outpatient GI.  Continue outpatient follow-up   Macrocytic anemia: Hemoglobin recently dropped from 11-8, likely from hemodilution.  No evidence of acute blood loss.  Vitamin B12 level low, will supplement with a dose of intramuscular vitamin B12, continue oral supplementation.  Folate also found to be low,given iv supplementation. iron level normal.  Monitor   History of primary biliary cirrhosis: Follow with GI as an outpatient.  Takes obeticholic acid and ursodiol   Sleep apnea: Continue CPAP   Obesity: BMI of 34.7  DVT prophylaxis: Lovenox SQ  Code Status: full    Code Status Orders  (From admission, onward)           Start     Ordered   12/25/21 1852  Full code  Continuous        12/25/21 1852           Code Status History     Date Active Date Inactive Code Status Order ID Comments User Context   12/27/2010 0823 12/27/2010 1957 Full Code [id number]  Asencion Noble, MD ED      Family Communication: discussed with patient in detail, ALSO DISCUSSED WITH HUSBAND BY PHONE Disposition Plan:   planned for d/c in AM Consults called: None Admission status: Inpatient   Consultants:  ID, GI  Procedures:  ECHOCARDIOGRAM COMPLETE  Result Date: 12/26/2021    ECHOCARDIOGRAM REPORT   Patient Name:   Cynthia Donaldson Date of Exam: 12/26/2021 Medical Rec #:  [id number]      Height:       69.0 in Accession #:  [id number]     Weight:       235.0 lb Date of Birth:  May 29, 1955      BSA:          2.213 m Patient Age:    34 years       BP:           93/62 mmHg Patient Gender: F              HR:           68 bpm. Exam Location:  Inpatient Procedure: 2D Echo Indications:    Bacteremia  History:        Patient has no prior history of Echocardiogram examinations.                 Risk Factors:Former Smoker.   Sonographer:    Johny Chess RDCS Referring Phys: [id number] [place]  1. Left ventricular ejection fraction, by estimation, is 60 to 65%. The left ventricle has normal function. The left ventricle has no regional wall motion abnormalities. There is mild concentric left ventricular hypertrophy. Left ventricular diastolic parameters were normal.  2. Right ventricular systolic function is normal. The right ventricular size is normal. There is normal pulmonary artery systolic pressure.  3. The mitral valve is normal in structure. Mild mitral valve regurgitation. No evidence of mitral stenosis.  4. Tricuspid valve regurgitation is moderate.  5. The aortic valve is tricuspid. Aortic valve regurgitation is not visualized. Aortic valve sclerosis is present, with no evidence of aortic valve stenosis.  6. There is mild dilatation of the ascending aorta, measuring 38 mm.  7. The inferior vena cava is normal in size with greater than 50% respiratory variability, suggesting right atrial pressure of 3 mmHg. FINDINGS  Left Ventricle: Left ventricular ejection fraction, by estimation, is 60 to 65%. The left ventricle has normal function. The left ventricle has no regional wall motion abnormalities. The left ventricular internal cavity size was normal in size. There is  mild concentric left ventricular hypertrophy. Left ventricular diastolic parameters were normal. Right Ventricle: The right ventricular size is normal. No increase in right ventricular wall thickness. Right ventricular systolic function is normal. There is normal pulmonary artery systolic pressure. The tricuspid regurgitant velocity is 2.63 m/s, and  with an assumed right atrial pressure of 5 mmHg, the estimated right ventricular systolic pressure is 60.1 mmHg. Left Atrium: Left atrial size was normal in size. Right Atrium: Right atrial size was normal in size. Pericardium: There is no evidence of pericardial effusion. Presence of epicardial  fat layer. Mitral Valve: The mitral valve is normal in structure. Mild mitral valve regurgitation. No evidence of mitral valve stenosis. Tricuspid Valve: The tricuspid valve is normal in structure. Tricuspid valve regurgitation is moderate . No evidence of tricuspid stenosis. Aortic Valve: The aortic valve is tricuspid. Aortic valve regurgitation is not visualized. Aortic valve sclerosis is present, with no evidence of aortic valve stenosis. Pulmonic Valve: The pulmonic valve was normal in structure. Pulmonic valve regurgitation is trivial. No evidence of pulmonic stenosis. Aorta: The aortic root is normal in size and structure. There is mild dilatation of the ascending aorta, measuring 38 mm. Venous: The inferior vena cava is normal in size with greater than 50% respiratory variability, suggesting right atrial pressure of 3 mmHg. IAS/Shunts: No atrial level shunt detected by color flow Doppler.  LEFT VENTRICLE PLAX 2D LVIDd:         5.20 cm   Diastology LVIDs:  3.40 cm   LV e' medial:    8.05 cm/s LV PW:         1.10 cm   LV E/e' medial:  12.4 LV IVS:        1.10 cm   LV e' lateral:   9.68 cm/s LVOT diam:     1.80 cm   LV E/e' lateral: 10.3 LV SV:         69 LV SV Index:   31 LVOT Area:     2.54 cm  RIGHT VENTRICLE            IVC RV S prime:     9.79 cm/s  IVC diam: 2.40 cm TAPSE (M-mode): 2.3 cm LEFT ATRIUM             Index        RIGHT ATRIUM           Index LA diam:        3.70 cm 1.67 cm/m   RA Area:     15.40 cm LA Vol (A2C):   51.1 ml 23.10 ml/m  RA Volume:   37.70 ml  17.04 ml/m LA Vol (A4C):   55.5 ml 25.08 ml/m LA Biplane Vol: 55.5 ml 25.08 ml/m  AORTIC VALVE LVOT Vmax:   124.00 cm/s LVOT Vmean:  83.400 cm/s LVOT VTI:    0.272 m  AORTA Ao Root diam: 3.20 cm Ao Asc diam:  3.80 cm MITRAL VALVE                TRICUSPID VALVE MV Area (PHT): 2.91 cm     TR Peak grad:   27.7 mmHg MV Decel Time: 261 msec     TR Vmax:        263.00 cm/s MV E velocity: 100.00 cm/s MV A velocity: 70.00 cm/s   SHUNTS  MV E/A ratio:  1.43         Systemic VTI:  0.27 m                             Systemic Diam: 1.80 cm Kardie Tobb DO Electronically signed by Berniece Salines DO Signature Date/Time: 12/26/2021/7:30:10 PM    Final    DG ERCP  Result Date: 12/26/2021 CLINICAL DATA:  66 year old female with a history choledocholithiasis EXAM: ERCP TECHNIQUE: Multiple spot images obtained with the fluoroscopic device and submitted for interpretation post-procedure. FLUOROSCOPY: Radiation Exposure Index (as provided by the fluoroscopic device): 35.7 mGy Kerma COMPARISON:  None FINDINGS: Limited intraoperative fluoroscopic spot images during ERCP. Initial image demonstrates endoscope projecting over the upper abdomen with surgical changes of prior cholecystectomy. Subsequently there is placement of safety wire and infusion of contrast partially opacifying intrahepatic and extrahepatic ducts. Balloon angioplasty performed and then deployment of a retrieval balloon. IMPRESSION: Limited images during ERCP demonstrates treatment of choledocholithiasis with deployment of retrieval balloon. Please refer to the dictated operative report for full details of intraoperative findings and procedure. Electronically Signed   By: Corrie Mckusick D.O.   On: 12/26/2021 11:33   MR ABDOMEN MRCP W WO CONTAST  Result Date: 12/25/2021 CLINICAL DATA:  Evaluate for choledocholithiasis. EXAM: MRI ABDOMEN WITHOUT AND WITH CONTRAST (INCLUDING MRCP) TECHNIQUE: Multiplanar multisequence MR imaging of the abdomen was performed both before and after the administration of intravenous contrast. Heavily T2-weighted images of the biliary and pancreatic ducts were obtained, and three-dimensional MRCP images were rendered by post processing. CONTRAST:  76m GADAVIST GADOBUTROL  1 MMOL/ML IV SOLN COMPARISON:  CT AP from earlier today FINDINGS: Lower chest: No acute findings. Hepatobiliary: Diffuse hepatic steatosis noted. There is relative hypertrophy of the lateral segment of  left hepatic lobe and caudate hepatic lobe. Heterogeneous arterial phase enhancement is identified within the liver which may reflect underlying hepatocellular disease. No suspicious enhancing liver lesions identified. The gallbladder is surgically absent. Increase caliber of the CBD measures up to 8 mm. Filling defect within the distal common bile duct measures 1.4 cm in length with a diameter of 5 mm. No significant intrahepatic bile duct dilatation. Pancreas: No mass, inflammatory changes, or other parenchymal abnormality identified. Spleen: The spleen measures 14.6 cm in length. No focal splenic abnormality. Adrenals/Urinary Tract: Normal adrenal glands. No signs of kidney mass or hydronephrosis. Stomach/Bowel: Stomach appears normal. There is no pathologic dilatation of the visualized bowel loops. Again noted is diffuse intramural fatty deposition throughout the colon. Vascular/Lymphatic: Normal caliber of the abdominal aorta. Multiple enlarged lymph nodes are identified within the upper abdomen, nonspecific in the setting of cirrhosis. Index peripancreatic node measures 1.9 cm, image 23/4. Index portacaval lymph node measures 1.8 cm, image 26/4. Index gastrohepatic ligament lymph node measures 1.3 cm, image 43/15. Other: Trace perihepatic fluid. No focal fluid collections identified Musculoskeletal: No suspicious bone lesions identified. IMPRESSION: 1. Exam positive for choledocholithiasis. Solitary stone within the distal common bile duct measures 1.4 cm in length with a diameter of 5 mm. 2. Diffuse hepatic steatosis with morphologic features of liver compatible with cirrhosis. 3. Multiple enlarged lymph nodes within the upper abdomen, nonspecific in the setting of cirrhosis. 4. Mild splenomegaly. Electronically Signed   By: Kerby Moors M.D.   On: 12/25/2021 09:45   MR 3D Recon At Scanner  Result Date: 12/25/2021 CLINICAL DATA:  Evaluate for choledocholithiasis. EXAM: MRI ABDOMEN WITHOUT AND WITH  CONTRAST (INCLUDING MRCP) TECHNIQUE: Multiplanar multisequence MR imaging of the abdomen was performed both before and after the administration of intravenous contrast. Heavily T2-weighted images of the biliary and pancreatic ducts were obtained, and three-dimensional MRCP images were rendered by post processing. CONTRAST:  74m GADAVIST GADOBUTROL 1 MMOL/ML IV SOLN COMPARISON:  CT AP from earlier today FINDINGS: Lower chest: No acute findings. Hepatobiliary: Diffuse hepatic steatosis noted. There is relative hypertrophy of the lateral segment of left hepatic lobe and caudate hepatic lobe. Heterogeneous arterial phase enhancement is identified within the liver which may reflect underlying hepatocellular disease. No suspicious enhancing liver lesions identified. The gallbladder is surgically absent. Increase caliber of the CBD measures up to 8 mm. Filling defect within the distal common bile duct measures 1.4 cm in length with a diameter of 5 mm. No significant intrahepatic bile duct dilatation. Pancreas: No mass, inflammatory changes, or other parenchymal abnormality identified. Spleen: The spleen measures 14.6 cm in length. No focal splenic abnormality. Adrenals/Urinary Tract: Normal adrenal glands. No signs of kidney mass or hydronephrosis. Stomach/Bowel: Stomach appears normal. There is no pathologic dilatation of the visualized bowel loops. Again noted is diffuse intramural fatty deposition throughout the colon. Vascular/Lymphatic: Normal caliber of the abdominal aorta. Multiple enlarged lymph nodes are identified within the upper abdomen, nonspecific in the setting of cirrhosis. Index peripancreatic node measures 1.9 cm, image 23/4. Index portacaval lymph node measures 1.8 cm, image 26/4. Index gastrohepatic ligament lymph node measures 1.3 cm, image 43/15. Other: Trace perihepatic fluid. No focal fluid collections identified Musculoskeletal: No suspicious bone lesions identified. IMPRESSION: 1. Exam positive  for choledocholithiasis. Solitary stone within the distal common bile duct  measures 1.4 cm in length with a diameter of 5 mm. 2. Diffuse hepatic steatosis with morphologic features of liver compatible with cirrhosis. 3. Multiple enlarged lymph nodes within the upper abdomen, nonspecific in the setting of cirrhosis. 4. Mild splenomegaly. Electronically Signed   By: Kerby Moors M.D.   On: 12/25/2021 09:45   CT ABDOMEN PELVIS W CONTRAST  Result Date: 12/25/2021 CLINICAL DATA:  Chronic epigastric pain, concern for biliary pathology. EXAM: CT ABDOMEN AND PELVIS WITH CONTRAST TECHNIQUE: Multidetector CT imaging of the abdomen and pelvis was performed using the standard protocol following bolus administration of intravenous contrast. RADIATION DOSE REDUCTION: This exam was performed according to the departmental dose-optimization program which includes automated exposure control, adjustment of the mA and/or kV according to patient size and/or use of iterative reconstruction technique. CONTRAST:  162m OMNIPAQUE IOHEXOL 300 MG/ML  SOLN COMPARISON:  Ultrasound Oct 09, 2021 FINDINGS: Lower chest: No acute abnormality. Hepatobiliary: Hypodense hepatic parenchyma commonly reflects hepatic steatosis. Hepatic contour nodularity with increased size the caudate lobe likely reflects hepatic steatosis. Gallbladder surgically absent. Small volume pneumobilia on coronal image 65/5. Unchanged prominence of the extrahepatic biliary tree with the common duct measuring 10 mm in maximum diameter. Possible subtle peribiliary enhancement. Pancreas: No pancreatic ductal dilation or evidence of acute inflammation. Spleen: Splenomegaly measuring 15.6 cm in maximum axial dimension. Adrenals/Urinary Tract: Bilateral adrenal glands appear normal. No hydronephrosis. Kidneys demonstrate symmetric enhancement and excretion of contrast material. Urinary bladder is unremarkable for degree of distension. Stomach/Bowel: No radiopaque enteric  contrast material was administered. Stomach is minimally distended with ingested material and gas without focal wall thickening. No pathologic dilation of small or large bowel. The appendix and terminal ileum appear normal. Near pancolonic fatty infiltration of the colonic wall worse in the ascending and transverse colon. No evidence of acute bowel inflammation. Descending and sigmoid colonic diverticulosis. Vascular/Lymphatic: Normal caliber abdominal aorta. Enlarged/prominent gastrohepatic ligament, aortocaval hepatoduodenal and retroperitoneal lymph nodes. For reference a gastrohepatic ligament lymph node measures 11 mm in short axis on image 27/2 and a aortocaval lymph node measures 15 mm in short axis on image 37/2. Reproductive: Lobular uterine contour including a masslike excretion from the uterus measuring 8.4 cm reflecting uterine leiomyomas as seen on prior pelvic ultrasound. No suspicious adnexal mass. Other: No significant abdominopelvic free fluid. Musculoskeletal: No acute osseous abnormality. IMPRESSION: 1. Unchanged prominence of the extrahepatic biliary tree now with subtle peribiliary enhancement and a small volume pneumobilia, the prominence of the biliary tree is at least somewhat related to prior cholecystectomy, with the additional findings possibly reflecting superimposed sequela of a recently passed choledocholithiasis. However, cholangitis is a pertinent differential consideration. Suggest correlation with laboratory values and if clinically indicated further assessment by MRCP with and without contrast. 2. Cirrhotic hepatic morphology with hepatic steatosis and sequela of portal venous hypertension. Electronically Signed   By: JDahlia BailiffM.D.   On: 12/25/2021 08:19       Subjective: REPORTS FEELING MUCH BETTER  Objective: Vitals:   12/27/21 1559 12/27/21 2036 12/28/21 0438 12/28/21 0734  BP: 93/61 133/71 118/70 130/83  Pulse: 72 70 66 69  Resp: '16 20 20 16  '$ Temp: 98.8 F  (37.1 C) 98.2 F (36.8 C) 98.2 F (36.8 C) 98.1 F (36.7 C)  TempSrc: Oral  Oral Oral  SpO2: 98% 99% 95% 96%  Weight:      Height:        Intake/Output Summary (Last 24 hours) at 12/28/2021 1505 Last data filed at 12/28/2021 [phone]  Gross per 24 hour  Intake 240 ml  Output --  Net 240 ml   Filed Weights   12/25/21 0527  Weight: 106.6 kg    Examination:  General exam: Appears calm and comfortable  Respiratory system: Clear to auscultation. Respiratory effort normal. Cardiovascular system: S1 & S2 heard, RRR. No JVD, murmurs, rubs, gallops or clicks. No pedal edema. Gastrointestinal system: Abdomen is nondistended, soft and nontender. No organomegaly or masses felt. Normal bowel sounds heard. Central nervous system: Alert and oriented. No focal neurological deficits. Extremities: Symmetric 5 x 5 power. Skin: No rashes, lesions or ulcers Psychiatry: Judgement and insight appear normal. Mood & affect appropriate.     Data Reviewed: I have personally reviewed following labs and imaging studies  CBC: Recent Labs  Lab 12/25/21 0620 12/26/21 0130 12/27/21 0557 12/28/21 0101  WBC 9.8 10.9* 8.4 6.5  NEUTROABS 8.8*  --   --   --   HGB 11.7* 8.8* 8.0* 8.3*  HCT 35.6* 26.2* 24.7* 24.7*  MCV 104.7* 102.3* 106.5* 105.6*  PLT 258 203 175 539   Basic Metabolic Panel: Recent Labs  Lab 12/25/21 0620 12/26/21 0130 12/27/21 0557 12/28/21 0101  NA 142 138 141 140  K 4.4 3.9 4.0 3.7  CL 108 109 112* 110  CO2 '25 24 22 25  '$ GLUCOSE 130* 87 95 97  BUN 24* 24* 13 13  CREATININE 0.91 0.95 0.83 0.98  CALCIUM 8.6* 8.2* 8.2* 8.1*  MG  --  2.1  --   --   PHOS  --  2.6  --   --    GFR: Estimated Creatinine Clearance: 74.4 mL/min (by C-G formula based on SCr of 0.98 mg/dL). Liver Function Tests: Recent Labs  Lab 12/25/21 0620 12/26/21 0130 12/27/21 0557 12/28/21 0101  AST 98* 55* 30 30  ALT 97* 86* 64* 58*  ALKPHOS 141* 107 94 98  BILITOT 6.0* 4.1* 1.6* 1.7*  PROT 7.3 5.6*  5.6* 5.6*  ALBUMIN 3.8 3.0* 2.9* 2.9*   Recent Labs  Lab 12/25/21 0620  LIPASE 41   No results for input(s): "AMMONIA" in the last 168 hours. Coagulation Profile: Recent Labs  Lab 12/25/21 1616  INR 1.1   Cardiac Enzymes: No results for input(s): "CKTOTAL", "CKMB", "CKMBINDEX", "TROPONINI" in the last 168 hours. BNP (last 3 results) No results for input(s): "PROBNP" in the last 8760 hours. HbA1C: No results for input(s): "HGBA1C" in the last 72 hours. CBG: No results for input(s): "GLUCAP" in the last 168 hours. Lipid Profile: No results for input(s): "CHOL", "HDL", "LDLCALC", "TRIG", "CHOLHDL", "LDLDIRECT" in the last 72 hours. Thyroid Function Tests: No results for input(s): "TSH", "T4TOTAL", "FREET4", "T3FREE", "THYROIDAB" in the last 72 hours. Anemia Panel: Recent Labs    12/26/21 0628  VITAMINB12 207  FOLATE 3.8*  FERRITIN 362*  TIBC 374  IRON 129   Sepsis Labs: Recent Labs  Lab 12/25/21 7673 12/25/21 1132 12/25/21 1325  LATICACIDVEN 2.7* 1.8 1.6    Recent Results (from the past 240 hour(s))  Blood culture (routine x 2)     Status: None (Preliminary result)   Collection Time: 12/25/21 11:32 AM   Specimen: BLOOD  Result Value Ref Range Status   Specimen Description   Final    BLOOD LEFT ANTECUBITAL Performed at [facility], [place] [address], [place], [place] [address]    Special Requests   Final    BOTTLES DRAWN AEROBIC AND ANAEROBIC Blood Culture results may not be optimal due to an excessive  volume of blood received in culture bottles   Culture   Final    NO GROWTH 3 DAYS Performed at [facility], [address], [place], [place] [address]    Report Status PENDING  Incomplete  Blood culture (routine x 2)     Status: Abnormal   Collection Time: 12/25/21 11:32 AM   Specimen: BLOOD  Result Value Ref Range Status   Specimen Description   Final    BLOOD RIGHT ANTECUBITAL Performed at [facility], [place] [address], [place], [place]  [id number]    Special Requests   Final    BOTTLES DRAWN AEROBIC AND ANAEROBIC Blood Culture adequate volume Performed at [facility], [address] [place], [place] [address]    Culture  Setup Time   Final    GRAM POSITIVE COCCI ANAEROBIC BOTTLE Gram Stain Report Called to,Read Back By and Verified With: JOHNSON,NEIL'@0500'$  BY MATTHEWS,B 8.3.2023 Organism ID to follow CRITICAL RESULT CALLED TO, READ BACK BY AND VERIFIED WITH: Jule Economy [id number] MLM Performed at [facility], [place] [address], [place], [place] [address]    Culture ENTEROCOCCUS FAECIUM (A)  Final   Report Status 12/28/2021 FINAL  Final   Organism ID, Bacteria ENTEROCOCCUS FAECIUM  Final      Susceptibility   Enterococcus faecium - MIC*    AMPICILLIN <=2 SENSITIVE Sensitive     VANCOMYCIN <=0.5 SENSITIVE Sensitive     GENTAMICIN SYNERGY SENSITIVE Sensitive     * ENTEROCOCCUS FAECIUM  Blood Culture ID Panel (Reflexed)     Status: Abnormal   Collection Time: 12/25/21 11:32 AM  Result Value Ref Range Status   Enterococcus faecalis NOT DETECTED NOT DETECTED Final   Enterococcus Faecium DETECTED (A) NOT DETECTED Final    Comment: CRITICAL RESULT CALLED TO, READ BACK BY AND VERIFIED WITH: PHARMD AUSTIN P [id number] MLM    Listeria monocytogenes NOT DETECTED NOT DETECTED Final   Staphylococcus species NOT DETECTED NOT DETECTED Final   Staphylococcus aureus (BCID) NOT DETECTED NOT DETECTED Final   Staphylococcus epidermidis NOT DETECTED NOT DETECTED Final   Staphylococcus lugdunensis NOT DETECTED NOT DETECTED Final   Streptococcus species NOT DETECTED NOT DETECTED Final   Streptococcus agalactiae NOT DETECTED NOT DETECTED Final   Streptococcus pneumoniae NOT DETECTED NOT DETECTED Final   Streptococcus pyogenes NOT DETECTED NOT DETECTED Final   A.calcoaceticus-baumannii NOT DETECTED NOT DETECTED Final   Bacteroides fragilis NOT DETECTED NOT DETECTED Final   Enterobacterales NOT DETECTED NOT DETECTED Final    Enterobacter cloacae complex NOT DETECTED NOT DETECTED Final   Escherichia coli NOT DETECTED NOT DETECTED Final   Klebsiella aerogenes NOT DETECTED NOT DETECTED Final   Klebsiella oxytoca NOT DETECTED NOT DETECTED Final   Klebsiella pneumoniae NOT DETECTED NOT DETECTED Final   Proteus species NOT DETECTED NOT DETECTED Final   Salmonella species NOT DETECTED NOT DETECTED Final   Serratia marcescens NOT DETECTED NOT DETECTED Final   Haemophilus influenzae NOT DETECTED NOT DETECTED Final   Neisseria meningitidis NOT DETECTED NOT DETECTED Final   Pseudomonas aeruginosa NOT DETECTED NOT DETECTED Final   Stenotrophomonas maltophilia NOT DETECTED NOT DETECTED Final   Candida albicans NOT DETECTED NOT DETECTED Final   Candida auris NOT DETECTED NOT DETECTED Final   Candida glabrata NOT DETECTED NOT DETECTED Final   Candida krusei NOT DETECTED NOT DETECTED Final   Candida parapsilosis NOT DETECTED NOT DETECTED Final   Candida tropicalis NOT DETECTED NOT DETECTED Final   Cryptococcus neoformans/gattii NOT DETECTED  NOT DETECTED Final   Vancomycin resistance NOT DETECTED NOT DETECTED Final    Comment: Performed at [facility], [place] [address] [place], [place] [address]  Culture, blood (Routine X 2) w Reflex to ID Panel     Status: None (Preliminary result)   Collection Time: 12/26/21  2:11 PM   Specimen: BLOOD LEFT HAND  Result Value Ref Range Status   Specimen Description BLOOD LEFT HAND  Final   Special Requests   Final    BOTTLES DRAWN AEROBIC AND ANAEROBIC Blood Culture adequate volume   Culture   Final    NO GROWTH 2 DAYS Performed at [facility], [place] [address], [place], [place] [address]    Report Status PENDING  Incomplete  Culture, blood (Routine X 2) w Reflex to ID Panel     Status: None (Preliminary result)   Collection Time: 12/26/21  2:14 PM   Specimen: BLOOD  Result Value Ref Range Status   Specimen Description BLOOD RIGHT ANTECUBITAL  Final   Special Requests    Final    BOTTLES DRAWN AEROBIC AND ANAEROBIC Blood Culture adequate volume   Culture   Final    NO GROWTH 2 DAYS Performed at [facility], [place] [address] [place], [place] [address]    Report Status PENDING  Incomplete  Culture, blood (Routine X 2) w Reflex to ID Panel     Status: None (Preliminary result)   Collection Time: 12/27/21  5:57 AM   Specimen: BLOOD RIGHT HAND  Result Value Ref Range Status   Specimen Description BLOOD RIGHT HAND  Final   Special Requests   Final    BOTTLES DRAWN AEROBIC AND ANAEROBIC Blood Culture adequate volume   Culture   Final    NO GROWTH 1 DAY Performed at [facility], [place] [address] [place], [place] [address]    Report Status PENDING  Incomplete  Culture, blood (Routine X 2) w Reflex to ID Panel     Status: None (Preliminary result)   Collection Time: 12/27/21  6:10 AM   Specimen: BLOOD  Result Value Ref Range Status   Specimen Description BLOOD LEFT ANTECUBITAL  Final   Special Requests   Final    BOTTLES DRAWN AEROBIC AND ANAEROBIC Blood Culture results may not be optimal due to an inadequate volume of blood received in culture bottles   Culture   Final    NO GROWTH 1 DAY Performed at [facility], [place] [address] [place], [place] [address]    Report Status PENDING  Incomplete         Radiology Studies: ECHOCARDIOGRAM COMPLETE  Result Date: 12/26/2021    ECHOCARDIOGRAM REPORT   Patient Name:   Cynthia Donaldson Date of Exam: 12/26/2021 Medical Rec #:  [id number]      Height:       69.0 in Accession #:    [id number]     Weight:       235.0 lb Date of Birth:  [birth year]      BSA:          2.213 m Patient Age:    62 years       BP:           93/62 mmHg Patient Gender: F              HR:           68 bpm. Exam Location:  Inpatient Procedure: 2D Echo  Indications:    Bacteremia  History:        Patient has no prior history of Echocardiogram examinations.                 Risk Factors:Former Smoker.  Sonographer:    Johny Chess  RDCS Referring Phys: [id number] [place]  1. Left ventricular ejection fraction, by estimation, is 60 to 65%. The left ventricle has normal function. The left ventricle has no regional wall motion abnormalities. There is mild concentric left ventricular hypertrophy. Left ventricular diastolic parameters were normal.  2. Right ventricular systolic function is normal. The right ventricular size is normal. There is normal pulmonary artery systolic pressure.  3. The mitral valve is normal in structure. Mild mitral valve regurgitation. No evidence of mitral stenosis.  4. Tricuspid valve regurgitation is moderate.  5. The aortic valve is tricuspid. Aortic valve regurgitation is not visualized. Aortic valve sclerosis is present, with no evidence of aortic valve stenosis.  6. There is mild dilatation of the ascending aorta, measuring 38 mm.  7. The inferior vena cava is normal in size with greater than 50% respiratory variability, suggesting right atrial pressure of 3 mmHg. FINDINGS  Left Ventricle: Left ventricular ejection fraction, by estimation, is 60 to 65%. The left ventricle has normal function. The left ventricle has no regional wall motion abnormalities. The left ventricular internal cavity size was normal in size. There is  mild concentric left ventricular hypertrophy. Left ventricular diastolic parameters were normal. Right Ventricle: The right ventricular size is normal. No increase in right ventricular wall thickness. Right ventricular systolic function is normal. There is normal pulmonary artery systolic pressure. The tricuspid regurgitant velocity is 2.63 m/s, and  with an assumed right atrial pressure of 5 mmHg, the estimated right ventricular systolic pressure is 16.0 mmHg. Left Atrium: Left atrial size was normal in size. Right Atrium: Right atrial size was normal in size. Pericardium: There is no evidence of pericardial effusion. Presence of epicardial fat layer. Mitral Valve: The mitral  valve is normal in structure. Mild mitral valve regurgitation. No evidence of mitral valve stenosis. Tricuspid Valve: The tricuspid valve is normal in structure. Tricuspid valve regurgitation is moderate . No evidence of tricuspid stenosis. Aortic Valve: The aortic valve is tricuspid. Aortic valve regurgitation is not visualized. Aortic valve sclerosis is present, with no evidence of aortic valve stenosis. Pulmonic Valve: The pulmonic valve was normal in structure. Pulmonic valve regurgitation is trivial. No evidence of pulmonic stenosis. Aorta: The aortic root is normal in size and structure. There is mild dilatation of the ascending aorta, measuring 38 mm. Venous: The inferior vena cava is normal in size with greater than 50% respiratory variability, suggesting right atrial pressure of 3 mmHg. IAS/Shunts: No atrial level shunt detected by color flow Doppler.  LEFT VENTRICLE PLAX 2D LVIDd:         5.20 cm   Diastology LVIDs:         3.40 cm   LV e' medial:    8.05 cm/s LV PW:         1.10 cm   LV E/e' medial:  12.4 LV IVS:        1.10 cm   LV e' lateral:   9.68 cm/s LVOT diam:     1.80 cm   LV E/e' lateral: 10.3 LV SV:         69 LV SV Index:   31 LVOT Area:     2.54 cm  RIGHT VENTRICLE  IVC RV S prime:     9.79 cm/s  IVC diam: 2.40 cm TAPSE (M-mode): 2.3 cm LEFT ATRIUM             Index        RIGHT ATRIUM           Index LA diam:        3.70 cm 1.67 cm/m   RA Area:     15.40 cm LA Vol (A2C):   51.1 ml 23.10 ml/m  RA Volume:   37.70 ml  17.04 ml/m LA Vol (A4C):   55.5 ml 25.08 ml/m LA Biplane Vol: 55.5 ml 25.08 ml/m  AORTIC VALVE LVOT Vmax:   124.00 cm/s LVOT Vmean:  83.400 cm/s LVOT VTI:    0.272 m  AORTA Ao Root diam: 3.20 cm Ao Asc diam:  3.80 cm MITRAL VALVE                TRICUSPID VALVE MV Area (PHT): 2.91 cm     TR Peak grad:   27.7 mmHg MV Decel Time: 261 msec     TR Vmax:        263.00 cm/s MV E velocity: 100.00 cm/s MV A velocity: 70.00 cm/s   SHUNTS MV E/A ratio:  1.43         Systemic  VTI:  0.27 m                             Systemic Diam: 1.80 cm Kardie Tobb DO Electronically signed by Berniece Salines DO Signature Date/Time: 12/26/2021/7:30:10 PM    Final         Scheduled Meds:  buPROPion  450 mg Oral Daily   vitamin B-12  1,000 mcg Oral Daily   enoxaparin (LOVENOX) injection  50 mg Subcutaneous Daily   folic acid  1 mg Oral Daily   levothyroxine  150 mcg Oral [id number]   Obeticholic Acid  5 mg Oral Daily   ursodiol  600 mg Oral TID   Continuous Infusions:  vancomycin 1,500 mg (12/28/21 1356)     LOS: 3 days    Time spent: [place]    Nicolette Bang, MD [facility]  If 7PM-7AM, please contact night-coverage  12/28/2021, 3:05 PM

## 2021-12-28 NOTE — Plan of Care (Signed)
  Problem: Nutrition: Goal: Adequate nutrition will be maintained Outcome: Progressing   

## 2021-12-29 DIAGNOSIS — E669 Obesity, unspecified: Secondary | ICD-10-CM | POA: Diagnosis not present

## 2021-12-29 DIAGNOSIS — K8050 Calculus of bile duct without cholangitis or cholecystitis without obstruction: Secondary | ICD-10-CM | POA: Diagnosis not present

## 2021-12-29 DIAGNOSIS — R1013 Epigastric pain: Secondary | ICD-10-CM | POA: Diagnosis not present

## 2021-12-29 DIAGNOSIS — F32A Depression, unspecified: Secondary | ICD-10-CM | POA: Diagnosis not present

## 2021-12-29 LAB — BASIC METABOLIC PANEL
Anion gap: 6 (ref 5–15)
BUN: 12 mg/dL (ref 8–23)
CO2: 23 mmol/L (ref 22–32)
Calcium: 8.5 mg/dL — ABNORMAL LOW (ref 8.9–10.3)
Chloride: 108 mmol/L (ref 98–111)
Creatinine, Ser: 0.91 mg/dL (ref 0.44–1.00)
GFR, Estimated: >60 mL/min (ref >60)
Glucose, Bld: 97 mg/dL (ref 70–99)
Potassium: 3.3 mmol/L — ABNORMAL LOW (ref 3.5–5.1)
Sodium: 137 mmol/L (ref 135–145)

## 2021-12-29 MED ORDER — ONDANSETRON HCL 4 MG PO TABS: 4.0000 mg | ORAL_TABLET | Freq: Every day | ORAL | 0 refills | Status: DC | PRN

## 2021-12-29 MED ORDER — LINEZOLID 600 MG PO TABS: 600.0000 mg | ORAL_TABLET | Freq: Two times a day (BID) | ORAL | 0 refills | Status: AC

## 2021-12-29 NOTE — Progress Notes (Signed)
Pt discharged home in stable condition 

## 2021-12-29 NOTE — Discharge Summary (Signed)
Physician Discharge Summary  Cynthia Donaldson WUJ:[id number] DOB: [birth year] DOA: 12/25/2021  PCP: Asencion Noble, MD  Admit date: 12/25/2021 Discharge date: 12/29/2021  Admitted From: Inpatient Disposition: home  Recommendations for Outpatient Follow-up:  Follow up with PCP in 1-2 weeks   Home Health:No Equipment/Devices:NO NEW EQUIPMENT  Discharge Condition:Stable CODE STATUS:Full code Diet recommendation: Regular healthy diet  Brief/Interim Summary: Patient is a 66 year old female with history of [place], obesity, depression, hypothyroidism, primary biliary cirrhosis who presented to the emergency department at [facility] with complaint of abdominal pain, vomiting.  On presentation, lab work showed elevated LFTs, elevated bilirubin.  Imaging studies were assessed to have choledocholithiasis with retained stone, dilated CBD.  Patient was transferred to [facility] for ERCP.   Status post ERCP, removal of stone.  Hospital was also remarkable for finding of Enterococcus faecium bacteremia, ID consulted.  Pt hospital stay was extended as we were waiting for sensitivity and negative blood culture for 48 hours before consideration of discharge on oral antibiotics as per ID.Marland Kitchenanticipate d/c on 8/6 They returned and after discussion with ID antibiotics were changed from amoxicillin 1 g 3 times daily to linezolid 600 mg twice daily for 14-day course of treatment.  Patient's abdominal pain nausea and vomiting have resolved choledocholithiasis was resolved after ERCP and stone extraction, as noted she had Enterococcus faecium bacteremia which was treated with antibiotics as above.  She will continue home medications for hypothyroidism and depression microcytic anemia sleep apnea and obesity.  She does have a history of Karlene Lineman will follow-up with outpatient GI  Discharge Diagnoses:  Principal Problem:   Choledocholithiasis Active Problems:   Primary biliary cholangitis (HCC)   Nonalcoholic steatohepatitis  (NASH)   Abdominal pain, epigastric   Class 1 obesity   Hypothyroidism   Depression    Discharge Instructions  Discharge Instructions     Call MD for:  extreme fatigue   Complete by: As directed    Call MD for:  persistant dizziness or light-headedness   Complete by: As directed    Call MD for:  persistant nausea and vomiting   Complete by: As directed    Call MD for:  severe uncontrolled pain   Complete by: As directed    Call MD for:  temperature >100.4   Complete by: As directed    Diet - low sodium heart healthy   Complete by: As directed    Increase activity slowly   Complete by: As directed       Allergies as of 12/29/2021       Reactions   Codeine Nausea And Vomiting        Medication List     TAKE these medications    buPROPion 150 MG 24 hr tablet Commonly known as: WELLBUTRIN XL TAKE 3 TABLETS BY MOUTH ONCE A DAY   dicyclomine 10 MG capsule Commonly known as: BENTYL Take 1 capsule (10 mg total) by mouth 2 (two) times daily before a meal. What changed:  when to take this reasons to take this   fluconazole 150 MG tablet Commonly known as: DIFLUCAN Take 1 now and repeat 1 in 3 days   levothyroxine 150 MCG tablet Commonly known as: SYNTHROID TAKE 1 TABLET BY MOUTH ONCE DAILY   linezolid 600 MG tablet Commonly known as: ZYVOX Take 1 tablet (600 mg total) by mouth 2 (two) times daily for 11 days.   neomycin-polymyxin-hydrocortisone 3.5-10000-1 OTIC suspension Commonly known as: CORTISPORIN Place 4 drops into the left ear 4 (four)  times daily. X 7 days   Ocaliva 5 MG Tabs Generic drug: Obeticholic Acid Take 1 tablet by mouth daily. What changed: when to take this   ondansetron 4 MG tablet Commonly known as: Zofran Take 1 tablet (4 mg total) by mouth daily as needed for nausea or vomiting.   triamcinolone ointment 0.5 % Commonly known as: KENALOG Apply 1 application topically 2 (two) times daily.   ursodiol 500 MG tablet Commonly known  as: ACTIGALL TAKE 1 TABLET BY MOUTH 3 TIMES DAILY        Allergies  Allergen Reactions   Codeine Nausea And Vomiting    Consultations: gi   Procedures/Studies: ECHOCARDIOGRAM COMPLETE  Result Date: 12/26/2021    ECHOCARDIOGRAM REPORT   Patient Name:   Cynthia Donaldson Date of Exam: 12/26/2021 Medical Rec #:  [id number]      Height:       69.0 in Accession #:    [id number]     Weight:       235.0 lb Date of Birth:  [birth year]      BSA:          2.213 m Patient Age:    66 years       BP:           93/62 mmHg Patient Gender: F              HR:           68 bpm. Exam Location:  Inpatient Procedure: 2D Echo Indications:    Bacteremia  History:        Patient has no prior history of Echocardiogram examinations.                 Risk Factors:Former Smoker.  Sonographer:    Johny Chess RDCS Referring Phys: [id number] [place]  1. Left ventricular ejection fraction, by estimation, is 60 to 65%. The left ventricle has normal function. The left ventricle has no regional wall motion abnormalities. There is mild concentric left ventricular hypertrophy. Left ventricular diastolic parameters were normal.  2. Right ventricular systolic function is normal. The right ventricular size is normal. There is normal pulmonary artery systolic pressure.  3. The mitral valve is normal in structure. Mild mitral valve regurgitation. No evidence of mitral stenosis.  4. Tricuspid valve regurgitation is moderate.  5. The aortic valve is tricuspid. Aortic valve regurgitation is not visualized. Aortic valve sclerosis is present, with no evidence of aortic valve stenosis.  6. There is mild dilatation of the ascending aorta, measuring 38 mm.  7. The inferior vena cava is normal in size with greater than 50% respiratory variability, suggesting right atrial pressure of 3 mmHg. FINDINGS  Left Ventricle: Left ventricular ejection fraction, by estimation, is 60 to 65%. The left ventricle has normal function. The left  ventricle has no regional wall motion abnormalities. The left ventricular internal cavity size was normal in size. There is  mild concentric left ventricular hypertrophy. Left ventricular diastolic parameters were normal. Right Ventricle: The right ventricular size is normal. No increase in right ventricular wall thickness. Right ventricular systolic function is normal. There is normal pulmonary artery systolic pressure. The tricuspid regurgitant velocity is 2.63 m/s, and  with an assumed right atrial pressure of 5 mmHg, the estimated right ventricular systolic pressure is 59.5 mmHg. Left Atrium: Left atrial size was normal in size. Right Atrium: Right atrial size was normal in size. Pericardium: There is no evidence of pericardial effusion. Presence of epicardial  fat layer. Mitral Valve: The mitral valve is normal in structure. Mild mitral valve regurgitation. No evidence of mitral valve stenosis. Tricuspid Valve: The tricuspid valve is normal in structure. Tricuspid valve regurgitation is moderate . No evidence of tricuspid stenosis. Aortic Valve: The aortic valve is tricuspid. Aortic valve regurgitation is not visualized. Aortic valve sclerosis is present, with no evidence of aortic valve stenosis. Pulmonic Valve: The pulmonic valve was normal in structure. Pulmonic valve regurgitation is trivial. No evidence of pulmonic stenosis. Aorta: The aortic root is normal in size and structure. There is mild dilatation of the ascending aorta, measuring 38 mm. Venous: The inferior vena cava is normal in size with greater than 50% respiratory variability, suggesting right atrial pressure of 3 mmHg. IAS/Shunts: No atrial level shunt detected by color flow Doppler.  LEFT VENTRICLE PLAX 2D LVIDd:         5.20 cm   Diastology LVIDs:         3.40 cm   LV e' medial:    8.05 cm/s LV PW:         1.10 cm   LV E/e' medial:  12.4 LV IVS:        1.10 cm   LV e' lateral:   9.68 cm/s LVOT diam:     1.80 cm   LV E/e' lateral: 10.3 LV SV:          69 LV SV Index:   31 LVOT Area:     2.54 cm  RIGHT VENTRICLE            IVC RV S prime:     9.79 cm/s  IVC diam: 2.40 cm TAPSE (M-mode): 2.3 cm LEFT ATRIUM             Index        RIGHT ATRIUM           Index LA diam:        3.70 cm 1.67 cm/m   RA Area:     15.40 cm LA Vol (A2C):   51.1 ml 23.10 ml/m  RA Volume:   37.70 ml  17.04 ml/m LA Vol (A4C):   55.5 ml 25.08 ml/m LA Biplane Vol: 55.5 ml 25.08 ml/m  AORTIC VALVE LVOT Vmax:   124.00 cm/s LVOT Vmean:  83.400 cm/s LVOT VTI:    0.272 m  AORTA Ao Root diam: 3.20 cm Ao Asc diam:  3.80 cm MITRAL VALVE                TRICUSPID VALVE MV Area (PHT): 2.91 cm     TR Peak grad:   27.7 mmHg MV Decel Time: 261 msec     TR Vmax:        263.00 cm/s MV E velocity: 100.00 cm/s MV A velocity: 70.00 cm/s   SHUNTS MV E/A ratio:  1.43         Systemic VTI:  0.27 m                             Systemic Diam: 1.80 cm Kardie Tobb DO Electronically signed by Berniece Salines DO Signature Date/Time: 12/26/2021/7:30:10 PM    Final    DG ERCP  Result Date: 12/26/2021 CLINICAL DATA:  66 year old female with a history choledocholithiasis EXAM: ERCP TECHNIQUE: Multiple spot images obtained with the fluoroscopic device and submitted for interpretation post-procedure. FLUOROSCOPY: Radiation Exposure Index (as provided by the fluoroscopic device): 35.7 mGy Kerma COMPARISON:  None FINDINGS: Limited intraoperative fluoroscopic spot images during ERCP. Initial image demonstrates endoscope projecting over the upper abdomen with surgical changes of prior cholecystectomy. Subsequently there is placement of safety wire and infusion of contrast partially opacifying intrahepatic and extrahepatic ducts. Balloon angioplasty performed and then deployment of a retrieval balloon. IMPRESSION: Limited images during ERCP demonstrates treatment of choledocholithiasis with deployment of retrieval balloon. Please refer to the dictated operative report for full details of intraoperative findings and  procedure. Electronically Signed   By: Corrie Mckusick D.O.   On: 12/26/2021 11:33   MR ABDOMEN MRCP W WO CONTAST  Result Date: 12/25/2021 CLINICAL DATA:  Evaluate for choledocholithiasis. EXAM: MRI ABDOMEN WITHOUT AND WITH CONTRAST (INCLUDING MRCP) TECHNIQUE: Multiplanar multisequence MR imaging of the abdomen was performed both before and after the administration of intravenous contrast. Heavily T2-weighted images of the biliary and pancreatic ducts were obtained, and three-dimensional MRCP images were rendered by post processing. CONTRAST:  37m GADAVIST GADOBUTROL 1 MMOL/ML IV SOLN COMPARISON:  CT AP from earlier today FINDINGS: Lower chest: No acute findings. Hepatobiliary: Diffuse hepatic steatosis noted. There is relative hypertrophy of the lateral segment of left hepatic lobe and caudate hepatic lobe. Heterogeneous arterial phase enhancement is identified within the liver which may reflect underlying hepatocellular disease. No suspicious enhancing liver lesions identified. The gallbladder is surgically absent. Increase caliber of the CBD measures up to 8 mm. Filling defect within the distal common bile duct measures 1.4 cm in length with a diameter of 5 mm. No significant intrahepatic bile duct dilatation. Pancreas: No mass, inflammatory changes, or other parenchymal abnormality identified. Spleen: The spleen measures 14.6 cm in length. No focal splenic abnormality. Adrenals/Urinary Tract: Normal adrenal glands. No signs of kidney mass or hydronephrosis. Stomach/Bowel: Stomach appears normal. There is no pathologic dilatation of the visualized bowel loops. Again noted is diffuse intramural fatty deposition throughout the colon. Vascular/Lymphatic: Normal caliber of the abdominal aorta. Multiple enlarged lymph nodes are identified within the upper abdomen, nonspecific in the setting of cirrhosis. Index peripancreatic node measures 1.9 cm, image 23/4. Index portacaval lymph node measures 1.8 cm, image 26/4.  Index gastrohepatic ligament lymph node measures 1.3 cm, image 43/15. Other: Trace perihepatic fluid. No focal fluid collections identified Musculoskeletal: No suspicious bone lesions identified. IMPRESSION: 1. Exam positive for choledocholithiasis. Solitary stone within the distal common bile duct measures 1.4 cm in length with a diameter of 5 mm. 2. Diffuse hepatic steatosis with morphologic features of liver compatible with cirrhosis. 3. Multiple enlarged lymph nodes within the upper abdomen, nonspecific in the setting of cirrhosis. 4. Mild splenomegaly. Electronically Signed   By: TKerby MoorsM.D.   On: 12/25/2021 09:45   MR 3D Recon At Scanner  Result Date: 12/25/2021 CLINICAL DATA:  Evaluate for choledocholithiasis. EXAM: MRI ABDOMEN WITHOUT AND WITH CONTRAST (INCLUDING MRCP) TECHNIQUE: Multiplanar multisequence MR imaging of the abdomen was performed both before and after the administration of intravenous contrast. Heavily T2-weighted images of the biliary and pancreatic ducts were obtained, and three-dimensional MRCP images were rendered by post processing. CONTRAST:  155mGADAVIST GADOBUTROL 1 MMOL/ML IV SOLN COMPARISON:  CT AP from earlier today FINDINGS: Lower chest: No acute findings. Hepatobiliary: Diffuse hepatic steatosis noted. There is relative hypertrophy of the lateral segment of left hepatic lobe and caudate hepatic lobe. Heterogeneous arterial phase enhancement is identified within the liver which may reflect underlying hepatocellular disease. No suspicious enhancing liver lesions identified. The gallbladder is surgically absent. Increase caliber of the CBD measures up to  8 mm. Filling defect within the distal common bile duct measures 1.4 cm in length with a diameter of 5 mm. No significant intrahepatic bile duct dilatation. Pancreas: No mass, inflammatory changes, or other parenchymal abnormality identified. Spleen: The spleen measures 14.6 cm in length. No focal splenic abnormality.  Adrenals/Urinary Tract: Normal adrenal glands. No signs of kidney mass or hydronephrosis. Stomach/Bowel: Stomach appears normal. There is no pathologic dilatation of the visualized bowel loops. Again noted is diffuse intramural fatty deposition throughout the colon. Vascular/Lymphatic: Normal caliber of the abdominal aorta. Multiple enlarged lymph nodes are identified within the upper abdomen, nonspecific in the setting of cirrhosis. Index peripancreatic node measures 1.9 cm, image 23/4. Index portacaval lymph node measures 1.8 cm, image 26/4. Index gastrohepatic ligament lymph node measures 1.3 cm, image 43/15. Other: Trace perihepatic fluid. No focal fluid collections identified Musculoskeletal: No suspicious bone lesions identified. IMPRESSION: 1. Exam positive for choledocholithiasis. Solitary stone within the distal common bile duct measures 1.4 cm in length with a diameter of 5 mm. 2. Diffuse hepatic steatosis with morphologic features of liver compatible with cirrhosis. 3. Multiple enlarged lymph nodes within the upper abdomen, nonspecific in the setting of cirrhosis. 4. Mild splenomegaly. Electronically Signed   By: Kerby Moors M.D.   On: 12/25/2021 09:45   CT ABDOMEN PELVIS W CONTRAST  Result Date: 12/25/2021 CLINICAL DATA:  Chronic epigastric pain, concern for biliary pathology. EXAM: CT ABDOMEN AND PELVIS WITH CONTRAST TECHNIQUE: Multidetector CT imaging of the abdomen and pelvis was performed using the standard protocol following bolus administration of intravenous contrast. RADIATION DOSE REDUCTION: This exam was performed according to the departmental dose-optimization program which includes automated exposure control, adjustment of the mA and/or kV according to patient size and/or use of iterative reconstruction technique. CONTRAST:  150m OMNIPAQUE IOHEXOL 300 MG/ML  SOLN COMPARISON:  Ultrasound Oct 09, 2021 FINDINGS: Lower chest: No acute abnormality. Hepatobiliary: Hypodense hepatic  parenchyma commonly reflects hepatic steatosis. Hepatic contour nodularity with increased size the caudate lobe likely reflects hepatic steatosis. Gallbladder surgically absent. Small volume pneumobilia on coronal image 65/5. Unchanged prominence of the extrahepatic biliary tree with the common duct measuring 10 mm in maximum diameter. Possible subtle peribiliary enhancement. Pancreas: No pancreatic ductal dilation or evidence of acute inflammation. Spleen: Splenomegaly measuring 15.6 cm in maximum axial dimension. Adrenals/Urinary Tract: Bilateral adrenal glands appear normal. No hydronephrosis. Kidneys demonstrate symmetric enhancement and excretion of contrast material. Urinary bladder is unremarkable for degree of distension. Stomach/Bowel: No radiopaque enteric contrast material was administered. Stomach is minimally distended with ingested material and gas without focal wall thickening. No pathologic dilation of small or large bowel. The appendix and terminal ileum appear normal. Near pancolonic fatty infiltration of the colonic wall worse in the ascending and transverse colon. No evidence of acute bowel inflammation. Descending and sigmoid colonic diverticulosis. Vascular/Lymphatic: Normal caliber abdominal aorta. Enlarged/prominent gastrohepatic ligament, aortocaval hepatoduodenal and retroperitoneal lymph nodes. For reference a gastrohepatic ligament lymph node measures 11 mm in short axis on image 27/2 and a aortocaval lymph node measures 15 mm in short axis on image 37/2. Reproductive: Lobular uterine contour including a masslike excretion from the uterus measuring 8.4 cm reflecting uterine leiomyomas as seen on prior pelvic ultrasound. No suspicious adnexal mass. Other: No significant abdominopelvic free fluid. Musculoskeletal: No acute osseous abnormality. IMPRESSION: 1. Unchanged prominence of the extrahepatic biliary tree now with subtle peribiliary enhancement and a small volume pneumobilia, the  prominence of the biliary tree is at least somewhat related to prior cholecystectomy,  with the additional findings possibly reflecting superimposed sequela of a recently passed choledocholithiasis. However, cholangitis is a pertinent differential consideration. Suggest correlation with laboratory values and if clinically indicated further assessment by MRCP with and without contrast. 2. Cirrhotic hepatic morphology with hepatic steatosis and sequela of portal venous hypertension. Electronically Signed   By: Dahlia Bailiff M.D.   On: 12/25/2021 08:19      Subjective: Patient reports she feels well, tolerating diet without complaint Reports is ready for discharge home  Discharge Exam: Vitals:   12/29/21 0555 12/29/21 0848  BP: 121/64 122/77  Pulse: 81 65  Resp: 17 16  Temp: 98.1 F (36.7 C) 98.6 F (37 C)  SpO2: 100% 98%   Vitals:   12/28/21 1617 12/28/21 2018 12/29/21 0555 12/29/21 0848  BP: (!) 135/107 120/85 121/64 122/77  Pulse: 71 71 81 65  Resp: '16 17 17 16  '$ Temp: 98.4 F (36.9 C) 98.7 F (37.1 C) 98.1 F (36.7 C) 98.6 F (37 C)  TempSrc: Oral Oral Oral Oral  SpO2: 99% 97% 100% 98%  Weight:      Height:        General: Pt is alert, awake, not in acute distress Cardiovascular: RRR, S1/S2 +, no rubs, no gallops Respiratory: CTA bilaterally, no wheezing, no rhonchi Abdominal: Soft, NT, ND, bowel sounds + Extremities: no edema, no cyanosis    The results of significant diagnostics from this hospitalization (including imaging, microbiology, ancillary and laboratory) are listed below for reference.     Microbiology: Recent Results (from the past 240 hour(s))  Blood culture (routine x 2)     Status: None (Preliminary result)   Collection Time: 12/25/21 11:32 AM   Specimen: BLOOD  Result Value Ref Range Status   Specimen Description   Final    BLOOD LEFT ANTECUBITAL Performed at [facility], [place] [address], [place], [place] [address]    Special Requests    Final    BOTTLES DRAWN AEROBIC AND ANAEROBIC Blood Culture results may not be optimal due to an excessive volume of blood received in culture bottles   Culture   Final    NO GROWTH 4 DAYS Performed at [facility], [address] [place], [place] [address]    Report Status PENDING  Incomplete  Blood culture (routine x 2)     Status: Abnormal   Collection Time: 12/25/21 11:32 AM   Specimen: BLOOD  Result Value Ref Range Status   Specimen Description   Final    BLOOD RIGHT ANTECUBITAL Performed at [facility], [place] [address] [place], [place] [address]    Special Requests   Final    BOTTLES DRAWN AEROBIC AND ANAEROBIC Blood Culture adequate volume Performed at [facility], [address] [place], [place] [address]    Culture  Setup Time   Final    GRAM POSITIVE COCCI ANAEROBIC BOTTLE Gram Stain Report Called to,Read Back By and Verified With: JOHNSON,NEIL'@0500'$  BY MATTHEWS,B 8.3.2023 Organism ID to follow CRITICAL RESULT CALLED TO, READ BACK BY AND VERIFIED WITH: Jule Economy [id number] MLM Performed at [facility], [place] [address], [place],  [address]    Culture ENTEROCOCCUS FAECIUM (A)  Final   Report Status 12/28/2021 FINAL  Final   Organism ID, Bacteria ENTEROCOCCUS FAECIUM  Final      Susceptibility   Enterococcus faecium - MIC*    AMPICILLIN <=2 SENSITIVE Sensitive     VANCOMYCIN <=0.5 SENSITIVE Sensitive  GENTAMICIN SYNERGY SENSITIVE Sensitive     * ENTEROCOCCUS FAECIUM  Blood Culture ID Panel (Reflexed)     Status: Abnormal   Collection Time: 12/25/21 11:32 AM  Result Value Ref Range Status   Enterococcus faecalis NOT DETECTED NOT DETECTED Final   Enterococcus Faecium DETECTED (A) NOT DETECTED Final    Comment: CRITICAL RESULT CALLED TO, READ BACK BY AND VERIFIED WITH: PHARMD AUSTIN P [id number] MLM    Listeria monocytogenes NOT DETECTED NOT DETECTED Final   Staphylococcus species NOT DETECTED NOT DETECTED Final   Staphylococcus aureus  (BCID) NOT DETECTED NOT DETECTED Final   Staphylococcus epidermidis NOT DETECTED NOT DETECTED Final   Staphylococcus lugdunensis NOT DETECTED NOT DETECTED Final   Streptococcus species NOT DETECTED NOT DETECTED Final   Streptococcus agalactiae NOT DETECTED NOT DETECTED Final   Streptococcus pneumoniae NOT DETECTED NOT DETECTED Final   Streptococcus pyogenes NOT DETECTED NOT DETECTED Final   A.calcoaceticus-baumannii NOT DETECTED NOT DETECTED Final   Bacteroides fragilis NOT DETECTED NOT DETECTED Final   Enterobacterales NOT DETECTED NOT DETECTED Final   Enterobacter cloacae complex NOT DETECTED NOT DETECTED Final   Escherichia coli NOT DETECTED NOT DETECTED Final   Klebsiella aerogenes NOT DETECTED NOT DETECTED Final   Klebsiella oxytoca NOT DETECTED NOT DETECTED Final   Klebsiella pneumoniae NOT DETECTED NOT DETECTED Final   Proteus species NOT DETECTED NOT DETECTED Final   Salmonella species NOT DETECTED NOT DETECTED Final   Serratia marcescens NOT DETECTED NOT DETECTED Final   Haemophilus influenzae NOT DETECTED NOT DETECTED Final   Neisseria meningitidis NOT DETECTED NOT DETECTED Final   Pseudomonas aeruginosa NOT DETECTED NOT DETECTED Final   Stenotrophomonas maltophilia NOT DETECTED NOT DETECTED Final   Candida albicans NOT DETECTED NOT DETECTED Final   Candida auris NOT DETECTED NOT DETECTED Final   Candida glabrata NOT DETECTED NOT DETECTED Final   Candida krusei NOT DETECTED NOT DETECTED Final   Candida parapsilosis NOT DETECTED NOT DETECTED Final   Candida tropicalis NOT DETECTED NOT DETECTED Final   Cryptococcus neoformans/gattii NOT DETECTED NOT DETECTED Final   Vancomycin resistance NOT DETECTED NOT DETECTED Final    Comment: Performed at [facility], [address], [place], [place] [address]  Culture, blood (Routine X 2) w Reflex to ID Panel     Status: None (Preliminary result)   Collection Time: 12/26/21  2:11 PM   Specimen: BLOOD LEFT HAND  Result Value Ref  Range Status   Specimen Description BLOOD LEFT HAND  Final   Special Requests   Final    BOTTLES DRAWN AEROBIC AND ANAEROBIC Blood Culture adequate volume   Culture   Final    NO GROWTH 3 DAYS Performed at [facility], [place] [address] [place], [place] [address]    Report Status PENDING  Incomplete  Culture, blood (Routine X 2) w Reflex to ID Panel     Status: None (Preliminary result)   Collection Time: 12/26/21  2:14 PM   Specimen: BLOOD  Result Value Ref Range Status   Specimen Description BLOOD RIGHT ANTECUBITAL  Final   Special Requests   Final    BOTTLES DRAWN AEROBIC AND ANAEROBIC Blood Culture adequate volume   Culture   Final    NO GROWTH 3 DAYS Performed at [facility] Lab, [place] [address], [place], [place] [address]    Report Status PENDING  Incomplete  Culture, blood (Routine X 2) w Reflex to ID Panel     Status: None (Preliminary  result)   Collection Time: 12/27/21  5:57 AM   Specimen: BLOOD RIGHT HAND  Result Value Ref Range Status   Specimen Description BLOOD RIGHT HAND  Final   Special Requests   Final    BOTTLES DRAWN AEROBIC AND ANAEROBIC Blood Culture adequate volume   Culture   Final    NO GROWTH 2 DAYS Performed at [facility], [address], [place], [place] [address]    Report Status PENDING  Incomplete  Culture, blood (Routine X 2) w Reflex to ID Panel     Status: None (Preliminary result)   Collection Time: 12/27/21  6:10 AM   Specimen: BLOOD  Result Value Ref Range Status   Specimen Description BLOOD LEFT ANTECUBITAL  Final   Special Requests   Final    BOTTLES DRAWN AEROBIC AND ANAEROBIC Blood Culture results may not be optimal due to an inadequate volume of blood received in culture bottles   Culture   Final    NO GROWTH 2 DAYS Performed at [facility], [place] [address], [place], [place] [address]    Report Status PENDING  Incomplete     Labs: BNP (last 3 results) No results for input(s): "BNP" in the last 8760  hours. Basic Metabolic Panel: Recent Labs  Lab 12/25/21 0620 12/26/21 0130 12/27/21 0557 12/28/21 0101 12/29/21 0114  NA 142 138 141 140 137  K 4.4 3.9 4.0 3.7 3.3*  CL 108 109 112* 110 108  CO2 '25 24 22 25 23  '$ GLUCOSE 130* 87 95 97 97  BUN 24* 24* '13 13 12  '$ CREATININE 0.91 0.95 0.83 0.98 0.91  CALCIUM 8.6* 8.2* 8.2* 8.1* 8.5*  MG  --  2.1  --   --   --   PHOS  --  2.6  --   --   --    Liver Function Tests: Recent Labs  Lab 12/25/21 0620 12/26/21 0130 12/27/21 0557 12/28/21 0101  AST 98* 55* 30 30  ALT 97* 86* 64* 58*  ALKPHOS 141* 107 94 98  BILITOT 6.0* 4.1* 1.6* 1.7*  PROT 7.3 5.6* 5.6* 5.6*  ALBUMIN 3.8 3.0* 2.9* 2.9*   Recent Labs  Lab 12/25/21 0620  LIPASE 41   No results for input(s): "AMMONIA" in the last 168 hours. CBC: Recent Labs  Lab 12/25/21 0620 12/26/21 0130 12/27/21 0557 12/28/21 0101  WBC 9.8 10.9* 8.4 6.5  NEUTROABS 8.8*  --   --   --   HGB 11.7* 8.8* 8.0* 8.3*  HCT 35.6* 26.2* 24.7* 24.7*  MCV 104.7* 102.3* 106.5* 105.6*  PLT 258 203 175 153   Cardiac Enzymes: No results for input(s): "CKTOTAL", "CKMB", "CKMBINDEX", "TROPONINI" in the last 168 hours. BNP: Invalid input(s): "POCBNP" CBG: No results for input(s): "GLUCAP" in the last 168 hours. D-Dimer No results for input(s): "DDIMER" in the last 72 hours. Hgb A1c No results for input(s): "HGBA1C" in the last 72 hours. Lipid Profile No results for input(s): "CHOL", "HDL", "LDLCALC", "TRIG", "CHOLHDL", "LDLDIRECT" in the last 72 hours. Thyroid function studies No results for input(s): "TSH", "T4TOTAL", "T3FREE", "THYROIDAB" in the last 72 hours.  Invalid input(s): "FREET3" Anemia work up No results for input(s): "VITAMINB12", "FOLATE", "FERRITIN", "TIBC", "IRON", "RETICCTPCT" in the last 72 hours. Urinalysis    Component Value Date/Time   COLORURINE ORANGE (A) 12/27/2010 0443   APPEARANCEUR CLEAR 12/27/2010 0443   LABSPEC <1.005 (L) 12/27/2010 0443   PHURINE 6.5 12/27/2010  [place] 12/27/2010 0443  HGBUR NEGATIVE 12/27/2010 0443   BILIRUBINUR SMALL (A) 12/27/2010 0443   KETONESUR NEGATIVE 12/27/2010 0443   PROTEINUR NEGATIVE 12/27/2010 0443   UROBILINOGEN 1.0 12/27/2010 0443   NITRITE NEGATIVE 12/27/2010 0443   LEUKOCYTESUR NEGATIVE 12/27/2010 0443   Sepsis Labs Recent Labs  Lab 12/25/21 0620 12/26/21 0130 12/27/21 0557 12/28/21 0101  WBC 9.8 10.9* 8.4 6.5   Microbiology Recent Results (from the past 240 hour(s))  Blood culture (routine x 2)     Status: None (Preliminary result)   Collection Time: 12/25/21 11:32 AM   Specimen: BLOOD  Result Value Ref Range Status   Specimen Description   Final    BLOOD LEFT ANTECUBITAL Performed at [facility], [place] [address] [place], [place] [address]    Special Requests   Final    BOTTLES DRAWN AEROBIC AND ANAEROBIC Blood Culture results may not be optimal due to an excessive volume of blood received in culture bottles   Culture   Final    NO GROWTH 4 DAYS Performed at [facility], [address] [place], [place] [address]    Report Status PENDING  Incomplete  Blood culture (routine x 2)     Status: Abnormal   Collection Time: 12/25/21 11:32 AM   Specimen: BLOOD  Result Value Ref Range Status   Specimen Description   Final    BLOOD RIGHT ANTECUBITAL Performed at [facility], [place] [address] [place], [place] [address]    Special Requests   Final    BOTTLES DRAWN AEROBIC AND ANAEROBIC Blood Culture adequate volume Performed at [facility], [address] [place], [place] [address]    Culture  Setup Time   Final    GRAM POSITIVE COCCI ANAEROBIC BOTTLE Gram Stain Report Called to,Read Back By and Verified With: JOHNSON,NEIL'@0500'$  BY MATTHEWS,B 8.3.2023 Organism ID to follow CRITICAL RESULT CALLED TO, READ BACK BY AND VERIFIED WITH: Jule Economy [id number] MLM Performed at [facility], [place] [address], [place], [place] [address]    Culture ENTEROCOCCUS  FAECIUM (A)  Final   Report Status 12/28/2021 FINAL  Final   Organism ID, Bacteria ENTEROCOCCUS FAECIUM  Final      Susceptibility   Enterococcus faecium - MIC*    AMPICILLIN <=2 SENSITIVE Sensitive     VANCOMYCIN <=0.5 SENSITIVE Sensitive     GENTAMICIN SYNERGY SENSITIVE Sensitive     * ENTEROCOCCUS FAECIUM  Blood Culture ID Panel (Reflexed)     Status: Abnormal   Collection Time: 12/25/21 11:32 AM  Result Value Ref Range Status   Enterococcus faecalis NOT DETECTED NOT DETECTED Final   Enterococcus Faecium DETECTED (A) NOT DETECTED Final    Comment: CRITICAL RESULT CALLED TO, READ BACK BY AND VERIFIED WITH: PHARMD AUSTIN P [id number] MLM    Listeria monocytogenes NOT DETECTED NOT DETECTED Final   Staphylococcus species NOT DETECTED NOT DETECTED Final   Staphylococcus aureus (BCID) NOT DETECTED NOT DETECTED Final   Staphylococcus epidermidis NOT DETECTED NOT DETECTED Final   Staphylococcus lugdunensis NOT DETECTED NOT DETECTED Final   Streptococcus species NOT DETECTED NOT DETECTED Final   Streptococcus agalactiae NOT DETECTED NOT DETECTED Final   Streptococcus pneumoniae NOT DETECTED NOT DETECTED Final   Streptococcus pyogenes NOT DETECTED NOT DETECTED Final   A.calcoaceticus-baumannii NOT DETECTED NOT DETECTED Final   Bacteroides fragilis NOT DETECTED NOT DETECTED Final   Enterobacterales NOT DETECTED NOT DETECTED Final   Enterobacter cloacae complex NOT DETECTED NOT DETECTED Final   Escherichia  coli NOT DETECTED NOT DETECTED Final   Klebsiella aerogenes NOT DETECTED NOT DETECTED Final   Klebsiella oxytoca NOT DETECTED NOT DETECTED Final   Klebsiella pneumoniae NOT DETECTED NOT DETECTED Final   Proteus species NOT DETECTED NOT DETECTED Final   Salmonella species NOT DETECTED NOT DETECTED Final   Serratia marcescens NOT DETECTED NOT DETECTED Final   Haemophilus influenzae NOT DETECTED NOT DETECTED Final   Neisseria meningitidis NOT DETECTED NOT DETECTED Final   Pseudomonas  aeruginosa NOT DETECTED NOT DETECTED Final   Stenotrophomonas maltophilia NOT DETECTED NOT DETECTED Final   Candida albicans NOT DETECTED NOT DETECTED Final   Candida auris NOT DETECTED NOT DETECTED Final   Candida glabrata NOT DETECTED NOT DETECTED Final   Candida krusei NOT DETECTED NOT DETECTED Final   Candida parapsilosis NOT DETECTED NOT DETECTED Final   Candida tropicalis NOT DETECTED NOT DETECTED Final   Cryptococcus neoformans/gattii NOT DETECTED NOT DETECTED Final   Vancomycin resistance NOT DETECTED NOT DETECTED Final    Comment: Performed at [facility], [place] [address], [place], [place] [address]  Culture, blood (Routine X 2) w Reflex to ID Panel     Status: None (Preliminary result)   Collection Time: 12/26/21  2:11 PM   Specimen: BLOOD LEFT HAND  Result Value Ref Range Status   Specimen Description BLOOD LEFT HAND  Final   Special Requests   Final    BOTTLES DRAWN AEROBIC AND ANAEROBIC Blood Culture adequate volume   Culture   Final    NO GROWTH 3 DAYS Performed at [facility], [place] [address], [place], [place] [address]    Report Status PENDING  Incomplete  Culture, blood (Routine X 2) w Reflex to ID Panel     Status: None (Preliminary result)   Collection Time: 12/26/21  2:14 PM   Specimen: BLOOD  Result Value Ref Range Status   Specimen Description BLOOD RIGHT ANTECUBITAL  Final   Special Requests   Final    BOTTLES DRAWN AEROBIC AND ANAEROBIC Blood Culture adequate volume   Culture   Final    NO GROWTH 3 DAYS Performed at [facility], [place] [address] [place], [place] [address]    Report Status PENDING  Incomplete  Culture, blood (Routine X 2) w Reflex to ID Panel     Status: None (Preliminary result)   Collection Time: 12/27/21  5:57 AM   Specimen: BLOOD RIGHT HAND  Result Value Ref Range Status   Specimen Description BLOOD RIGHT HAND  Final   Special Requests   Final    BOTTLES DRAWN AEROBIC AND ANAEROBIC Blood Culture adequate volume    Culture   Final    NO GROWTH 2 DAYS Performed at [facility], [place] [address] [place], [place] [address]    Report Status PENDING  Incomplete  Culture, blood (Routine X 2) w Reflex to ID Panel     Status: None (Preliminary result)   Collection Time: 12/27/21  6:10 AM   Specimen: BLOOD  Result Value Ref Range Status   Specimen Description BLOOD LEFT ANTECUBITAL  Final   Special Requests   Final    BOTTLES DRAWN AEROBIC AND ANAEROBIC Blood Culture results may not be optimal due to an inadequate volume of blood received in culture bottles   Culture   Final    NO GROWTH 2 DAYS Performed at [facility], [place] [address] [place], [place] [address]    Report Status PENDING  Incomplete     Time coordinating discharge: Over 30 minutes  SIGNED:   Nicolette Bang, MD  [facility] 12/29/2021, 11:50 AM Pager   If 7PM-7AM, please contact night-coverage [email or web address] Password [email or web address]

## 2021-12-29 NOTE — Progress Notes (Signed)
[place] ANTIBIOTIC CONSULT NOTE   Cynthia Donaldson is an 66 y.o. female who presented to [facility] on 12/25/2021 with a chief complaint of abdominal pain, n/v with elevated LFTs and evidence of choledocholithiasis. PMH significant for primary biliary cholangitis. Underwent ERCP 12/26/2021- filling defect c/w stone noted. Complete removal was accomplished by biliary sphincterotomy. Blood cultures on 8/3 and 8/4 have remained negative. May transition to either linezolid 600 mg po bid or high dose amoxicillin 1gm TID to complete 14 day course of therapy pending sensitivities per ID.  8/6: Scr 0.91 (stable), WBC 6.5  Vital Signs: VSS, afebrile   Estimated Creatinine Clearance: 80.2 mL/min (by C-G formula based on SCr of 0.91 mg/dL).  Plan: Continue Vancomycin 1,500 mg IV Q24H (eAUC 486.4, Scr used 0.91, Vd 0.5) Monitor renal function, clinical status, de-escalation, C/S, levels as indicated   Allergies:  Allergies  Allergen Reactions   Codeine Nausea And Vomiting    Filed Weights   12/25/21 0527  Weight: 106.6 kg (235 lb)       Latest Ref Rng & Units 12/28/2021    1:01 AM 12/27/2021    5:57 AM 12/26/2021    1:30 AM  CBC  WBC 4.0 - 10.5 K/uL 6.5  8.4  10.9   Hemoglobin 12.0 - 15.0 g/dL 8.3  8.0  8.8   Hematocrit 36.0 - 46.0 % 24.7  24.7  26.2   Platelets 150 - 400 K/uL 153  175  203     Antibiotics Given (last 72 hours)     Date/Time Action Medication Dose Rate   12/26/21 1040 New Bag/Given   cefTRIAXone (ROCEPHIN) 2 g in sodium chloride 0.9 % 100 mL IVPB 2 g    12/26/21 1426 New Bag/Given   vancomycin (VANCOCIN) 2,500 mg in sodium chloride 0.9 % 500 mL IVPB 2,500 mg 262.5 mL/hr   12/26/21 2235 New Bag/Given   metroNIDAZOLE (FLAGYL) IVPB 500 mg 500 mg 100 mL/hr   12/27/21 4827 New Bag/Given   cefTRIAXone (ROCEPHIN) 2 g in sodium chloride 0.9 % 100 mL IVPB 2 g 200 mL/hr   12/27/21 1009 New Bag/Given   metroNIDAZOLE (FLAGYL) IVPB 500 mg 500 mg 100 mL/hr   12/27/21 1326 New Bag/Given    vancomycin (VANCOREADY) IVPB 1500 mg/300 mL 1,500 mg 150 mL/hr   12/28/21 1356 New Bag/Given   vancomycin (VANCOREADY) IVPB 1500 mg/300 mL 1,500 mg 150 mL/hr       Antimicrobials this admission: Ceftriaxone 8/3 >> 8/4 Flagyl 8/3 >> 8/4 Vancomycin 8/3 >>   Microbiology results: 8/2 Bcx: 1/4 bottles GPCs>>Enterococcus faecium w/ no resistance mechanisms on BCID  8/3 and 8/4 Bcx: NGTD  Thank you for allowing pharmacy to be a part of this patient's care.  Varney Daily, PharmD PGY2 Pharmacy Resident  Please check AMION for all [facility] pharmacy phone numbers After 10:00 PM call main pharmacy [phone]

## 2021-12-29 NOTE — Progress Notes (Signed)
ID PROGRESS NOTE   Sensitivities on e.faecium bacteremia has returned to being amp S.   Plan to switch her to high dose amoxicillin 1gm TID through 8/16 to complete 14 day course of therapy using 8/3 as day 1 of clearance  Domenica Weightman B. [place] [facility] [phone]

## 2021-12-30 ENCOUNTER — Encounter: Payer: Self-pay | Admitting: Gastroenterology

## 2021-12-31 ENCOUNTER — Other Ambulatory Visit (HOSPITAL_COMMUNITY): Payer: Self-pay

## 2021-12-31 ENCOUNTER — Other Ambulatory Visit: Payer: Self-pay

## 2021-12-31 DIAGNOSIS — K7581 Nonalcoholic steatohepatitis (NASH): Secondary | ICD-10-CM

## 2021-12-31 DIAGNOSIS — R1013 Epigastric pain: Secondary | ICD-10-CM

## 2021-12-31 DIAGNOSIS — R197 Diarrhea, unspecified: Secondary | ICD-10-CM

## 2021-12-31 DIAGNOSIS — K743 Primary biliary cirrhosis: Secondary | ICD-10-CM

## 2021-12-31 LAB — CULTURE, BLOOD (ROUTINE X 2)
Culture: NO GROWTH
Culture: NO GROWTH
Culture: NO GROWTH
Special Requests: ADEQUATE
Special Requests: ADEQUATE

## 2022-01-01 ENCOUNTER — Other Ambulatory Visit (HOSPITAL_COMMUNITY): Payer: Self-pay

## 2022-01-01 LAB — CULTURE, BLOOD (ROUTINE X 2)
Culture: NO GROWTH
Culture: NO GROWTH
Special Requests: ADEQUATE

## 2022-01-07 DIAGNOSIS — B952 Enterococcus as the cause of diseases classified elsewhere: Secondary | ICD-10-CM | POA: Diagnosis not present

## 2022-01-07 DIAGNOSIS — G4733 Obstructive sleep apnea (adult) (pediatric): Secondary | ICD-10-CM | POA: Diagnosis not present

## 2022-01-07 DIAGNOSIS — K8080 Other cholelithiasis without obstruction: Secondary | ICD-10-CM | POA: Diagnosis not present

## 2022-01-22 ENCOUNTER — Encounter (INDEPENDENT_AMBULATORY_CARE_PROVIDER_SITE_OTHER): Payer: Self-pay | Admitting: Gastroenterology

## 2022-01-22 ENCOUNTER — Ambulatory Visit (INDEPENDENT_AMBULATORY_CARE_PROVIDER_SITE_OTHER): Payer: 59 | Admitting: Gastroenterology

## 2022-01-22 VITALS — BP 119/80 | HR 79 | Temp 98.3°F | Ht 69.0 in | Wt 235.0 lb

## 2022-01-22 DIAGNOSIS — K7581 Nonalcoholic steatohepatitis (NASH): Secondary | ICD-10-CM

## 2022-01-22 DIAGNOSIS — K743 Primary biliary cirrhosis: Secondary | ICD-10-CM | POA: Diagnosis not present

## 2022-01-22 DIAGNOSIS — K5900 Constipation, unspecified: Secondary | ICD-10-CM

## 2022-01-22 DIAGNOSIS — K8050 Calculus of bile duct without cholangitis or cholecystitis without obstruction: Secondary | ICD-10-CM

## 2022-01-22 NOTE — Progress Notes (Signed)
Cynthia Donaldson, M.D. Gastroenterology & Hepatology [facility] [address] [place], [place] [address]  Primary Care Physician: Asencion Noble, MD [address] [place] [address]  I will communicate my assessment and recommendations to the referring MD via EMR.  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  Patient was admitted to the at [facility] 12/25/2021 after presenting abdominal pain and vomiting. She was found to have cholecodoholithiasis in imaging and was transferred to [facility] for further evaluation.  Given marked elevation of liver enzymes she underwent an ERCP by Dr. Rush Landmark on 12/26/2021.  Endoscopic retrograde cholangiopancreatography findings are described below.  Patient was found to have Enterococcus faecium bacteremia for which she was given antibiotic coverage with linezolid 600 mg twice daily for 2 weeks.  During her hospitalization she was also found to have anemia with hemoglobin in the low eights range.  However, her iron stores were within normal limits and she was found to have low folic acid and G95 levels.  She has finished antibiotics.  Most recent labs on 12/28/2021 showed a total bilirubin of 1.7, AST 30, ALT 58, alkaline phosphatase 98 and albumin of 2.9.  Patient started taking Ocaliva 5 mg qday 3 weeks prior to her episode of choledocholithiasis. She is also taking URSO 500 TID.  States after leaving the hospital she was still feeling nauseated and had to take compazine which helped. The nausea is better and is not taking Compazine.   States for the last 3 days she has  presented some lower abdominal cramping and has been passing gas but not too many bowel movements.   She had some itching in her body since she restarted her alcohol endoscopic ultrasound after her recent hospitalization.  Most recent elastography in 09/2021 -  1. Postcholecystectomy with mild dilatation of the common bile duct. 2. Lobular liver with increased echogenicity.   ULTRASOUND HEPATIC ELASTOGRAPHY: Median kPa:  2.5 Diagnostic category:  < or = 5 kPa: high probability of being normal  The patient denies having any nausea, vomiting, fever, chills, hematochezia, melena, hematemesis, abdominal distention, abdominal pain, diarrhea, jaundice.  Last EGD: 2019 - Normal esophagus. - Z-line irregular, 39 cm from the incisors. Biopsied. - A single gastric polyp. Biopsied. - Erosive gastropathy. - Normal cardia, gastric fundus, gastric body and pylorus. - Normal duodenal bulb and second portion of the duodenum.  Last Colonoscopy: 11/2017 - Two small polyps at the splenic flexure and in the transverse colon. Biopsied. - Diverticulosis in the sigmoid colon. - External hemorrhoids.  Path 1. Stomach, polyp(s) - HYPERPLASTIC GASTRIC POLYP - NO HIGH GRADE DYSPLASIA OR MALIGNANCY IDENTIFIED 2. Esophagogastric junction, biopsy - SQUAMOCOLUMNAR JUNCTION WITH CHRONIC INFLAMMATION - NO INTESTINAL METAPLASIA, DYSPLASIA OR MALIGNANCY IDENTIFIED 3. Colon, polyp(s), transverse and splenic flexure - POLYPOID FRAGMENTS OF COLONIC MUCOSA WITH LYMPHOID AGGREGATE(S) - NO HIGH GRADE DYSPLASIA OR MALIGNANCY IDENTIFIED - SEE COMMEN  Recommended repeat colonoscopy in 5 years  Last ERCP: - No gross lesions in esophagus. Z-line irregular, 40 cm from the incisors. No varices. - 2 cm hiatal hernia. - Enlarged antral gastric folds. Erythematous mucosa in the stomach. HP biopsies obtained. - No gross lesions in the duodenal bulb, in the first portion of the duodenum and  in the second portion of the  duodenum. - Prior biliary sphincterotomy appeared open but was only about 4 mm in length. - A filling defect consistent with a stone was seen on the cholangiogram. - The entire main bile duct was moderately dilated. - Choledocholithiasis was found. Complete removal was accomplished by biliary sphincterotomy extension and balloon DASE sphincteroplasty and balloon trawl.  Past Medical History: Past Medical History:  Diagnosis Date   Bile duct stenosis    Hyperlipidemia    Hypothyroidism    PONV (postoperative nausea and vomiting)    Sleep apnea     Past Surgical History: Past Surgical History:  Procedure Laterality Date   anal fissure repair     BILIARY DILATION  12/26/2021   Procedure: BILIARY DILATION;  Surgeon: Irving Copas., MD;  Location: [place];  Service: Gastroenterology;;   BIOPSY  12/26/2021   Procedure: BIOPSY;  Surgeon: Irving Copas., MD;  Location: [facility] ENDOSCOPY;  Service: Gastroenterology;;   CHOLECYSTECTOMY     COLONOSCOPY N/A 12/02/2017   Procedure: COLONOSCOPY;  Surgeon: Rogene Houston, MD;  [facility];  Service: Endoscopy;  Laterality: N/A;  1030   ERCP      X3   ERCP N/A 12/26/2021   Procedure: ENDOSCOPIC RETROGRADE CHOLANGIOPANCREATOGRAPHY (ERCP);  Surgeon: Irving Copas., MD;  Location: [place];  Service: Gastroenterology;  Laterality: N/A;   ESOPHAGOGASTRODUODENOSCOPY N/A 12/26/2021   Procedure: ESOPHAGOGASTRODUODENOSCOPY (EGD);  Surgeon: Irving Copas., MD;  Location: [place];  Service: Gastroenterology;  Laterality: N/A;   MYOMECTOMY     REMOVAL OF STONES  12/26/2021   Procedure: REMOVAL OF STONES;  Surgeon: Mansouraty, Telford Nab., MD;  Location: [place];  Service: Gastroenterology;;   Joan Mayans  12/26/2021   Procedure: Joan Mayans;  Surgeon: Mansouraty, Telford Nab., MD;  Location: [facility] ENDOSCOPY;  Service: Gastroenterology;;   TONSILLECTOMY      Family History: Family History  Problem  Relation Age of Onset   Heart disease Other    Arthritis Other    Cancer Other    Hypertension Father    Cancer Father        stomach   Heart disease Mother    Colon cancer Brother    Suicidality Sister    Suicidality Brother    Hypertension Brother    Arthritis Brother    Hypertension Brother    Arthritis Brother    Hypertension Brother    Arthritis Brother     Social History: Social History   Tobacco Use  Smoking Status Former   Types: Cigarettes   Quit date: 2022   Years since quitting: 1.6  Smokeless Tobacco Never  Tobacco Comments   07/2019   Social History   Substance and Sexual Activity  Alcohol Use Yes   Comment: monthly   Social History   Substance and Sexual Activity  Drug Use No    Allergies: Allergies  Allergen Reactions   Codeine Nausea And Vomiting    Medications: Current Outpatient Medications  Medication Sig Dispense Refill   buPROPion (WELLBUTRIN XL) 150 MG 24 hr tablet TAKE 3 TABLETS BY MOUTH ONCE A DAY 270 tablet 4   dicyclomine (BENTYL) 10 MG capsule Take 1 capsule (10 mg total) by mouth 2 (two) times daily before a meal. (Patient taking differently: Take 10 mg by mouth 2 (two) times daily as needed for spasms.) 60 capsule 5   levothyroxine (SYNTHROID) 150 MCG tablet TAKE 1 TABLET BY MOUTH ONCE DAILY 90 tablet 4   neomycin-polymyxin-hydrocortisone (CORTISPORIN)  3.5-10000-1 OTIC suspension Place 4 drops into the left ear 4 (four) times daily. X 7 days 10 mL 1   Obeticholic Acid (OCALIVA) 5 MG TABS Take 1 tablet by mouth daily. (Patient taking differently: Take 5 mg by mouth at bedtime.) 30 tablet 2   OVER THE COUNTER MEDICATION Folic Acid 6378 mcg once per day.     OVER THE COUNTER MEDICATION Vit B 12 1000 mcg once per day.     triamcinolone ointment (KENALOG) 0.5 % Apply 1 application topically 2 (two) times daily. 30 g 3   ursodiol (ACTIGALL) 500 MG tablet Take 1 tablet (500 mg total) by mouth 3 (three) times daily. 270 tablet 4    Current Facility-Administered Medications  Medication Dose Route Frequency Provider Last Rate Last Admin   methylPREDNISolone acetate (DEPO-MEDROL) injection 40 mg  40 mg Intra-articular Once Carole Civil, MD        Review of Systems: GENERAL: negative for malaise, night sweats HEENT: No changes in hearing or vision, no nose bleeds or other nasal problems. NECK: Negative for lumps, goiter, pain and significant neck swelling RESPIRATORY: Negative for cough, wheezing CARDIOVASCULAR: Negative for chest pain, leg swelling, palpitations, orthopnea GI: SEE HPI MUSCULOSKELETAL: Negative for joint pain or swelling, back pain, and muscle pain. SKIN: Negative for lesions, rash PSYCH: Negative for sleep disturbance, mood disorder and recent psychosocial stressors. HEMATOLOGY Negative for prolonged bleeding, bruising easily, and swollen nodes. ENDOCRINE: Negative for cold or heat intolerance, polyuria, polydipsia and goiter. NEURO: negative for tremor, gait imbalance, syncope and seizures. The remainder of the review of systems is noncontributory.   Physical Exam: BP 119/80 (BP Location: Left Arm, Patient Position: Sitting, Cuff Size: Large)   Pulse 79   Temp 98.3 F (36.8 C) (Oral)   Ht '5\' 9"'$  (1.753 m)   Wt 235 lb (106.6 kg)   BMI 34.70 kg/m  GENERAL: The patient is AO x3, in no acute distress. HEENT: Head is normocephalic and atraumatic. EOMI are intact. Mouth is well hydrated and without lesions. NECK: Supple. No masses LUNGS: Clear to auscultation. No presence of rhonchi/wheezing/rales. Adequate chest expansion HEART: RRR, normal s1 and s2. ABDOMEN: Soft, nontender, no guarding, no peritoneal signs, and nondistended. BS +. No masses. EXTREMITIES: Without any cyanosis, clubbing, rash, lesions or edema. NEUROLOGIC: AOx3, no focal motor deficit. SKIN: no jaundice, no rashes  Imaging/Labs: as above  I personally reviewed and interpreted the available labs, imaging and  endoscopic files.  Impression and Plan: Cynthia Donaldson is a 66 year old female with history of Karlene Lineman, obesity, depression, hypothyroidism, primary biliary cholangitis and recurrent choledocholithiasis, who presents for follow up after recent hospitalization for cholangitis and choledocholithiasis.  The patient had an episode of obstructive cholangitis due to Enterococcus faecium due to choledocholithiasis.  This was treated effectively with choledocholithiasis and responded to antibiotic treatment with linezolid.  She is currently asymptomatic and has not presented any other complaints, presenting significant improvement of her symptoms after discharge from the hospital.  At this moment, she should continue taking the URSO and Ocaliva 5 mg every day for management of her PBC.  It seems that she had an adequate response in terms of her alkaline phosphatase as this dropped down significantly.  It remains to be seen if her bilirubin will also improve with this medication.  She is due for repeat labs in October 2023 which will be collected by her PCP.  Reassuringly, her most recent elastography was within normal limits, will need to repeat this test  in 3 years.  In terms of her new onset constipation, I advised her to take MiraLAX daily to improve her bowel movement frequency.  - Continue ursodiol 500 mg three times a day - Continue Ocaliva 5 mg qday - Repeat blood workup in October 2023 - Can start Miralax 1/2 capful every day for constipation  All questions were answered.      Harvel Quale, MD Gastroenterology and Hepatology [facility]

## 2022-01-22 NOTE — Patient Instructions (Signed)
Continue ursodiol 500 mg three times a day Continue Ocaliva 5 mg qday Repeat blood workup in October 2023 Can start Miralax 1/2 capful every day for constipation

## 2022-02-17 ENCOUNTER — Other Ambulatory Visit (INDEPENDENT_AMBULATORY_CARE_PROVIDER_SITE_OTHER): Payer: Self-pay | Admitting: Internal Medicine

## 2022-02-17 ENCOUNTER — Other Ambulatory Visit (HOSPITAL_COMMUNITY): Payer: Self-pay

## 2022-02-19 ENCOUNTER — Other Ambulatory Visit (HOSPITAL_COMMUNITY): Payer: Self-pay

## 2022-02-20 ENCOUNTER — Other Ambulatory Visit (HOSPITAL_COMMUNITY): Payer: Self-pay

## 2022-02-28 ENCOUNTER — Other Ambulatory Visit (INDEPENDENT_AMBULATORY_CARE_PROVIDER_SITE_OTHER): Payer: Self-pay | Admitting: *Deleted

## 2022-02-28 DIAGNOSIS — K743 Primary biliary cirrhosis: Secondary | ICD-10-CM

## 2022-03-07 DIAGNOSIS — K743 Primary biliary cirrhosis: Secondary | ICD-10-CM | POA: Diagnosis not present

## 2022-03-08 ENCOUNTER — Other Ambulatory Visit (HOSPITAL_COMMUNITY): Payer: Self-pay

## 2022-03-08 LAB — COMPREHENSIVE METABOLIC PANEL
ALT: 53 IU/L — ABNORMAL HIGH (ref 0–32)
AST: 41 IU/L — ABNORMAL HIGH (ref 0–40)
Albumin/Globulin Ratio: 1.8 (ref 1.2–2.2)
Albumin: 4.4 g/dL (ref 3.9–4.9)
Alkaline Phosphatase: 142 IU/L — ABNORMAL HIGH (ref 44–121)
BUN/Creatinine Ratio: 14 (ref 12–28)
BUN: 13 mg/dL (ref 8–27)
Bilirubin Total: 0.5 mg/dL (ref 0.0–1.2)
CO2: 22 mmol/L (ref 20–29)
Calcium: 9.5 mg/dL (ref 8.7–10.3)
Chloride: 105 mmol/L (ref 96–106)
Creatinine, Ser: 0.96 mg/dL (ref 0.57–1.00)
Globulin, Total: 2.5 g/dL (ref 1.5–4.5)
Glucose: 93 mg/dL (ref 70–99)
Potassium: 4.4 mmol/L (ref 3.5–5.2)
Sodium: 141 mmol/L (ref 134–144)
Total Protein: 6.9 g/dL (ref 6.0–8.5)
eGFR: 65 mL/min/{1.73_m2} (ref >59)

## 2022-03-08 LAB — CBC WITH DIFFERENTIAL/PLATELET
Basophils Absolute: 0.1 10*3/uL (ref 0.0–0.2)
Basos: 1 %
EOS (ABSOLUTE): 0.1 10*3/uL (ref 0.0–0.4)
Eos: 2 %
Hematocrit: 41 % (ref 34.0–46.6)
Hemoglobin: 13.8 g/dL (ref 11.1–15.9)
Immature Grans (Abs): 0 10*3/uL (ref 0.0–0.1)
Immature Granulocytes: 0 %
Lymphocytes Absolute: 1.4 10*3/uL (ref 0.7–3.1)
Lymphs: 20 %
MCH: 31.8 pg (ref 26.6–33.0)
MCHC: 33.7 g/dL (ref 31.5–35.7)
MCV: 95 fL (ref 79–97)
Monocytes Absolute: 0.4 10*3/uL (ref 0.1–0.9)
Monocytes: 6 %
Neutrophils Absolute: 4.9 10*3/uL (ref 1.4–7.0)
Neutrophils: 71 %
Platelets: 224 10*3/uL (ref 150–450)
RBC: 4.34 x10E6/uL (ref 3.77–5.28)
RDW: 11.4 % — ABNORMAL LOW (ref 11.7–15.4)
WBC: 6.9 10*3/uL (ref 3.4–10.8)

## 2022-03-13 ENCOUNTER — Other Ambulatory Visit (HOSPITAL_COMMUNITY): Payer: Self-pay

## 2022-04-02 DIAGNOSIS — K743 Primary biliary cirrhosis: Secondary | ICD-10-CM | POA: Diagnosis not present

## 2022-04-02 DIAGNOSIS — Z79899 Other long term (current) drug therapy: Secondary | ICD-10-CM | POA: Diagnosis not present

## 2022-04-02 DIAGNOSIS — K760 Fatty (change of) liver, not elsewhere classified: Secondary | ICD-10-CM | POA: Diagnosis not present

## 2022-04-07 ENCOUNTER — Ambulatory Visit: Payer: 59 | Admitting: Adult Health

## 2022-04-14 ENCOUNTER — Ambulatory Visit: Payer: 59 | Admitting: Adult Health

## 2022-04-14 ENCOUNTER — Encounter: Payer: Self-pay | Admitting: Adult Health

## 2022-04-14 VITALS — BP 111/73 | HR 72 | Ht 69.0 in | Wt 229.5 lb

## 2022-04-14 DIAGNOSIS — L900 Lichen sclerosus et atrophicus: Secondary | ICD-10-CM | POA: Diagnosis not present

## 2022-04-14 DIAGNOSIS — L292 Pruritus vulvae: Secondary | ICD-10-CM | POA: Diagnosis not present

## 2022-04-14 MED ORDER — CLOBETASOL PROPIONATE 0.05 % EX OINT: TOPICAL_OINTMENT | CUTANEOUS | 2 refills | Status: DC

## 2022-04-14 NOTE — Progress Notes (Signed)
  Subjective:     Patient ID: Cynthia Donaldson, female   DOB: Jan 08, 1956, 66 y.o.   MRN: [id number]  HPI Cynthia Donaldson is a 66 year old white female, married, PM in complaining of itching in vulva area on and off, has used Desitin with some relief.     Component Value Date/Time   DIAGPAP  07/04/2021 1213    - Negative for intraepithelial lesion or malignancy (NILM)   HPVHIGH Negative 07/04/2021 1213   ADEQPAP  07/04/2021 1213    Satisfactory for evaluation; transformation zone component ABSENT.   PCP is Dr Willey Blade  Review of Systems Itching in vulva area, esp near clitoris  Reviewed past medical,surgical, social and family history. Reviewed medications and allergies.     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 N/A    Does the patient's partner want to become pregnant in the next year? N/A    Would the patient like to discuss contraceptive options today? N/A      Contraception Wrap Up   Current Method No Method - Other Reason;Abstinence   postmenopausal   End Method Abstinence;No Method - Other Reason   postmenopausal   Contraception Counseling Provided No            Examination chaperoned by Levy Pupa LPN  Assessment:     1. Vulvar itching Will try temovate, for external use only  Meds ordered this encounter  Medications   clobetasol ointment (TEMOVATE) 0.05 %    Sig: Use 2 x daily for 2 weeks then 2-3 weekly    Dispense:  30 g    Refill:  2    Order Specific Question:   Supervising Provider    Answer:   Elonda Husky, LUTHER H [[id number]]      2. Lichen sclerosus et atrophicus Will try temovate Review handout on LS    Plan:     Follow up in 8 weeks for recheck

## 2022-05-09 ENCOUNTER — Other Ambulatory Visit (INDEPENDENT_AMBULATORY_CARE_PROVIDER_SITE_OTHER): Payer: Self-pay | Admitting: Gastroenterology

## 2022-05-14 ENCOUNTER — Other Ambulatory Visit (HOSPITAL_COMMUNITY): Payer: Self-pay

## 2022-05-15 ENCOUNTER — Other Ambulatory Visit (HOSPITAL_COMMUNITY): Payer: Self-pay

## 2022-05-15 ENCOUNTER — Other Ambulatory Visit (INDEPENDENT_AMBULATORY_CARE_PROVIDER_SITE_OTHER): Payer: Self-pay

## 2022-05-15 ENCOUNTER — Encounter (INDEPENDENT_AMBULATORY_CARE_PROVIDER_SITE_OTHER): Payer: Self-pay

## 2022-05-15 DIAGNOSIS — R1013 Epigastric pain: Secondary | ICD-10-CM

## 2022-05-15 DIAGNOSIS — K5900 Constipation, unspecified: Secondary | ICD-10-CM

## 2022-05-15 DIAGNOSIS — R197 Diarrhea, unspecified: Secondary | ICD-10-CM

## 2022-05-15 DIAGNOSIS — E039 Hypothyroidism, unspecified: Secondary | ICD-10-CM

## 2022-05-15 DIAGNOSIS — K743 Primary biliary cirrhosis: Secondary | ICD-10-CM

## 2022-05-15 DIAGNOSIS — K8050 Calculus of bile duct without cholangitis or cholecystitis without obstruction: Secondary | ICD-10-CM

## 2022-05-15 DIAGNOSIS — K7581 Nonalcoholic steatohepatitis (NASH): Secondary | ICD-10-CM

## 2022-05-15 MED ORDER — LEVOTHYROXINE SODIUM 150 MCG PO TABS
150.0000 ug | ORAL_TABLET | Freq: Every day | ORAL | 4 refills | Status: DC
  Filled 2022-05-15: qty 90, 90d supply, fill #0
  Filled 2022-08-11: qty 90, 90d supply, fill #1
  Filled 2022-10-30: qty 90, 90d supply, fill #2
  Filled 2023-02-09: qty 90, 90d supply, fill #3

## 2022-05-15 MED ORDER — BUPROPION HCL ER (XL) 150 MG PO TB24
450.0000 mg | ORAL_TABLET | Freq: Every day | ORAL | 4 refills | Status: DC
  Filled 2022-05-15: qty 270, 90d supply, fill #0
  Filled 2022-10-30: qty 270, 90d supply, fill #1
  Filled 2023-02-09: qty 270, 90d supply, fill #2

## 2022-05-16 DIAGNOSIS — G4733 Obstructive sleep apnea (adult) (pediatric): Secondary | ICD-10-CM | POA: Diagnosis not present

## 2022-05-23 DIAGNOSIS — K743 Primary biliary cirrhosis: Secondary | ICD-10-CM | POA: Diagnosis not present

## 2022-05-23 DIAGNOSIS — K760 Fatty (change of) liver, not elsewhere classified: Secondary | ICD-10-CM | POA: Diagnosis not present

## 2022-05-23 DIAGNOSIS — Z79899 Other long term (current) drug therapy: Secondary | ICD-10-CM | POA: Diagnosis not present

## 2022-05-29 DIAGNOSIS — K743 Primary biliary cirrhosis: Secondary | ICD-10-CM | POA: Diagnosis not present

## 2022-05-29 DIAGNOSIS — Z23 Encounter for immunization: Secondary | ICD-10-CM | POA: Diagnosis not present

## 2022-05-29 DIAGNOSIS — D649 Anemia, unspecified: Secondary | ICD-10-CM | POA: Diagnosis not present

## 2022-06-03 ENCOUNTER — Telehealth (INDEPENDENT_AMBULATORY_CARE_PROVIDER_SITE_OTHER): Payer: Self-pay | Admitting: *Deleted

## 2022-06-03 NOTE — Telephone Encounter (Signed)
Patient had labs done at pcp office and copy of labs ( liver, cbc with diff, vit b12 and folate) in your office for review.

## 2022-06-03 NOTE — Telephone Encounter (Signed)
Thanks, will take a look tomorrow

## 2022-06-06 ENCOUNTER — Telehealth (INDEPENDENT_AMBULATORY_CARE_PROVIDER_SITE_OTHER): Payer: Self-pay | Admitting: Gastroenterology

## 2022-06-06 NOTE — Telephone Encounter (Signed)
Please let the patient know that I received her most recent blood workup from 05/14/2022 which showed normal total bilirubin of 0.5 and AST of 28, ALT was very mildly elevated at 44.  However, the alkaline phosphatase was still slightly elevated at 152. We will continue her on ursodiol and Ocaliva 5 mg/day for the next 6 months.  If repeat blood testing does not show improvement of the alkaline phosphatase, we may need to increase the dosage to 10 mg every day.

## 2022-06-09 ENCOUNTER — Encounter: Payer: Self-pay | Admitting: Adult Health

## 2022-06-09 ENCOUNTER — Ambulatory Visit (INDEPENDENT_AMBULATORY_CARE_PROVIDER_SITE_OTHER): Payer: 59 | Admitting: Gastroenterology

## 2022-06-09 ENCOUNTER — Ambulatory Visit: Payer: Commercial Managed Care - PPO | Admitting: Adult Health

## 2022-06-09 VITALS — BP 117/69 | HR 62 | Ht 69.0 in | Wt 233.0 lb

## 2022-06-09 DIAGNOSIS — L900 Lichen sclerosus et atrophicus: Secondary | ICD-10-CM | POA: Diagnosis not present

## 2022-06-09 NOTE — Progress Notes (Signed)
  Subjective:     Patient ID: Cynthia Donaldson, female   DOB: [birth year], 67 y.o.   MRN: [id number]  HPI Cynthia Donaldson is a 67 year old white female, married, PM in for follow up on having vulva itching and LSA, was prescribed temovate in November and is doing good.  Last pap was negative HPV and NILM 07/04/21.  PCP is Dr Willey Blade.  Review of Systems No itching at present Reviewed past medical,surgical, social and family history. Reviewed medications and allergies.     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  Upstream - 06/09/22 1059       Pregnancy Intention Screening   Does the patient want to become pregnant in the next year? No    Does the patient's partner want to become pregnant in the next year? No    Would the patient like to discuss contraceptive options today? No      Contraception Wrap Up   Current Method No Method - Other Reason   pm   Reason for No Current Contraceptive Method at Intake ([facility] Only) --   PM   End Method No Method - Other Reason   PM           Examination chaperoned by Celene Squibb LPN  Assessment:     1. Lichen sclerosus et atrophicus Continue temovate 2 x weekly, has refills      Plan:     Follow up prn

## 2022-06-09 NOTE — Telephone Encounter (Signed)
Patient made aware of all.  

## 2022-06-24 ENCOUNTER — Encounter (INDEPENDENT_AMBULATORY_CARE_PROVIDER_SITE_OTHER): Payer: Self-pay

## 2022-07-07 ENCOUNTER — Other Ambulatory Visit (HOSPITAL_BASED_OUTPATIENT_CLINIC_OR_DEPARTMENT_OTHER): Payer: Self-pay

## 2022-07-07 ENCOUNTER — Other Ambulatory Visit (HOSPITAL_COMMUNITY): Payer: Self-pay

## 2022-07-07 MED ORDER — URSODIOL 500 MG PO TABS
500.0000 mg | ORAL_TABLET | Freq: Three times a day (TID) | ORAL | 4 refills | Status: DC
  Filled 2022-07-07 – 2022-07-21 (×2): qty 270, 90d supply, fill #0
  Filled 2022-10-30: qty 270, 90d supply, fill #1
  Filled 2023-02-04: qty 270, 90d supply, fill #2
  Filled 2023-07-07: qty 270, 90d supply, fill #3

## 2022-07-08 ENCOUNTER — Other Ambulatory Visit (HOSPITAL_COMMUNITY): Payer: Self-pay

## 2022-07-08 ENCOUNTER — Encounter (HOSPITAL_COMMUNITY): Payer: Self-pay

## 2022-07-14 ENCOUNTER — Other Ambulatory Visit: Payer: Self-pay

## 2022-07-21 ENCOUNTER — Other Ambulatory Visit (HOSPITAL_COMMUNITY): Payer: Self-pay

## 2022-07-22 ENCOUNTER — Other Ambulatory Visit (HOSPITAL_COMMUNITY): Payer: Self-pay

## 2022-07-22 ENCOUNTER — Other Ambulatory Visit: Payer: Self-pay

## 2022-07-24 ENCOUNTER — Ambulatory Visit (INDEPENDENT_AMBULATORY_CARE_PROVIDER_SITE_OTHER): Payer: 59 | Admitting: Gastroenterology

## 2022-07-25 ENCOUNTER — Encounter (HOSPITAL_COMMUNITY): Payer: Self-pay

## 2022-07-25 ENCOUNTER — Other Ambulatory Visit (HOSPITAL_COMMUNITY): Payer: Self-pay

## 2022-07-29 ENCOUNTER — Other Ambulatory Visit (INDEPENDENT_AMBULATORY_CARE_PROVIDER_SITE_OTHER): Payer: Self-pay | Admitting: Gastroenterology

## 2022-08-04 ENCOUNTER — Ambulatory Visit (INDEPENDENT_AMBULATORY_CARE_PROVIDER_SITE_OTHER): Payer: Commercial Managed Care - PPO | Admitting: Gastroenterology

## 2022-08-04 ENCOUNTER — Encounter (INDEPENDENT_AMBULATORY_CARE_PROVIDER_SITE_OTHER): Payer: Self-pay | Admitting: Gastroenterology

## 2022-08-04 ENCOUNTER — Other Ambulatory Visit (INDEPENDENT_AMBULATORY_CARE_PROVIDER_SITE_OTHER): Payer: Self-pay | Admitting: Gastroenterology

## 2022-08-04 VITALS — BP 126/87 | HR 70 | Temp 97.1°F | Ht 69.0 in | Wt 237.5 lb

## 2022-08-04 DIAGNOSIS — K7581 Nonalcoholic steatohepatitis (NASH): Secondary | ICD-10-CM | POA: Diagnosis not present

## 2022-08-04 DIAGNOSIS — Z13820 Encounter for screening for osteoporosis: Secondary | ICD-10-CM

## 2022-08-04 DIAGNOSIS — K743 Primary biliary cirrhosis: Secondary | ICD-10-CM

## 2022-08-04 NOTE — Patient Instructions (Addendum)
Continue ursodiol 500 mg every 8 hours Continue Ocaliva 5 mg qday Schedule DEXA scan Will check CBC and CMP in mid June 2024 Repeat colonoscopy in 11/2022

## 2022-08-04 NOTE — Progress Notes (Signed)
Cynthia Donaldson, M.D. Gastroenterology & Hepatology [facility] [address] [place], [place] [address]  Primary Care Physician: Asencion Noble, MD [address] [place] [address]  I will communicate my assessment and recommendations to the referring MD via EMR.  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 female with history of Karlene Lineman, obesity, depression, hypothyroidism, primary biliary cholangitis and recurrent choledocholithiasis, who presents for follow up of PBC and NASH.  The patient was last seen on 01/22/22. At that time, the patient was advised to continue URSO 500 milligrams 3 times daily and Ocaliva 5 mg every day.  She was advised to start taking MiraLAX for constipation.  Most recent labs from 05/14/2022 which showed normal total bilirubin of 0.5 and AST of 28, ALT was very mildly elevated at 44.  However, the alkaline phosphatase was still slightly elevated at 152.   Main complaint today is pruritus intermittently, which is a little worse with [place]. She uses an ointment to ease it.will like to hold off adding any medication unless pruritus gets worse.  The patient denies having any nausea, vomiting, fever, chills, hematochezia, melena, hematemesis, abdominal distention, abdominal pain, diarrhea, jaundice, pruritus.  Gained 2 pounds since the last time she was in clinic  States that she only noticed some eczema in her lower extremities after starting Ocaliva.   Thinks her last DEXA was close to 10 years ago.  Most recent elastography in 09/2021 -  1. Postcholecystectomy with mild dilatation of the common bile duct. 2. Lobular liver with increased echogenicity.   ULTRASOUND HEPATIC ELASTOGRAPHY: Median kPa:  2.5 Diagnostic category:  < or = 5 kPa: high probability of being normal   The patient denies having any nausea,  vomiting, fever, chills, hematochezia, melena, hematemesis, abdominal distention, abdominal pain, diarrhea, jaundice.   Last EGD: 2019 - Normal esophagus. - Z-line irregular, 39 cm from the incisors. Biopsied. - A single gastric polyp. Biopsied. - Erosive gastropathy. - Normal cardia, gastric fundus, gastric body and pylorus. - Normal duodenal bulb and second portion of the duodenum.   Last Colonoscopy: 11/2017 - Two small polyps at the splenic flexure and in the transverse colon. Biopsied. - Diverticulosis in the sigmoid colon. - External hemorrhoids.   Path 1. Stomach, polyp(s) - HYPERPLASTIC GASTRIC POLYP - NO HIGH GRADE DYSPLASIA OR MALIGNANCY IDENTIFIED 2. Esophagogastric junction, biopsy - SQUAMOCOLUMNAR JUNCTION WITH CHRONIC INFLAMMATION - NO INTESTINAL METAPLASIA, DYSPLASIA OR MALIGNANCY IDENTIFIED 3. Colon, polyp(s), transverse and splenic flexure - POLYPOID FRAGMENTS OF COLONIC MUCOSA WITH LYMPHOID AGGREGATE(S) - NO HIGH GRADE DYSPLASIA OR MALIGNANCY IDENTIFIED - SEE COMMEN   Recommended repeat colonoscopy in 5 years  Past Medical History: Past Medical History:  Diagnosis Date   Bile duct stenosis    Hyperlipidemia    Hypothyroidism    PONV (postoperative nausea and vomiting)    Sleep apnea     Past Surgical History: Past Surgical History:  Procedure Laterality Date   anal fissure repair     BILIARY DILATION  12/26/2021   Procedure: BILIARY DILATION;  Surgeon: Irving Copas., MD;  Location: [place];  Service: Gastroenterology;;   BIOPSY  12/26/2021   Procedure: BIOPSY;  Surgeon: Irving Copas., MD;  Location: [facility] ENDOSCOPY;  Service: Gastroenterology;;   CHOLECYSTECTOMY     COLONOSCOPY N/A 12/02/2017   Procedure: COLONOSCOPY;  Surgeon: Rogene Houston, MD;  [facility];  Service: Endoscopy;  Laterality: N/A;  1030   ERCP      X3   ERCP N/A 12/26/2021   Procedure: ENDOSCOPIC RETROGRADE CHOLANGIOPANCREATOGRAPHY (ERCP);  Surgeon:  Irving Copas., MD;  Location: [place];  Service: Gastroenterology;  Laterality: N/A;   ESOPHAGOGASTRODUODENOSCOPY N/A 12/26/2021   Procedure: ESOPHAGOGASTRODUODENOSCOPY (EGD);  Surgeon: Irving Copas., MD;  Location: [place];  Service: Gastroenterology;  Laterality: N/A;   MYOMECTOMY     REMOVAL OF STONES  12/26/2021   Procedure: REMOVAL OF STONES;  Surgeon: Mansouraty, Telford Nab., MD;  Location: [place] [place];  Service: Gastroenterology;;   Joan Mayans  12/26/2021   Procedure: Joan Mayans;  Surgeon: Mansouraty, Telford Nab., MD;  Location: [facility] ENDOSCOPY;  Service: Gastroenterology;;   TONSILLECTOMY      Family History: Family History  Problem Relation Age of Onset   Hypertension Father    Cancer Father        stomach   Heart disease Mother    Colon cancer Brother    Suicidality Brother    Hypertension Brother    Arthritis Brother    Hypertension Brother    Arthritis Brother    Hypertension Brother    Arthritis Brother    Heart attack Brother    Suicidality Sister    Heart disease Other    Arthritis Other    Cancer Other     Social History: Social History   Tobacco Use  Smoking Status Former   Types: Cigarettes   Quit date: 2022   Years since quitting: 2.1  Smokeless Tobacco Never  Tobacco Comments   07/2019   Social History   Substance and Sexual Activity  Alcohol Use Yes   Comment: monthly   Social History   Substance and Sexual Activity  Drug Use No    Allergies: Allergies  Allergen Reactions   Codeine Nausea And Vomiting    Medications: Current Outpatient Medications  Medication Sig Dispense Refill   buPROPion (WELLBUTRIN XL) 150 MG 24 hr tablet Take 3 tablets (450 mg total) by mouth daily. 270 tablet 4   clobetasol ointment (TEMOVATE) 0.05 % Use 2 x daily for 2 weeks then 2-3 weekly 30 g 2   dicyclomine (BENTYL) 10 MG capsule Take 1 capsule (10 mg total) by mouth 2 (two) times daily before a meal. (Patient taking  differently: Take 10 mg by mouth 2 (two) times daily as needed for spasms.) 60 capsule 5   levothyroxine (SYNTHROID) 150 MCG tablet Take 1 tablet (150 mcg total) by mouth daily. 90 tablet 4   neomycin-polymyxin-hydrocortisone (CORTISPORIN) 3.5-10000-1 OTIC suspension Place 4 drops into the left ear 4 (four) times daily. X 7 days 10 mL 1   OCALIVA 5 MG TABS TAKE 1 TABLET DAILY 30 tablet 2   ursodiol (ACTIGALL) 500 MG tablet Take 1 tablet (500 mg total) by mouth 3 (three) times daily. 270 tablet 4   Current Facility-Administered Medications  Medication Dose Route Frequency Provider Last Rate Last Admin   methylPREDNISolone acetate (DEPO-MEDROL) injection 40 mg  40 mg Intra-articular Once Carole Civil, MD        Review of Systems: GENERAL: negative for malaise, night sweats HEENT: No changes in hearing or vision, no nose bleeds or other nasal problems. NECK: Negative for lumps, goiter, pain and significant neck swelling RESPIRATORY: Negative for cough, wheezing CARDIOVASCULAR: Negative for chest pain, leg swelling, palpitations, orthopnea GI: SEE HPI MUSCULOSKELETAL: Negative for joint pain or swelling, back pain, and muscle pain. SKIN: Negative for lesions, rash PSYCH:  Negative for sleep disturbance, mood disorder and recent psychosocial stressors. HEMATOLOGY Negative for prolonged bleeding, bruising easily, and swollen nodes. ENDOCRINE: Negative for cold or heat intolerance, polyuria, polydipsia and goiter. NEURO: negative for tremor, gait imbalance, syncope and seizures. The remainder of the review of systems is noncontributory.   Physical Exam: BP 126/87 (BP Location: Left Arm, Patient Position: Sitting, Cuff Size: Large)   Pulse 70   Temp (!) 97.1 F (36.2 C) (Temporal)   Ht '5\' 9"'$  (1.753 m)   Wt 237 lb 8 oz (107.7 kg)   BMI 35.07 kg/m  GENERAL: The patient is AO x3, in no acute distress. HEENT: Head is normocephalic and atraumatic. EOMI are intact. Mouth is well hydrated  and without lesions. NECK: Supple. No masses LUNGS: Clear to auscultation. No presence of rhonchi/wheezing/rales. Adequate chest expansion HEART: RRR, normal s1 and s2. ABDOMEN: Soft, nontender, no guarding, no peritoneal signs, and nondistended. BS +. No masses. EXTREMITIES: Without any cyanosis, clubbing, rash, lesions or edema. NEUROLOGIC: AOx3, no focal motor deficit. SKIN: no jaundice, no rashes  Imaging/Labs: as above  I personally reviewed and interpreted the available labs, imaging and endoscopic files.  Impression and Plan: Mazzy Whitefoot is a 67 y.o. female with history of Karlene Lineman, obesity, depression, hypothyroidism, primary biliary cholangitis and recurrent choledocholithiasis, who presents for follow up of PBC and NASH.  The patient has clinically tolerated Alvino Blood, although she has presented mild pruritus, which has not been a major complaint for her.  Her most recent alkaline phosphatase was more than 1x the upper limit of normal which may be suboptimal based on most recent studies evaluating the progression of PBC to cirrhosis.  Due to this, we will aim to evaluate the treatment response in June 2024 will repeat CBC and CMP.  If at that time her alkaline phosphatase is not in the therapeutic range, we may need to increase her Ocaliva to 10 mg every day.  For now we will continue with the same dosage of the combined regimen.  Her PCP is currently following her lipid panel.  She is due for repeat DEXA scan given her increased risk of osteoporosis, we will schedule this today.  Regarding her NASH, this seems to be better controlled as her aminotransferases were minimally elevated 3 most recent blood workup.  She has gained some weight and could benefit from weight loss.  -Continue ursodiol 500 mg every 8 hours -Continue Ocaliva 5 mg qday -Schedule DEXA scan -Will recheck CBC and CMP in mid June 2024, may consider increasing Ocaliva to 10 mg every day if alkaline phosphatase is not  optimal -Repeat colonoscopy in 11/2022  All questions were answered.      Cynthia Peppers, MD Gastroenterology and Hepatology [facility] Gastroenterology

## 2022-08-11 ENCOUNTER — Other Ambulatory Visit: Payer: Self-pay

## 2022-08-15 ENCOUNTER — Other Ambulatory Visit (HOSPITAL_COMMUNITY): Payer: Self-pay

## 2022-08-19 ENCOUNTER — Telehealth (INDEPENDENT_AMBULATORY_CARE_PROVIDER_SITE_OTHER): Payer: Self-pay | Admitting: *Deleted

## 2022-08-19 ENCOUNTER — Other Ambulatory Visit (INDEPENDENT_AMBULATORY_CARE_PROVIDER_SITE_OTHER): Payer: Self-pay | Admitting: *Deleted

## 2022-08-19 MED ORDER — OCALIVA 5 MG PO TABS: 1.0000 | ORAL_TABLET | Freq: Every day | ORAL | 2 refills | Status: DC

## 2022-08-20 ENCOUNTER — Telehealth: Payer: Self-pay | Admitting: *Deleted

## 2022-08-20 NOTE — Telephone Encounter (Signed)
Rudean Haskell from [employer] notified me patient needed a new rx for ocaliva. She had been sent in #30 with two additional refills on 07/29/22 by Dr. Jenetta Downer. I let her know this but she said it needed to be resent. I resent rx on 08/19/22 and I called [facility] this morning and they have rx and it is set for deliver on 08/22/22.

## 2022-08-26 NOTE — Telephone Encounter (Signed)
error 

## 2022-09-22 ENCOUNTER — Telehealth (INDEPENDENT_AMBULATORY_CARE_PROVIDER_SITE_OTHER): Payer: Self-pay | Admitting: *Deleted

## 2022-09-22 DIAGNOSIS — K743 Primary biliary cirrhosis: Secondary | ICD-10-CM | POA: Diagnosis not present

## 2022-09-22 DIAGNOSIS — Z79899 Other long term (current) drug therapy: Secondary | ICD-10-CM | POA: Diagnosis not present

## 2022-09-22 DIAGNOSIS — K760 Fatty (change of) liver, not elsewhere classified: Secondary | ICD-10-CM | POA: Diagnosis not present

## 2022-09-22 NOTE — Telephone Encounter (Signed)
Auth for ocaliva 5mg  expires 10/16/22. Pt tried to fill today and unable to get med. I called pharmacy and was told copay assistance has no balance left on her account. Need to call phone number [phone] for co pay assistance. Also asked pt if she had any labs done for liver profile to send in with auth I need to do soon. Insurance wants to see 15% reduction in alkaline phosphate. Last one we have on file was December and it had increased. She states she had labs done today ordered by Dr. Ouida Sills. Will call his office to get copy of labs. Pt told me she had enough med for 2 -3 weeks. Tim from [employer] is suppose to call me this afternoon to discuss.

## 2022-09-23 LAB — LAB REPORT - SCANNED: EGFR: 71

## 2022-09-23 NOTE — Telephone Encounter (Signed)
Cynthia Donaldson never called me from [employer]. I called the phone number given to me by pharmacy and pt was reenrolled in copay assistance program and I was given numbers below:  [facility] # [id number] Group # [id number] Pcn # [id number] Id # [id number]

## 2022-09-23 NOTE — Telephone Encounter (Signed)
Called pharmacy and gave co pay card information. Was told it be updated by end of the day today.

## 2022-09-23 NOTE — Telephone Encounter (Signed)
Pt was notified she should be able to call pharmacy tomorrow to get refill and to call me back if any issues.

## 2022-09-24 ENCOUNTER — Telehealth: Payer: Self-pay | Admitting: *Deleted

## 2022-09-24 NOTE — Telephone Encounter (Signed)
Patient ocaliva 5mg  auth runs out 5/23 and will need new auth. She has done labs with pcp on 4/29 and alk phosphatase is 139. Drug rep told me insurance will be looking for 15 % decease in alk phos. Before treatment Dec 2022 alk phos was 164 which is 16 % decrease. I saw last note may increase to 10mg  after labs in June. Was not sure if she still needed labs since done pcp 4/29. Labs on your desk for review.

## 2022-09-25 NOTE — Telephone Encounter (Signed)
I reviewed her most recent blood workup.  I agree, with most recent blood workup there was a decrease in 16%. Labs from 03/08/23 showed  alk phos 139 ALT 34 AST 27 Tbili 0.5.  We will continue her at the same dosage for now. Can you please send the renewal? Thanks

## 2022-09-26 ENCOUNTER — Ambulatory Visit (HOSPITAL_COMMUNITY)
Admission: RE | Admit: 2022-09-26 | Discharge: 2022-09-26 | Disposition: A | Discharge status: Home / Self Care | Payer: Commercial Managed Care - PPO | Source: Ambulatory Visit | Attending: Gastroenterology | Admitting: Gastroenterology

## 2022-09-26 DIAGNOSIS — K743 Primary biliary cirrhosis: Secondary | ICD-10-CM | POA: Diagnosis not present

## 2022-09-26 DIAGNOSIS — Z13820 Encounter for screening for osteoporosis: Secondary | ICD-10-CM | POA: Insufficient documentation

## 2022-09-26 DIAGNOSIS — Z780 Asymptomatic menopausal state: Secondary | ICD-10-CM | POA: Diagnosis not present

## 2022-09-29 DIAGNOSIS — E785 Hyperlipidemia, unspecified: Secondary | ICD-10-CM | POA: Diagnosis not present

## 2022-09-29 DIAGNOSIS — E039 Hypothyroidism, unspecified: Secondary | ICD-10-CM | POA: Diagnosis not present

## 2022-09-29 DIAGNOSIS — K743 Primary biliary cirrhosis: Secondary | ICD-10-CM | POA: Diagnosis not present

## 2022-10-01 NOTE — Telephone Encounter (Signed)
Left message with pt to return call to let her know Dr. Salena Saner reviewed labs and wants to keep her on same dose and to find out if she has filled med yet and then can start on authorization.

## 2022-10-01 NOTE — Telephone Encounter (Signed)
PA submitted through cover my meds and was approved from 10/01/22 - 10/01/23 for ocaliva 5mg  daily tablets. Approval letter faxed to [facility].

## 2022-10-05 ENCOUNTER — Ambulatory Visit
Admission: EM | Admit: 2022-10-05 | Discharge: 2022-10-05 | Disposition: A | Discharge status: Home / Self Care | Payer: Commercial Managed Care - PPO | Attending: Nurse Practitioner | Admitting: Nurse Practitioner

## 2022-10-05 ENCOUNTER — Encounter: Payer: Self-pay | Admitting: Emergency Medicine

## 2022-10-05 DIAGNOSIS — H6092 Unspecified otitis externa, left ear: Secondary | ICD-10-CM

## 2022-10-05 DIAGNOSIS — H65192 Other acute nonsuppurative otitis media, left ear: Secondary | ICD-10-CM | POA: Diagnosis not present

## 2022-10-05 MED ORDER — CIPROFLOXACIN-DEXAMETHASONE 0.3-0.1 % OT SUSP: 4.0000 [drp] | Freq: Two times a day (BID) | OTIC | 0 refills | Status: AC

## 2022-10-05 NOTE — Discharge Instructions (Signed)
Use eardrops as prescribed. May take over-the-counter Tylenol as needed for pain, fever, general discomfort. Warm compresses to the left ear as needed for pain or discomfort. As discussed, recommend using Flonase nasal spray or an antihistamine such as Zyrtec, Allegra, or Claritin to help with the fluid in the left middle ear. Avoid entrance of water in the left ear canal symptoms persist. If symptoms do not improve with this treatment, recommend following up with your primary care physician for further evaluation. Follow-up as needed.

## 2022-10-05 NOTE — ED Triage Notes (Signed)
Left ear pain and unable to hear out of that ear x 2 days.  Has been using an old antibiotic ear drop to help with pain without relief.

## 2022-10-05 NOTE — ED Provider Notes (Signed)
[facility]    CSN: [id number] Arrival date & time: 10/05/22  1047      History   Chief Complaint No chief complaint on file.   HPI Cynthia Donaldson is a 67 y.o. female.   The history is provided by the patient.   The patient presents for complaints of left ear pain that has been present for the past 2 days.  Patient complains of decreased hearing in the left ear along with hearing "crackling" noises.  She denies recent fever, chills, rhinorrhea, sinus congestion, sore throat, weakness or dizziness.  Patient reports that she does wear hearing aids, and this sometimes causes inflammation in the ear canal.  Patient denies history of cerumen impaction.  Past Medical History:  Diagnosis Date   Bile duct stenosis    Hyperlipidemia    Hypothyroidism    PONV (postoperative nausea and vomiting)    Sleep apnea     Patient Active Problem List   Diagnosis Date Noted   Lichen sclerosus et atrophicus 04/14/2022   Vulvar itching 04/14/2022   Constipation 01/22/2022   Choledocholithiasis 12/25/2021   Class 1 obesity 12/25/2021   Hypothyroidism 12/25/2021   Depression 12/25/2021   Encounter for gynecological examination with Papanicolaou smear of cervix 07/04/2021   Pelvic cramping 07/04/2021   Postmenopause 07/04/2021   Diarrhea 06/07/2021   Abdominal pain, epigastric 01/31/2020   Nonalcoholic steatohepatitis (NASH) 01/25/2019   RUQ abdominal pain 01/25/2019   Special screening for malignant neoplasms, colon 09/28/2017   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   Fracture of distal phalanx of right thumb 11/02/2012   Thumb pain 11/02/2012   Pain in joint, shoulder region 04/08/2011   Muscle weakness (generalized) 04/08/2011   Rotator cuff tear, right 04/03/2011   Rotator cuff syndrome of right shoulder 01/28/2011   TRIGGER FINGER 06/17/2007   COLLES' FRACTURE, RIGHT WRIST 05/18/2007    Past Surgical History:  Procedure Laterality Date   anal fissure repair      BILIARY DILATION  12/26/2021   Procedure: BILIARY DILATION;  Surgeon: Lemar Lofty., MD;  Location: [facility] ENDOSCOPY;  Service: Gastroenterology;;   BIOPSY  12/26/2021   Procedure: BIOPSY;  Surgeon: Lemar Lofty., MD;  Location: [facility] ENDOSCOPY;  Service: Gastroenterology;;   CHOLECYSTECTOMY     COLONOSCOPY N/A 12/02/2017   Procedure: COLONOSCOPY;  Surgeon: Malissa Hippo, MD;  [facility];  Service: Endoscopy;  Laterality: N/A;  1030   ERCP      X3   ERCP N/A 12/26/2021   Procedure: ENDOSCOPIC RETROGRADE CHOLANGIOPANCREATOGRAPHY (ERCP);  Surgeon: Lemar Lofty., MD;  Location: [facility] ENDOSCOPY;  Service: Gastroenterology;  Laterality: N/A;   ESOPHAGOGASTRODUODENOSCOPY N/A 12/26/2021   Procedure: ESOPHAGOGASTRODUODENOSCOPY (EGD);  Surgeon: Lemar Lofty., MD;  Location: [facility] ENDOSCOPY;  Service: Gastroenterology;  Laterality: N/A;   MYOMECTOMY     REMOVAL OF STONES  12/26/2021   Procedure: REMOVAL OF STONES;  Surgeon: Meridee Score Netty Starring., MD;  Location[facility] ENDOSCOPY;  Service: Gastroenterology;;   Dennison Mascot  12/26/2021   Procedure: Dennison Mascot;  Surgeon: Lemar Lofty., MD;  Location: [facility] ENDOSCOPY;  Service: Gastroenterology;;   TONSILLECTOMY      OB History     Gravida  1   Para      Term      Preterm      AB  1   Living  0      SAB  1   IAB      Ectopic  Multiple      Live Births               Home Medications    Prior to Admission medications   Medication Sig Start Date End Date Taking? Authorizing Provider  ciprofloxacin-dexamethasone (CIPRODEX) OTIC suspension Place 4 drops into the left ear 2 (two) times daily for 7 days. 10/05/22 10/12/22 Yes Quintina Hakeem-Warren, Sadie Haber, NP  buPROPion (WELLBUTRIN XL) 150 MG 24 hr tablet Take 3 tablets (450 mg total) by mouth daily. 05/15/22     dicyclomine (BENTYL) 10 MG capsule Take 1 capsule (10 mg total) by mouth 2 (two) times daily before a meal. Patient taking  differently: Take 10 mg by mouth 2 (two) times daily as needed for spasms. 06/07/21   Malissa Hippo, MD  levothyroxine (SYNTHROID) 150 MCG tablet Take 1 tablet (150 mcg total) by mouth daily. 05/15/22     Obeticholic Acid (OCALIVA) 5 MG TABS Take 1 tablet by mouth daily. 08/19/22   Dolores Frame, MD  ursodiol (ACTIGALL) 500 MG tablet Take 1 tablet (500 mg total) by mouth 3 (three) times daily. 07/07/22       Family History Family History  Problem Relation Age of Onset   Hypertension Father    Cancer Father        stomach   Heart disease Mother    Colon cancer Brother    Suicidality Brother    Hypertension Brother    Arthritis Brother    Hypertension Brother    Arthritis Brother    Hypertension Brother    Arthritis Brother    Heart attack Brother    Suicidality Sister    Heart disease Other    Arthritis Other    Cancer Other     Social History Social History   Tobacco Use   Smoking status: Former    Types: Cigarettes    Quit date: 2022    Years since quitting: 2.3   Smokeless tobacco: Never   Tobacco comments:    07/2019  Vaping Use   Vaping Use: Never used  Substance Use Topics   Alcohol use: Yes    Comment: monthly   Drug use: No     Allergies   Codeine   Review of Systems Review of Systems Per HPI  Physical Exam Triage Vital Signs ED Triage Vitals  Enc Vitals Group     BP 10/05/22 1052 112/70     Pulse Rate 10/05/22 1052 76     Resp 10/05/22 1052 18     Temp 10/05/22 1052 97.9 F (36.6 C)     Temp Source 10/05/22 1052 Oral     SpO2 10/05/22 1052 92 %     Weight --      Height --      Head Circumference --      Peak Flow --      Pain Score 10/05/22 1054 7     Pain Loc --      Pain Edu? --      Excl. in GC? --    No data found.  Updated Vital Signs BP 112/70 (BP Location: Right Arm)   Pulse 76   Temp 97.9 F (36.6 C) (Oral)   Resp 18   SpO2 92%   Visual Acuity Right Eye Distance:   Left Eye Distance:   Bilateral  Distance:    Right Eye Near:   Left Eye Near:    Bilateral Near:     Physical Exam Vitals and  nursing note reviewed.  Constitutional:      General: She is not in acute distress.    Appearance: Normal appearance.  HENT:     Head: Normocephalic.     Right Ear: Tympanic membrane, ear canal and external ear normal.     Left Ear: External ear normal.     Ears:     Comments: Erythema and swelling of the left ear canal.  Left middle ear effusion is also present.    Nose: Nose normal.     Mouth/Throat:     Mouth: Mucous membranes are moist.  Eyes:     Extraocular Movements: Extraocular movements intact.     Pupils: Pupils are equal, round, and reactive to light.  Cardiovascular:     Rate and Rhythm: Normal rate and regular rhythm.     Pulses: Normal pulses.     Heart sounds: Normal heart sounds.  Pulmonary:     Effort: Pulmonary effort is normal. No respiratory distress.     Breath sounds: Normal breath sounds. No stridor. No wheezing, rhonchi or rales.  Abdominal:     General: Bowel sounds are normal.     Palpations: Abdomen is soft.     Tenderness: There is no abdominal tenderness.  Musculoskeletal:     Cervical back: Normal range of motion.  Lymphadenopathy:     Cervical: No cervical adenopathy.  Neurological:     General: No focal deficit present.     Mental Status: She is alert and oriented to person, place, and time.  Psychiatric:        Mood and Affect: Mood normal.        Behavior: Behavior normal.      UC Treatments / Results  Labs (all labs ordered are listed, but only abnormal results are displayed) Labs Reviewed - No data to display  EKG   Radiology No results found.  Procedures Procedures (including critical care time)  Medications Ordered in UC Medications - No data to display  Initial Impression / Assessment and Plan / UC Course  I have reviewed the triage vital signs and the nursing notes.  Pertinent labs & imaging results that were  available during my care of the patient were reviewed by me and considered in my medical decision making (see chart for details).  The patient is well-appearing, she is in no acute distress, vital signs are stable.  Suspect a left otitis externa.  Will treat with Ciprodex eardrops.  With regard to the left middle ear effusion.  Patient states she will use Flonase nasal spray and an antihistamine.  Supportive care recommendations were provided and discussed with the patient to include use of over-the-counter analgesics such as Tylenol, warm compresses, and avoiding the entrance of water inside of the ear canal while symptoms persist.  Patient is in agreement with this plan of care and verbalizes understanding.  All questions were answered.  Patient stable for discharge.   Final Clinical Impressions(s) / UC Diagnoses   Final diagnoses:  Otitis externa of left ear, unspecified chronicity, unspecified type  Acute MEE (middle ear effusion), left     Discharge Instructions      Use eardrops as prescribed. May take over-the-counter Tylenol as needed for pain, fever, general discomfort. Warm compresses to the left ear as needed for pain or discomfort. As discussed, recommend using Flonase nasal spray or an antihistamine such as Zyrtec, Allegra, or Claritin to help with the fluid in the left middle ear. Avoid entrance of water in the  left ear canal symptoms persist. If symptoms do not improve with this treatment, recommend following up with your primary care physician for further evaluation. Follow-up as needed.     ED Prescriptions     Medication Sig Dispense Auth. Provider   ciprofloxacin-dexamethasone (CIPRODEX) OTIC suspension Place 4 drops into the left ear 2 (two) times daily for 7 days. 7.5 mL Madolin Twaddle-Warren, Sadie Haber, NP      PDMP not reviewed this encounter.   Abran Cantor, NP 10/05/22 1142

## 2022-10-06 NOTE — Telephone Encounter (Signed)
[facility] website still showing PA needed. I have called pharmacy and they told me they have approval letter I faxed but it just has not been updated. I also spoke to tim from [employer] who told me to fax approval letter to fax number [phone] which I did.

## 2022-10-07 NOTE — Telephone Encounter (Signed)
[facility] website now showing PA approved and expires 10/01/23

## 2022-10-07 NOTE — Telephone Encounter (Signed)
Left message to return call to let pt know that med was approved.

## 2022-10-07 NOTE — Telephone Encounter (Signed)
Patient notified

## 2022-10-10 ENCOUNTER — Telehealth (INDEPENDENT_AMBULATORY_CARE_PROVIDER_SITE_OTHER): Payer: Self-pay

## 2022-10-10 NOTE — Telephone Encounter (Signed)
At last office visit you had asked that we get cbc and cmp done on patient mid June 2024. Patient had labs done at the end of April 2024. Do we still need to get the labs for mid June?

## 2022-10-10 NOTE — Telephone Encounter (Signed)
She will need to have repeat labs in October 2024 Can we request to fax the most recent labs from Dr. Alonza Smoker office? Thanks

## 2022-10-13 NOTE — Telephone Encounter (Signed)
Noted to have drawn second week in October.

## 2022-10-13 NOTE — Telephone Encounter (Signed)
So She will need just a Cbc, and CMP in Oct 2024? Lab from April from Dr. Ouida Sills have been reviewed by you the results are in Epic under [facility] . See note from 09/25/2022.

## 2022-10-13 NOTE — Telephone Encounter (Signed)
The labs from last year. I was able to review the labs from 09/22/2022 today showing glucose of 87, BUN 13, creatinine 0.9, sodium 144, potassium 4.7, albumin 4.1, total bilirubin 0.5, alkaline phosphatase 139, AST 27, ALT 34, lipid panel with cholesterol 229, platelets 97, LDL 156. Yes, please repeat dose labs in October

## 2022-10-14 DIAGNOSIS — H903 Sensorineural hearing loss, bilateral: Secondary | ICD-10-CM | POA: Diagnosis not present

## 2022-10-28 ENCOUNTER — Encounter (INDEPENDENT_AMBULATORY_CARE_PROVIDER_SITE_OTHER): Payer: Self-pay | Admitting: *Deleted

## 2022-10-31 ENCOUNTER — Other Ambulatory Visit: Payer: Self-pay

## 2022-11-03 ENCOUNTER — Other Ambulatory Visit: Payer: Self-pay | Admitting: Adult Health

## 2022-11-03 ENCOUNTER — Other Ambulatory Visit: Payer: Self-pay

## 2022-11-03 MED ORDER — CLOBETASOL PROPIONATE 0.05 % EX OINT
TOPICAL_OINTMENT | CUTANEOUS | 3 refills | Status: AC
  Filled 2022-11-03: qty 30, 30d supply, fill #0
  Filled 2022-11-06 (×2): qty 30, 21d supply, fill #0
  Filled 2023-11-02: qty 30, 21d supply, fill #1

## 2022-11-03 NOTE — Progress Notes (Signed)
Refilled temovate ° °

## 2022-11-04 ENCOUNTER — Other Ambulatory Visit (HOSPITAL_COMMUNITY): Payer: Self-pay

## 2022-11-05 ENCOUNTER — Other Ambulatory Visit (HOSPITAL_COMMUNITY): Payer: Self-pay

## 2022-11-06 ENCOUNTER — Other Ambulatory Visit (HOSPITAL_COMMUNITY): Payer: Self-pay

## 2022-11-07 ENCOUNTER — Other Ambulatory Visit (HOSPITAL_COMMUNITY): Payer: Self-pay

## 2022-11-07 ENCOUNTER — Other Ambulatory Visit: Payer: Self-pay

## 2022-11-11 ENCOUNTER — Other Ambulatory Visit: Payer: Self-pay

## 2022-11-12 ENCOUNTER — Other Ambulatory Visit (INDEPENDENT_AMBULATORY_CARE_PROVIDER_SITE_OTHER): Payer: Self-pay | Admitting: *Deleted

## 2022-11-12 ENCOUNTER — Telehealth (INDEPENDENT_AMBULATORY_CARE_PROVIDER_SITE_OTHER): Payer: Self-pay | Admitting: *Deleted

## 2022-11-12 NOTE — Telephone Encounter (Signed)
Masali from [facility] called to let me know pt was due for refill on ocaliva on 6/9 and states She will need refills sent in. Patient states she talked with pharmacy yesterday and med will be delivered on Tuesday. Pt advised to call back if any issues getting med.

## 2022-11-13 NOTE — Telephone Encounter (Signed)
Will need to send refills of ocaliva after pt receives shipment on Tuesday. Will hold message til Tuesday.

## 2022-11-14 ENCOUNTER — Other Ambulatory Visit: Payer: Self-pay

## 2022-11-17 DIAGNOSIS — G4733 Obstructive sleep apnea (adult) (pediatric): Secondary | ICD-10-CM | POA: Diagnosis not present

## 2022-11-19 ENCOUNTER — Telehealth (INDEPENDENT_AMBULATORY_CARE_PROVIDER_SITE_OTHER): Payer: Self-pay | Admitting: *Deleted

## 2022-11-19 ENCOUNTER — Other Ambulatory Visit (INDEPENDENT_AMBULATORY_CARE_PROVIDER_SITE_OTHER): Payer: Self-pay | Admitting: *Deleted

## 2022-11-19 ENCOUNTER — Other Ambulatory Visit (INDEPENDENT_AMBULATORY_CARE_PROVIDER_SITE_OTHER): Payer: Self-pay | Admitting: Gastroenterology

## 2022-11-19 DIAGNOSIS — K743 Primary biliary cirrhosis: Secondary | ICD-10-CM

## 2022-11-19 MED ORDER — OCALIVA 5 MG PO TABS: 1.0000 | ORAL_TABLET | Freq: Every day | ORAL | 3 refills | Status: DC

## 2022-11-19 NOTE — Telephone Encounter (Signed)
Pt notified med should ship on 11/24/22. I will hold chart to check on [facility] website to make sure no issues with shipment.

## 2022-11-19 NOTE — Telephone Encounter (Signed)
Patient called and told me she was suppose to receive ocaliva yesterday and did not and she called pharmacy and was told there was an issue with co pay card. I caleld [facility] and they did not have up to date co pay card on file. I gave them the updated information  [facility] # [id number] Group # [id number] Pcn # [id number] Id # [id number] I was told it would take 4 -5 days to update in system. Pt told me she had enough med for 2 weeks.  Number I called for [facility] [phone] and I was told  [facility] direct number [phone] if I need to call back. ( Will follow up on this ).

## 2022-11-19 NOTE — Telephone Encounter (Signed)
Pt needs refills on ocaliva 5mg  to [facility] pharmacy. Last OV 08/04/22.

## 2022-11-19 NOTE — Telephone Encounter (Signed)
Medication sent to pharmacy  

## 2022-11-19 NOTE — Telephone Encounter (Signed)
error 

## 2022-11-20 NOTE — Telephone Encounter (Signed)
Called [facility] and was told the co pay card was update and med is scheduled for delivery.

## 2022-11-21 ENCOUNTER — Other Ambulatory Visit: Payer: Self-pay

## 2022-11-21 NOTE — Telephone Encounter (Signed)
Pt is suppose to receive med on July 1st. Will check on Monday to make sure pt received shipment.

## 2022-11-24 NOTE — Telephone Encounter (Signed)
Pt med delivered per [facility] website today at 11:11 am.

## 2022-11-28 ENCOUNTER — Other Ambulatory Visit: Payer: Self-pay

## 2022-11-28 ENCOUNTER — Other Ambulatory Visit (HOSPITAL_COMMUNITY): Payer: Self-pay

## 2022-12-08 ENCOUNTER — Other Ambulatory Visit (INDEPENDENT_AMBULATORY_CARE_PROVIDER_SITE_OTHER): Payer: Self-pay | Admitting: *Deleted

## 2022-12-15 ENCOUNTER — Other Ambulatory Visit (INDEPENDENT_AMBULATORY_CARE_PROVIDER_SITE_OTHER): Payer: Self-pay | Admitting: *Deleted

## 2022-12-16 ENCOUNTER — Telehealth (INDEPENDENT_AMBULATORY_CARE_PROVIDER_SITE_OTHER): Payer: Self-pay | Admitting: *Deleted

## 2022-12-16 ENCOUNTER — Other Ambulatory Visit (INDEPENDENT_AMBULATORY_CARE_PROVIDER_SITE_OTHER): Payer: Self-pay | Admitting: *Deleted

## 2022-12-16 NOTE — Telephone Encounter (Signed)
Discussed with patient

## 2022-12-16 NOTE — Telephone Encounter (Signed)
Pt called yesterday. States she received a call last Thursday from [facility] stating she owed $250 for med she received last month and was told this upcoming shipment she would owe $250. I called [facility] and was told to call co pay assist phone number [phone]. Was told the claim from 6/28 was first rejected but then filed again and paid on 11/24/22. The shipment for this month is being rejected due to age I was told to call pharmacy help desk to find out why since no age restriction on med. I called back to pharmacy and transferred back to co pay assist where I was told that once a patient becomes 71 and able to received medicare that pharmacy has to verify that pt does not have medicare in order to received co pay assist. She told me that this would happen on day of shipment which is 7/23 and when I spoke to her on 7/22 she was not able to do one day early. She said pharmacist would take care of that part and nothing I needed to do on our end. Med is due for shipment. Left message to return call with pt to discuss.

## 2022-12-17 DIAGNOSIS — G4733 Obstructive sleep apnea (adult) (pediatric): Secondary | ICD-10-CM | POA: Diagnosis not present

## 2022-12-18 NOTE — Telephone Encounter (Signed)
Med shipped today to pt per [facility] website.

## 2023-01-27 DIAGNOSIS — K743 Primary biliary cirrhosis: Secondary | ICD-10-CM | POA: Diagnosis not present

## 2023-01-30 DIAGNOSIS — G4733 Obstructive sleep apnea (adult) (pediatric): Secondary | ICD-10-CM | POA: Diagnosis not present

## 2023-01-30 DIAGNOSIS — K743 Primary biliary cirrhosis: Secondary | ICD-10-CM | POA: Diagnosis not present

## 2023-02-02 ENCOUNTER — Telehealth (INDEPENDENT_AMBULATORY_CARE_PROVIDER_SITE_OTHER): Payer: Self-pay | Admitting: *Deleted

## 2023-02-02 NOTE — Telephone Encounter (Signed)
Patient's hepatic function panel done on 01/27/23 ordered by pcp Dr. Ouida Sills on your desk for review.

## 2023-02-04 ENCOUNTER — Other Ambulatory Visit: Payer: Self-pay

## 2023-02-04 ENCOUNTER — Other Ambulatory Visit (HOSPITAL_COMMUNITY): Payer: Self-pay

## 2023-02-09 ENCOUNTER — Other Ambulatory Visit (INDEPENDENT_AMBULATORY_CARE_PROVIDER_SITE_OTHER): Payer: Self-pay | Admitting: Gastroenterology

## 2023-02-09 ENCOUNTER — Other Ambulatory Visit (HOSPITAL_COMMUNITY): Payer: Self-pay

## 2023-02-09 DIAGNOSIS — K743 Primary biliary cirrhosis: Secondary | ICD-10-CM

## 2023-02-09 MED ORDER — OCALIVA 10 MG PO TABS
10.0000 mg | ORAL_TABLET | Freq: Every day | ORAL | 3 refills | Status: DC
Start: 2023-02-09 — End: 2024-01-29

## 2023-02-09 NOTE — Telephone Encounter (Signed)
Please set up a reminder for repeat CMP in 3 months.

## 2023-02-09 NOTE — Telephone Encounter (Signed)
I received the results of the most recent blood testing that showed an alkaline phosphatase of 145 and ALT of 54, AST was mildly elevated at 33, total bilirubin was 0.6. I discussed with the patient that even though her values are better compared to prior they are still not at goal as her alkaline phosphatase is more than 1 time the upper limit of normal.  Due to this I will increase her prescription of obeticholic acid to 10 mg/day.

## 2023-02-10 NOTE — Telephone Encounter (Signed)
Pt added to reminder file for repeat cmp in 3 months.

## 2023-02-13 ENCOUNTER — Encounter (INDEPENDENT_AMBULATORY_CARE_PROVIDER_SITE_OTHER): Payer: Self-pay

## 2023-02-20 ENCOUNTER — Other Ambulatory Visit (INDEPENDENT_AMBULATORY_CARE_PROVIDER_SITE_OTHER): Payer: Self-pay

## 2023-02-20 DIAGNOSIS — R1011 Right upper quadrant pain: Secondary | ICD-10-CM

## 2023-02-20 DIAGNOSIS — K743 Primary biliary cirrhosis: Secondary | ICD-10-CM

## 2023-02-20 DIAGNOSIS — E039 Hypothyroidism, unspecified: Secondary | ICD-10-CM

## 2023-02-20 DIAGNOSIS — K8050 Calculus of bile duct without cholangitis or cholecystitis without obstruction: Secondary | ICD-10-CM

## 2023-03-04 DIAGNOSIS — G4733 Obstructive sleep apnea (adult) (pediatric): Secondary | ICD-10-CM | POA: Diagnosis not present

## 2023-03-13 DIAGNOSIS — E039 Hypothyroidism, unspecified: Secondary | ICD-10-CM | POA: Diagnosis not present

## 2023-03-13 DIAGNOSIS — R1011 Right upper quadrant pain: Secondary | ICD-10-CM | POA: Diagnosis not present

## 2023-03-13 DIAGNOSIS — K743 Primary biliary cirrhosis: Secondary | ICD-10-CM | POA: Diagnosis not present

## 2023-03-13 DIAGNOSIS — K8050 Calculus of bile duct without cholangitis or cholecystitis without obstruction: Secondary | ICD-10-CM | POA: Diagnosis not present

## 2023-03-15 LAB — COMPREHENSIVE METABOLIC PANEL
ALT: 43 [IU]/L — ABNORMAL HIGH (ref 0–32)
AST: 30 [IU]/L (ref 0–40)
Albumin: 4.5 g/dL (ref 3.9–4.9)
Alkaline Phosphatase: 156 [IU]/L — ABNORMAL HIGH (ref 44–121)
BUN/Creatinine Ratio: 15 (ref 12–28)
BUN: 16 mg/dL (ref 8–27)
Bilirubin Total: 0.6 mg/dL (ref 0.0–1.2)
CO2: 20 mmol/L (ref 20–29)
Calcium: 9.1 mg/dL (ref 8.7–10.3)
Chloride: 108 mmol/L — ABNORMAL HIGH (ref 96–106)
Creatinine, Ser: 1.07 mg/dL — ABNORMAL HIGH (ref 0.57–1.00)
Globulin, Total: 2.6 g/dL (ref 1.5–4.5)
Glucose: 91 mg/dL (ref 70–99)
Potassium: 3.9 mmol/L (ref 3.5–5.2)
Sodium: 144 mmol/L (ref 134–144)
Total Protein: 7.1 g/dL (ref 6.0–8.5)
eGFR: 57 mL/min/{1.73_m2} — ABNORMAL LOW (ref >59)

## 2023-03-15 LAB — CBC WITH DIFFERENTIAL/PLATELET
Basos: 0 %
EOS (ABSOLUTE): 0 10*3/uL (ref 0.0–0.2)
Eos: 1 %
Hematocrit: 47.1 % — ABNORMAL HIGH (ref 34.0–46.6)
Hemoglobin: 15.7 g/dL (ref 11.1–15.9)
Immature Granulocytes: 0 %
Immature Granulocytes: 0 10*3/uL (ref 0.0–0.1)
Lymphs: 20 %
MCH: 30.7 pg (ref 26.6–33.0)
MCHC: 33.3 g/dL (ref 31.5–35.7)
MCV: 92 fL (ref 79–97)
Monocytes Absolute: 0.1 10*3/uL (ref 0.0–0.4)
Monocytes Absolute: 0.4 10*3/uL (ref 0.1–0.9)
Monocytes: 6 %
Neutrophils Absolute: 1.4 10*3/uL (ref 0.7–3.1)
Neutrophils Absolute: 4.9 10*3/uL (ref 1.4–7.0)
Neutrophils: 73 %
Platelets: 33 10*3/uL — CL (ref 150–450)
RBC: 5.12 x10E6/uL (ref 3.77–5.28)
RDW: 12.2 % (ref 11.7–15.4)
WBC: 6.9 10*3/uL (ref 3.4–10.8)

## 2023-03-16 ENCOUNTER — Other Ambulatory Visit (INDEPENDENT_AMBULATORY_CARE_PROVIDER_SITE_OTHER): Payer: Self-pay

## 2023-03-16 DIAGNOSIS — D696 Thrombocytopenia, unspecified: Secondary | ICD-10-CM

## 2023-03-18 DIAGNOSIS — D696 Thrombocytopenia, unspecified: Secondary | ICD-10-CM | POA: Insufficient documentation

## 2023-03-18 NOTE — Progress Notes (Signed)
[facility] [address], [place] [address]   Clinic Day:  03/19/2023  Referring physician: Marguerita Merles, Valentino Saxon*  Patient Care Team: Carylon Perches, MD as PCP - General (Internal Medicine)   ASSESSMENT & PLAN:   Assessment:  1.  Severe thrombocytopenia: - Patient seen at the request of Dr. Levon Hedger - CBC on 03/13/2023: PLT-33, Hb-15.7, WBC-6.9. 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 - She denies drinking tonic water/using quinine supplements. - Reports easy bruising on the extremities for the last 2 months.  No active bleeding.  No recent infections or antibiotic use.  2. Social/Family History:  -Retired. No chemical exposure. Quit tobacco use 2 years ago with 2 cigarettes a day  and off since age 52. -No family history of decreased platelets. Brother died of colon cancer. Father had stomach cancer. Brother has discoid lupus.    Plan:  1.  Severe thrombocytopenia: - We have repeated her platelet count today in the office.  Initial sample showed platelet clumping.  Repeat sample confirmed platelet count to be low at 34.  Rest of the CBC was normal.  Differential was normal. - We will check for nutritional deficiencies, infectious etiologies, connective tissue disorders and bone marrow infiltrative process. - Her platelet count over the last several years has been normal.  Last normal platelet count was in October 2023. - There is slightly increased risk of immune thrombocytopenia in patients with primary biliary cirrhosis. - I have recommended giving a trial of dexamethasone 40 mg daily for 4 days and repeat platelet count in 1 week.  If platelet count improves, immune mediated thrombocytopenia is likely. - Discussed with the patient.   No orders of the defined types were placed in this encounter.     Alben Deeds Teague,acting as a Neurosurgeon for Doreatha Massed, MD.,have  documented all relevant documentation on the behalf of Doreatha Massed, MD,as directed by  Doreatha Massed, MD while in the presence of Doreatha Massed, MD.   I, Doreatha Massed MD, have reviewed the above documentation for accuracy and completeness, and I agree with the above.   [address] R Teague   10/24/20249:05 AM  CHIEF COMPLAINT/PURPOSE OF CONSULT:   Diagnosis: Severe thrombocytopenia  Current Therapy: Trial of dexamethasone  HISTORY OF PRESENT ILLNESS:   Cynthia Donal[facility]on is a 67 y.o. female presenting to clinic today for evaluation of thrombocytopenia at the request of Dr. Levon Hedger.  Today, she states that she is doing well overall. Her appetite level is at 90%. Her energy level is at 25%.  She was found to have abnormal CBC from 03/13/23 with elevated HCT at 47.1 and severely low platelets at 33. Of note, she had an abnormal hepatic function panel on 01/28/23 with elevated alkaline phophatase at 145 and elevated ALT at 54. She has a 14 year history of primary biliary cirrhosis.   She reports easily bruising on extremities for the past 2 months. She denies any bruising on the trunk, epistaxis, hematochezia, BRBPR, or melena. She denies any fever, night sweats, or unexpected weight loss. She does report difficulty falling asleep.  She does not take any OTC medications, except for Advil prn. She does not drink any quinine substances or tonic water. She does not eat walnuts. She denies any recent infections or antibiotic use. She has never had H. Pylori infection. She has never been on steroids.  PAST MEDICAL HISTORY:   Past Medical History: Past  Medical History:  Diagnosis Date   Bile duct stenosis    Hyperlipidemia    Hypothyroidism    PONV (postoperative nausea and vomiting)    Sleep apnea     Surgical History: Past Surgical History:  Procedure Laterality Date   anal fissure repair     BILIARY DILATION  12/26/2021   Procedure: BILIARY DILATION;  Surgeon: Lemar Lofty., MD;  Location: [facility] ENDOSCOPY;  Service: Gastroenterology;;   BIOPSY  12/26/2021   Procedure: BIOPSY;  Surgeon: Lemar Lofty., MD;  Location: [facility] ENDOSCOPY;  Service: Gastroenterology;;   CHOLECYSTECTOMY     COLONOSCOPY N/A 12/02/2017   Procedure: COLONOSCOPY;  Surgeon: Malissa Hippo, MD;  Location: [facility] ENDO SU[facility];  Service: Endoscopy;  Laterality: N/A;  1030   ERCP      X3   ERCP N/A 12/26/2021   Procedure: ENDOSCOPIC RETROGRADE CHOLANGIOPANCREATOGRAPHY (ERCP);  Surgeon: Lemar Lofty., MD;  Location: [facility] ENDOSCOPY;  Service: Gastroenterology;  Laterality: N/A;   ESOPHAGOGASTRODUODENOSCOPY N/A 12/26/2021   Procedure: ESOPHAGOGASTRODUODENOSCOPY (EGD);  Surgeon: Lemar Lofty., MD;  Location: [facility] ENDOSCOPY;  Service: Gastroenterology;  Laterality: N/A;   MYOMECTOMY     REMOVAL OF STONES  12/26/2021   Procedure: REMOVAL OF STONES;  Surgeon: Mansouraty, Netty Starring., MD;  Location: [facility] ENDOSCOPY;  Service: Gastroenterology;;   Dennison Mascot  12/26/2021   Procedure: Dennison Mascot;  Surgeon: Mansouraty, Netty Starring., MD;  Location: [facility] ENDOSCOPY;  Service: Gastroenterology;;   TONSILLECTOMY      Social History: Social History   Socioeconomic History   Marital status: Married    Spouse name: Not on file   Number of children: Not on file   Years of education: Not on file   Highest education level: Not on file  Occupational History   Occupation: Homemaker  Tobacco Use   Smoking status: Former    Current packs/day: 0.00    Types: Cigarettes    Quit date: 2022    Years since quitting: 2.8   Smokeless tobacco: Never   Tobacco comments:    07/2019  Vaping Use   Vaping status: Never Used  Substance and Sexual Activity   Alcohol use: Yes    Comment: monthly   Drug use: No   Sexual activity: Not Currently    Birth control/protection: Post-menopausal  Other Topics Concern   Not on file  Social History Narrative   Not on file   Social Determinants of  [facility]   Financial Resource Strain: Low Risk  (07/04/2021)   Overall Financial Resource Strain (CARDIA)    Difficulty of Paying Living Expenses: Not hard at all  Food Insecurity: No Food Insecurity (07/04/2021)   Hunger Vital Sign    Worried About Running Out of Food in the Last Year: Never true    Ran Out of Food in the Last Year: Never true  Transportation Needs: No Transportation Needs (07/04/2021)   PRAPARE - Administrator, Civil Service (Medical): No    Lack of Transportation (Non-Medical): No  Physical Activity: Inactive (07/04/2021)   Exercise Vital Sign    Days of Exercise per Week: 0 days    Minutes of Exercise per Session: 0 min  Stress: No Stress Concern Present (07/04/2021)   Cynthia [facility] - Occupational Stress Questionnaire    Feeling of Stress : Not at all  Social Connections: Moderately Isolated (07/04/2021)   Social Connection and Isolation Panel [NHANES]    Frequency of Communication with Friends and Family: More than three times  a week    Frequency of Social Gatherings with Friends and Family: Once a week    Attends Religious Services: Never    Database administrator or Organizations: No    Attends Banker Meetings: Never    Marital Status: Married  Catering manager Violence: Not At Risk (07/04/2021)   Humiliation, Afraid, Rape, and Kick questionnaire    Fear of Current or Ex-Partner: No    Emotionally Abused: No    Physically Abused: No    Sexually Abused: No    Family History: Family History  Problem Relation Age of Onset   Hypertension Father    Cancer Father        stomach   Heart disease Mother    Colon cancer Brother    Suicidality Brother    Hypertension Brother    Arthritis Brother    Hypertension Brother    Arthritis Brother    Hypertension Brother    Arthritis Brother    Heart attack Brother    Suicidality Sister    Heart disease Other    Arthritis Other    Cancer Other     Current  Medications:  Current Outpatient Medications:    buPROPion (WELLBUTRIN XL) 150 MG 24 hr tablet, Take 3 tablets (450 mg total) by mouth daily., Disp: 270 tablet, Rfl: 4   clobetasol ointment (TEMOVATE) 0.05 %, Use as directed twice daily for 2 weeks to the affected area, then, use 2-3 times weekly., Disp: 30 g, Rfl: 3   dicyclomine (BENTYL) 10 MG capsule, Take 1 capsule (10 mg total) by mouth 2 (two) times daily before a meal. (Patient taking differently: Take 10 mg by mouth 2 (two) times daily as needed for spasms.), Disp: 60 capsule, Rfl: 5   levothyroxine (SYNTHROID) 150 MCG tablet, Take 1 tablet (150 mcg total) by mouth daily., Disp: 90 tablet, Rfl: 4   Obeticholic Acid (OCALIVA) 10 MG TABS, Take 10 mg by mouth daily., Disp: 90 tablet, Rfl: 3   ursodiol (ACTIGALL) 500 MG tablet, Take 1 tablet (500 mg total) by mouth 3 (three) times daily., Disp: 270 tablet, Rfl: 4  Current Facility-Administered Medications:    methylPREDNISolone acetate (DEPO-MEDROL) injection 40 mg, 40 mg, Intra-articular, Once, Vickki Hearing, MD   Allergies: Allergies  Allergen Reactions   Codeine Nausea And Vomiting    REVIEW OF SYSTEMS:   Review of Systems  Constitutional:  Negative for chills, fatigue and fever.  HENT:   Negative for lump/mass, mouth sores, nosebleeds, sore throat and trouble swallowing.   Eyes:  Negative for eye problems.  Respiratory:  Negative for cough and shortness of breath.   Cardiovascular:  Negative for chest pain, leg swelling and palpitations.  Gastrointestinal:  Negative for abdominal pain, constipation, diarrhea, nausea and vomiting.  Genitourinary:  Negative for bladder incontinence, difficulty urinating, dysuria, frequency, hematuria and nocturia.   Musculoskeletal:  Negative for arthralgias, back pain, flank pain, myalgias and neck pain.  Skin:  Negative for itching and rash.  Neurological:  Positive for dizziness. Negative for headaches and numbness.       +tingling  hands  Hematological:  Bruises/bleeds easily.  Psychiatric/Behavioral:  Negative for depression, sleep disturbance and suicidal ideas. The patient is not nervous/anxious.   All other systems reviewed and are negative.    VITALS:   There were no vitals taken for this visit.  Wt Readings from Last 3 Encounters:  08/04/22 237 lb 8 oz (107.7 kg)  06/09/22 233 lb (105.7 kg)  04/14/22 229 lb 8 oz (104.1 kg)    There is no height or weight on file to calculate BMI.   PHYSICAL EXAM:   Physical Exam Vitals and nursing note reviewed. Exam conducted with a chaperone present.  Constitutional:      Appearance: Normal appearance.  Cardiovascular:     Rate and Rhythm: Normal rate and regular rhythm.     Pulses: Normal pulses.     Heart sounds: Normal heart sounds.  Pulmonary:     Effort: Pulmonary effort is normal.     Breath sounds: Normal breath sounds.  Abdominal:     Palpations: Abdomen is soft. There is no hepatomegaly, splenomegaly or mass.     Tenderness: There is no abdominal tenderness.  Musculoskeletal:     Right lower leg: No edema.     Left lower leg: No edema.  Lymphadenopathy:     Cervical: No cervical adenopathy.     Right cervical: No superficial, deep or posterior cervical adenopathy.    Left cervical: No superficial, deep or posterior cervical adenopathy.     Upper Body:     Right upper body: No supraclavicular or axillary adenopathy.     Left upper body: No supraclavicular or axillary adenopathy.  Skin:    Comments: +Ecchymosis on right medial leg and left forearm  Neurological:     General: No focal deficit present.     Mental Status: She is alert and oriented to person, place, and time.  Psychiatric:        Mood and Affect: Mood normal.        Behavior: Behavior normal.     LABS:      Latest Ref Rng & Units 03/13/2023   11:40 AM 03/07/2022    1:55 PM 12/28/2021    1:01 AM  CBC  WBC 3.4 - 10.8 x10E3/uL 6.9  6.9  6.5   Hemoglobin 11.1 - 15.9 g/dL 84.1   32.4  8.3   Hematocrit 34.0 - 46.6 % 47.1  41.0  24.7   Platelets 150 - 450 x10E3/uL 33  224  153       Latest Ref Rng & Units 03/13/2023   11:40 AM 03/07/2022    1:55 PM 12/29/2021    1:14 AM  CMP  Glucose 70 - 99 mg/dL 91  93  97   BUN 8 - 27 mg/dL 16  13  12    Creatinine 0.57 - 1.00 mg/dL 4.01  0.27  2.53   Sodium 134 - 144 mmol/L 144  141  137   Potassium 3.5 - 5.2 mmol/L 3.9  4.4  3.3   Chloride 96 - 106 mmol/L 108  105  108   CO2 20 - 29 mmol/L 20  22  23    Calcium 8.7 - 10.3 mg/dL 9.1  9.5  8.5   Total Protein 6.0 - 8.5 g/dL 7.1  6.9    Total Bilirubin 0.0 - 1.2 mg/dL 0.6  0.5    Alkaline Phos 44 - 121 IU/L 156  142    AST 0 - 40 IU/L 30  41    ALT 0 - 32 IU/L 43  53       No results found for: "CEA1", "CEA" / No results found for: "CEA1", "CEA" No results found for: "PSA1" No results found for: "GUY403" No results found for: "CAN125"  No results found for: "TOTALPROTELP", "ALBUMINELP", "A1GS", "A2GS", "BETS", "BETA2SER", "GAMS", "MSPIKE", "SPEI" Lab Results  Component Value Date   TIBC 374 12/26/2021  FERRITIN 362 (H) 12/26/2021   IRONPCTSAT 35 (H) 12/26/2021   No results found for: "LDH"   STUDIES:   No results found.

## 2023-03-19 ENCOUNTER — Other Ambulatory Visit: Payer: Commercial Managed Care - PPO

## 2023-03-19 ENCOUNTER — Telehealth (INDEPENDENT_AMBULATORY_CARE_PROVIDER_SITE_OTHER): Payer: Self-pay | Admitting: Gastroenterology

## 2023-03-19 ENCOUNTER — Other Ambulatory Visit: Payer: Self-pay | Admitting: *Deleted

## 2023-03-19 ENCOUNTER — Other Ambulatory Visit: Payer: Self-pay

## 2023-03-19 ENCOUNTER — Encounter (INDEPENDENT_AMBULATORY_CARE_PROVIDER_SITE_OTHER): Payer: Self-pay

## 2023-03-19 ENCOUNTER — Inpatient Hospital Stay: Payer: Commercial Managed Care - PPO | Attending: Hematology | Admitting: Hematology

## 2023-03-19 VITALS — BP 136/87 | HR 70 | Temp 98.0°F | Resp 18 | Ht 69.0 in | Wt 240.3 lb

## 2023-03-19 DIAGNOSIS — K743 Primary biliary cirrhosis: Secondary | ICD-10-CM | POA: Insufficient documentation

## 2023-03-19 DIAGNOSIS — D696 Thrombocytopenia, unspecified: Secondary | ICD-10-CM

## 2023-03-19 DIAGNOSIS — Z800 Family history of malignant neoplasm of digestive organs: Secondary | ICD-10-CM | POA: Insufficient documentation

## 2023-03-19 DIAGNOSIS — Z87891 Personal history of nicotine dependence: Secondary | ICD-10-CM | POA: Diagnosis not present

## 2023-03-19 LAB — CBC WITH DIFFERENTIAL/PLATELET
Abs Immature Granulocytes: 0.01 10*3/uL (ref 0.00–0.07)
Basophils Absolute: 0 10*3/uL (ref 0.0–0.1)
Basophils Absolute: 0 10*3/uL (ref 0.0–0.1)
Basophils Relative: 1 %
Basophils Relative: 1 %
Eosinophils Absolute: 0.1 10*3/uL (ref 0.0–0.5)
Eosinophils Absolute: 0.1 10*3/uL (ref 0.0–0.5)
Eosinophils Relative: 2 %
Eosinophils Relative: 1 %
HCT: 44.4 % (ref 36.0–46.0)
HCT: 42.4 % (ref 36.0–46.0)
Hemoglobin: 14.6 g/dL (ref 12.0–15.0)
Hemoglobin: 14.3 g/dL (ref 12.0–15.0)
Immature Granulocytes: 0 %
Lymphocytes Relative: 17 %
Lymphocytes Relative: 23 %
Lymphs Abs: 1.2 10*3/uL (ref 0.7–4.0)
Lymphs Abs: 1.6 10*3/uL (ref 0.7–4.0)
MCH: 30.4 pg (ref 26.0–34.0)
MCH: 31 pg (ref 26.0–34.0)
MCHC: 32.9 g/dL (ref 30.0–36.0)
MCHC: 33.7 g/dL (ref 30.0–36.0)
MCV: 92.3 fL (ref 80.0–100.0)
MCV: 91.8 fL (ref 80.0–100.0)
Monocytes Absolute: 0.4 10*3/uL (ref 0.1–1.0)
Monocytes Absolute: 0.4 10*3/uL (ref 0.1–1.0)
Monocytes Relative: 6 %
Monocytes Relative: 5 %
Neutro Abs: 4.9 10*3/uL (ref 1.7–7.7)
Neutro Abs: 4.9 10*3/uL (ref 1.7–7.7)
Neutrophils Relative %: 74 %
Neutrophils Relative %: 70 %
Platelets: DECREASED 10*3/uL (ref 150–400)
Platelets: 34 10*3/uL — ABNORMAL LOW (ref 150–400)
RBC: 4.81 MIL/uL (ref 3.87–5.11)
RBC: 4.62 MIL/uL (ref 3.87–5.11)
RDW: 12.7 % (ref 11.5–15.5)
RDW: 12.8 % (ref 11.5–15.5)
Smear Review: DECREASED
WBC Morphology: REACTIVE
WBC: 6.7 10*3/uL (ref 4.0–10.5)
WBC: 7 10*3/uL (ref 4.0–10.5)
nRBC: 0 % (ref 0.0–0.2)
nRBC: 0 % (ref 0.0–0.2)

## 2023-03-19 LAB — RETICULOCYTES
Immature Retic Fract: 10.2 % (ref 2.3–15.9)
RBC.: 4.75 MIL/uL (ref 3.87–5.11)
Retic Count, Absolute: 111.6 10*3/uL (ref 19.0–186.0)
Retic Ct Pct: 2.4 % (ref 0.4–3.1)

## 2023-03-19 LAB — HEPATITIS B SURFACE ANTIGEN: Hepatitis B Surface Ag: NONREACTIVE

## 2023-03-19 LAB — SAVE SMEAR(SSMR), FOR PROVIDER SLIDE REVIEW

## 2023-03-19 LAB — HEPATITIS C ANTIBODY: HCV Ab: NONREACTIVE

## 2023-03-19 LAB — FOLATE: Folate: 10 ng/mL (ref >5.9)

## 2023-03-19 LAB — HEPATITIS B CORE ANTIBODY, TOTAL: Hep B Core Total Ab: NONREACTIVE

## 2023-03-19 LAB — VITAMIN B12: Vitamin B-12: 314 pg/mL (ref 180–914)

## 2023-03-19 LAB — LACTATE DEHYDROGENASE: LDH: 177 U/L (ref 98–192)

## 2023-03-19 LAB — IMMATURE PLATELET FRACTION: Immature Platelet Fraction: 11.9 % — ABNORMAL HIGH (ref 1.2–8.6)

## 2023-03-19 MED ORDER — DEXAMETHASONE 4 MG PO TABS
40.0000 mg | ORAL_TABLET | Freq: Two times a day (BID) | ORAL | 0 refills | Status: DC
Start: 2023-03-19 — End: 2023-05-18

## 2023-03-19 NOTE — Progress Notes (Signed)
Per Dr. Ellin Saba, a redraw of CBCD is required due to report of plt clumping.  Patient aware and will come in this afternoon.

## 2023-03-19 NOTE — Patient Instructions (Signed)
You were seen and examined today by Dr. Katragadda. Dr. Katragadda is a hematologist, meaning that he specializes in blood abnormalities. Dr. Katragadda discussed your past medical history, family history of cancers/blood conditions and the events that led to you being here today.  You were referred to Dr. Katragadda due to thrombocytopenia (low platelets).  Dr. Katragadda has recommended additional labs today for further evaluation.  Follow-up as scheduled.  

## 2023-03-19 NOTE — Telephone Encounter (Signed)
Thanks, please let her know I checked her platelet count and it is still low. Hopefully hematology can provide more insight into this Thanks

## 2023-03-19 NOTE — Telephone Encounter (Signed)
Patient called the office stated she is having blood work done today and she doesn't think she needs to do the blood work that you ordered-stated to let her know if there's anything else that needs to be done.

## 2023-03-20 LAB — RHEUMATOID FACTOR: Rheumatoid fact SerPl-aCnc: <10 [IU]/mL (ref <14.0)

## 2023-03-20 LAB — HEPATITIS B SURFACE ANTIBODY,QUALITATIVE: Hep B S Ab: REACTIVE — AB

## 2023-03-22 LAB — PROTEIN ELECTROPHORESIS, SERUM
A/G Ratio: 1.3 (ref 0.7–1.7)
Albumin ELP: 3.7 g/dL (ref 2.9–4.4)
Alpha-1-Globulin: 0.3 g/dL (ref 0.0–0.4)
Alpha-2-Globulin: 0.6 g/dL (ref 0.4–1.0)
Beta Globulin: 1.3 g/dL (ref 0.7–1.3)
Gamma Globulin: 0.7 g/dL (ref 0.4–1.8)
Globulin, Total: 2.9 g/dL (ref 2.2–3.9)
Total Protein ELP: 6.6 g/dL (ref 6.0–8.5)

## 2023-03-22 LAB — KAPPA/LAMBDA LIGHT CHAINS
Kappa free light chain: 19.5 mg/L — ABNORMAL HIGH (ref 3.3–19.4)
Kappa, lambda light chain ratio: 0.67 (ref 0.26–1.65)
Lambda free light chains: 29 mg/L — ABNORMAL HIGH (ref 5.7–26.3)

## 2023-03-22 LAB — ANTINUCLEAR ANTIBODIES, IFA: ANA Ab, IFA: NEGATIVE

## 2023-03-23 LAB — COPPER, SERUM: Copper: 142 ug/dL (ref 80–158)

## 2023-03-24 ENCOUNTER — Other Ambulatory Visit: Payer: Self-pay

## 2023-03-24 DIAGNOSIS — D696 Thrombocytopenia, unspecified: Secondary | ICD-10-CM

## 2023-03-24 NOTE — Progress Notes (Signed)
Lab entered

## 2023-03-25 ENCOUNTER — Inpatient Hospital Stay: Payer: Commercial Managed Care - PPO

## 2023-03-25 DIAGNOSIS — D696 Thrombocytopenia, unspecified: Secondary | ICD-10-CM

## 2023-03-25 DIAGNOSIS — Z800 Family history of malignant neoplasm of digestive organs: Secondary | ICD-10-CM | POA: Diagnosis not present

## 2023-03-25 DIAGNOSIS — K743 Primary biliary cirrhosis: Secondary | ICD-10-CM | POA: Diagnosis not present

## 2023-03-25 DIAGNOSIS — Z87891 Personal history of nicotine dependence: Secondary | ICD-10-CM | POA: Diagnosis not present

## 2023-03-25 LAB — CBC
HCT: 46.9 % — ABNORMAL HIGH (ref 36.0–46.0)
Hemoglobin: 15.7 g/dL — ABNORMAL HIGH (ref 12.0–15.0)
MCH: 30.8 pg (ref 26.0–34.0)
MCHC: 33.5 g/dL (ref 30.0–36.0)
MCV: 92 fL (ref 80.0–100.0)
Platelets: 177 10*3/uL (ref 150–400)
RBC: 5.1 MIL/uL (ref 3.87–5.11)
RDW: 12.8 % (ref 11.5–15.5)
WBC: 13.1 10*3/uL — ABNORMAL HIGH (ref 4.0–10.5)
nRBC: 0 % (ref 0.0–0.2)

## 2023-03-25 LAB — IMMUNOFIXATION ELECTROPHORESIS
IgA: 156 mg/dL (ref 87–352)
IgG (Immunoglobin G), Serum: 830 mg/dL (ref 586–1602)
IgM (Immunoglobulin M), Srm: 280 mg/dL — ABNORMAL HIGH (ref 26–217)
Total Protein ELP: 6.6 g/dL (ref 6.0–8.5)

## 2023-03-26 ENCOUNTER — Inpatient Hospital Stay: Payer: Commercial Managed Care - PPO

## 2023-03-27 LAB — METHYLMALONIC ACID, SERUM: Methylmalonic Acid, Quantitative: 289 nmol/L (ref 0–378)

## 2023-03-27 NOTE — Progress Notes (Signed)
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 please have her platelets checked again on 04/07/23. Thanks.   Patient aware and agreeable with plan. Appointment scheduled for 04/07/23 @ 10:00 AM.

## 2023-04-04 DIAGNOSIS — G4733 Obstructive sleep apnea (adult) (pediatric): Secondary | ICD-10-CM | POA: Diagnosis not present

## 2023-04-06 ENCOUNTER — Other Ambulatory Visit: Payer: Self-pay

## 2023-04-06 DIAGNOSIS — D696 Thrombocytopenia, unspecified: Secondary | ICD-10-CM

## 2023-04-07 ENCOUNTER — Other Ambulatory Visit: Payer: Self-pay

## 2023-04-07 ENCOUNTER — Inpatient Hospital Stay: Payer: Commercial Managed Care - PPO | Attending: Hematology

## 2023-04-07 DIAGNOSIS — D696 Thrombocytopenia, unspecified: Secondary | ICD-10-CM | POA: Diagnosis not present

## 2023-04-07 DIAGNOSIS — Z79899 Other long term (current) drug therapy: Secondary | ICD-10-CM | POA: Diagnosis not present

## 2023-04-07 LAB — CBC
HCT: 42.6 % (ref 36.0–46.0)
Hemoglobin: 14.6 g/dL (ref 12.0–15.0)
MCH: 31.2 pg (ref 26.0–34.0)
MCHC: 34.3 g/dL (ref 30.0–36.0)
MCV: 91 fL (ref 80.0–100.0)
Platelets: 5 10*3/uL — CL (ref 150–400)
RBC: 4.68 MIL/uL (ref 3.87–5.11)
RDW: 12.9 % (ref 11.5–15.5)
WBC: 7.5 10*3/uL (ref 4.0–10.5)
nRBC: 0 % (ref 0.0–0.2)

## 2023-04-07 NOTE — Progress Notes (Unsigned)
CRITICAL VALUE ALERT Critical value received:  platelet of 5,000 Date of notification:  04-07-23 Time of notification: 1104 Critical value read back:  Yes.   Nurse who received alert:  C. PageRN MD notified time and response:  Dr. Ellin Saba   1141. IVIG ordered per MD.

## 2023-04-08 ENCOUNTER — Inpatient Hospital Stay: Payer: Commercial Managed Care - PPO

## 2023-04-08 VITALS — BP 137/85 | HR 70 | Temp 97.5°F | Resp 18 | Wt 240.2 lb

## 2023-04-08 DIAGNOSIS — Z79899 Other long term (current) drug therapy: Secondary | ICD-10-CM | POA: Diagnosis not present

## 2023-04-08 DIAGNOSIS — D696 Thrombocytopenia, unspecified: Secondary | ICD-10-CM | POA: Diagnosis not present

## 2023-04-08 MED ORDER — ACETAMINOPHEN 325 MG PO TABS
650.0000 mg | ORAL_TABLET | Freq: Once | ORAL | Status: AC
  Administered 2023-04-08: 650 mg via ORAL
  Filled 2023-04-08: qty 2

## 2023-04-08 MED ORDER — DEXTROSE 5 % IV SOLN
INTRAVENOUS | Status: DC
Start: 2023-04-08 — End: 2023-04-08

## 2023-04-08 MED ORDER — FAMOTIDINE IN NACL 20-0.9 MG/50ML-% IV SOLN
20.0000 mg | Freq: Once | INTRAVENOUS | Status: AC
  Administered 2023-04-08: 20 mg via INTRAVENOUS
  Filled 2023-04-08: qty 50

## 2023-04-08 MED ORDER — METHYLPREDNISOLONE SODIUM SUCC 125 MG IJ SOLR
125.0000 mg | Freq: Once | INTRAMUSCULAR | Status: AC
Start: 2023-04-08 — End: 2023-04-08
  Administered 2023-04-08: 125 mg via INTRAVENOUS
  Filled 2023-04-08: qty 2

## 2023-04-08 MED ORDER — CETIRIZINE HCL 10 MG/ML IV SOLN
10.0000 mg | Freq: Once | INTRAVENOUS | Status: AC
  Administered 2023-04-08: 10 mg via INTRAVENOUS
  Filled 2023-04-08: qty 1

## 2023-04-08 MED ORDER — IMMUNE GLOBULIN (HUMAN) 10 GM/100ML IV SOLN
1.0000 g/kg | Freq: Once | INTRAVENOUS | Status: AC
  Administered 2023-04-08: 85 g via INTRAVENOUS
  Filled 2023-04-08: qty 850

## 2023-04-08 NOTE — Patient Instructions (Signed)
[facility] - A DEPT OF [facility]  Discharge Instructions: Thank you for choosing [facility] to provide your oncology and hematology care.  If you have a lab appointment with the Cancer Center - please note that after April 8th, 2024, all labs will be drawn in the cancer center.  You do not have to check in or register with the main entrance as you have in the past but will complete your check-in in the cancer center.  Wear comfortable clothing and clothing appropriate for easy access to any Portacath or PICC line.   We strive to give you quality time with your provider. You may need to reschedule your appointment if you arrive late (15 or more minutes).  Arriving late affects you and other patients whose appointments are after yours.  Also, if you miss three or more appointments without notifying the office, you may be dismissed from the clinic at the provider's discretion.      For prescription refill requests, have your pharmacy contact our office and allow 72 hours for refills to be completed.    Today you received the following chemotherapy and/or immunotherapy agents IVIG      To help prevent nausea and vomiting after your treatment, we encourage you to take your nausea medication as directed.  BELOW ARE SYMPTOMS THAT SHOULD BE REPORTED IMMEDIATELY: *FEVER GREATER THAN 100.4 F (38 C) OR HIGHER *CHILLS OR SWEATING *NAUSEA AND VOMITING THAT IS NOT CONTROLLED WITH YOUR NAUSEA MEDICATION *UNUSUAL SHORTNESS OF BREATH *UNUSUAL BRUISING OR BLEEDING *URINARY PROBLEMS (pain or burning when urinating, or frequent urination) *BOWEL PROBLEMS (unusual diarrhea, constipation, pain near the anus) TENDERNESS IN MOUTH AND THROAT WITH OR WITHOUT PRESENCE OF ULCERS (sore throat, sores in mouth, or a toothache) UNUSUAL RASH, SWELLING OR PAIN  UNUSUAL VAGINAL DISCHARGE OR ITCHING   Items with * indicate a potential emergency and should be followed up as  soon as possible or go to the Emergency Department if any problems should occur.  Please show the CHEMOTHERAPY ALERT CARD or IMMUNOTHERAPY ALERT CARD at check-in to the Emergency Department and triage nurse.  Should you have questions after your visit or need to cancel or reschedule your appointment, please contact [facility] - A DEPT OF [facility] [phone]  and follow the prompts.  Office hours are 8:00 a.m. to 4:30 p.m. Monday - Friday. Please note that voicemails left after 4:00 p.m. may not be returned until the following business day.  We are closed weekends and major holidays. You have access to a nurse at all times for urgent questions. Please call the main number to the clinic [phone] and follow the prompts.  For any non-urgent questions, you may also contact your provider using MyChart. We now offer e-Visits for anyone 39 and older to request care online for non-urgent symptoms. For details visit [email or web address].   Also download the MyChart app! Go to the app store, search "MyChart", open the app, select [facility], and log in with your MyChart username and password.

## 2023-04-08 NOTE — Progress Notes (Signed)
Patient tolerated therapy with no complaints voiced.  Peripheral IV site clean and dry with no bruising or swelling noted at site.  Good blood return noted before and after administration of therapy.  Band aid applied.  Patient left in satisfactory condition with VSS and no s/s of distress noted.

## 2023-04-09 ENCOUNTER — Inpatient Hospital Stay: Payer: Commercial Managed Care - PPO

## 2023-04-09 VITALS — BP 134/84 | HR 66 | Temp 98.0°F | Resp 18 | Wt 240.0 lb

## 2023-04-09 DIAGNOSIS — D696 Thrombocytopenia, unspecified: Secondary | ICD-10-CM | POA: Diagnosis not present

## 2023-04-09 DIAGNOSIS — Z79899 Other long term (current) drug therapy: Secondary | ICD-10-CM | POA: Diagnosis not present

## 2023-04-09 MED ORDER — IMMUNE GLOBULIN (HUMAN) 10 GM/100ML IV SOLN
1.0000 g/kg | Freq: Once | INTRAVENOUS | Status: AC
  Administered 2023-04-09: 85 g via INTRAVENOUS
  Filled 2023-04-09: qty 850

## 2023-04-09 MED ORDER — CETIRIZINE HCL 10 MG/ML IV SOLN
10.0000 mg | Freq: Once | INTRAVENOUS | Status: AC
  Administered 2023-04-09: 10 mg via INTRAVENOUS
  Filled 2023-04-09: qty 1

## 2023-04-09 MED ORDER — ACETAMINOPHEN 325 MG PO TABS
650.0000 mg | ORAL_TABLET | Freq: Once | ORAL | Status: AC
  Administered 2023-04-09: 650 mg via ORAL
  Filled 2023-04-09: qty 2

## 2023-04-09 MED ORDER — FAMOTIDINE IN NACL 20-0.9 MG/50ML-% IV SOLN
20.0000 mg | Freq: Once | INTRAVENOUS | Status: AC
  Administered 2023-04-09: 20 mg via INTRAVENOUS
  Filled 2023-04-09: qty 50

## 2023-04-09 MED ORDER — METHYLPREDNISOLONE SODIUM SUCC 125 MG IJ SOLR
125.0000 mg | Freq: Once | INTRAMUSCULAR | Status: AC
  Administered 2023-04-09: 125 mg via INTRAVENOUS
  Filled 2023-04-09: qty 2

## 2023-04-09 MED ORDER — DEXTROSE 5 % IV SOLN: INTRAVENOUS | Status: DC

## 2023-04-09 NOTE — Progress Notes (Signed)
Patient presents today for IVIG infusion.  Patient is in satisfactory condition with no new complaints voiced.  Vital signs are stable.  IV placed in L arm.  IV flushed well with good blood return noted.  We will proceed with infusion per provider orders.    Patient tolerated treatment well with no complaints voiced.  Patient left ambulatory in stable condition.  Vital signs stable at discharge.  Follow up as scheduled.

## 2023-04-09 NOTE — Patient Instructions (Signed)
[facility] - A DEPT OF [facility]  Discharge Instructions: Thank you for choosing [facility] to provide your oncology and hematology care.  If you have a lab appointment with the Cancer Center - please note that after April 8th, 2024, all labs will be drawn in the cancer center.  You do not have to check in or register with the main entrance as you have in the past but will complete your check-in in the cancer center.  Wear comfortable clothing and clothing appropriate for easy access to any Portacath or PICC line.   We strive to give you quality time with your provider. You may need to reschedule your appointment if you arrive late (15 or more minutes).  Arriving late affects you and other patients whose appointments are after yours.  Also, if you miss three or more appointments without notifying the office, you may be dismissed from the clinic at the provider's discretion.      For prescription refill requests, have your pharmacy contact our office and allow 72 hours for refills to be completed.    Today you received the following chemotherapy and/or immunotherapy agents Privigen.  Immune Globulin Injection What is this medication? IMMUNE GLOBULIN (im MUNE  GLOB yoo lin) helps to prevent or reduce the severity of certain infections in patients who are at risk. This medicine is collected from the pooled blood of many donors. It is used to treat immune system problems, thrombocytopenia, and Kawasaki syndrome. This medicine may be used for other purposes; ask your health care provider or pharmacist if you have questions. This medicine may be used for other purposes; ask your health care provider or pharmacist if you have questions. COMMON BRAND NAME(S): ASCENIV, Baygam, BIVIGAM, Carimune, Carimune NF, cutaquig, Cuvitru, Flebogamma, Flebogamma DIF, GamaSTAN, GamaSTAN S/D, Gamimune N, Gammagard, Gammagard S/D, Gammaked, Gammaplex, Gammar-P IV, Gamunex,  Gamunex-C, Hizentra, Iveegam, Iveegam EN, Octagam, Panglobulin, Panglobulin NF, panzyga, Polygam S/D, Privigen, Sandoglobulin, Venoglobulin-S, Vigam, Vivaglobulin, Xembify What should I tell my care team before I take this medication? They need to know if you have any of these conditions: diabetes extremely low or no immune antibodies in the blood heart disease history of blood clots hyperprolinemia infection in the blood, sepsis kidney disease recently received or scheduled to receive a vaccination an unusual or allergic reaction to human immune globulin, albumin, maltose, sucrose, other medicines, foods, dyes, or preservatives pregnant or trying to get pregnant breast-feeding How should I use this medication? This medicine is for injection into a muscle or infusion into a vein or skin. It is usually given by a health care professional in a hospital or clinic setting. In rare cases, some brands of this medicine might be given at home. You will be taught how to give this medicine. Use exactly as directed. Take your medicine at regular intervals. Do not take your medicine more often than directed. Talk to your pediatrician regarding the use of this medicine in children. While this drug may be prescribed for selected conditions, precautions do apply. Overdosage: If you think you have taken too much of this medicine contact a poison control center or emergency room at once. NOTE: This medicine is only for you. Do not share this medicine with others. Overdosage: If you think you have taken too much of this medicine contact a poison control center or emergency room at once. NOTE: This medicine is only for you. Do not share this medicine with others. What if I  miss a dose? It is important not to miss your dose. Call your doctor or health care professional if you are unable to keep an appointment. If you give yourself the medicine and you miss a dose, take it as soon as you can. If it is almost time  for your next dose, take only that dose. Do not take double or extra doses. What may interact with this medication? aspirin and aspirin-like medicines cisplatin cyclosporine medicines for infection like acyclovir, adefovir, amphotericin B, bacitracin, cidofovir, foscarnet, ganciclovir, gentamicin, pentamidine, vancomycin NSAIDS, medicines for pain and inflammation, like ibuprofen or naproxen pamidronate vaccines zoledronic acid This list may not describe all possible interactions. Give your health care provider a list of all the medicines, herbs, non-prescription drugs, or dietary supplements you use. Also tell them if you smoke, drink alcohol, or use illegal drugs. Some items may interact with your medicine. This list may not describe all possible interactions. Give your health care provider a list of all the medicines, herbs, non-prescription drugs, or dietary supplements you use. Also tell them if you smoke, drink alcohol, or use illegal drugs. Some items may interact with your medicine. What should I watch for while using this medication? Your condition will be monitored carefully while you are receiving this medicine. This medicine is made from pooled blood donations of many different people. It may be possible to pass an infection in this medicine. However, the donors are screened for infections and all products are tested for HIV and hepatitis. The medicine is treated to kill most or all bacteria and viruses. Talk to your doctor about the risks and benefits of this medicine. Do not have vaccinations for at least 14 days before, or until at least 3 months after receiving this medicine. What side effects may I notice from receiving this medication? Side effects that you should report to your doctor or health care professional as soon as possible: allergic reactions like skin rash, itching or hives, swelling of the face, lips, or tongue blue colored lips or skin breathing problems chest pain  or tightness fever signs and symptoms of aseptic meningitis such as stiff neck; sensitivity to light; headache; drowsiness; fever; nausea; vomiting; rash signs and symptoms of a blood clot such as chest pain; shortness of breath; pain, swelling, or warmth in the leg signs and symptoms of hemolytic anemia such as fast heartbeat; tiredness; dark yellow or Cynthia Donaldson urine; or yellowing of the eyes or skin signs and symptoms of kidney injury like trouble passing urine or change in the amount of urine sudden weight gain swelling of the ankles, feet, hands Side effects that usually do not require medical attention (report to your doctor or health care professional if they continue or are bothersome): diarrhea flushing headache increased sweating joint pain muscle cramps muscle pain nausea pain, redness, or irritation at site where injected tiredness This list may not describe all possible side effects. Call your doctor for medical advice about side effects. You may report side effects to FDA at 1-800-FDA-1088. This list may not describe all possible side effects. Call your doctor for medical advice about side effects. You may report side effects to FDA at 1-800-FDA-1088. Where should I keep my medication? Keep out of the reach of children. This drug is usually given in a hospital or clinic and will not be stored at home. In rare cases, some brands of this medicine may be given at home. If you are using this medicine at home, you will be instructed on  how to store this medicine. Throw away any unused medicine after the expiration date on the label. NOTE: This sheet is a summary. It may not cover all possible information. If you have questions about this medicine, talk to your doctor, pharmacist, or health care provider.  2024 Elsevier/Gold Standard (2018-12-15 00:00:00)        To help prevent nausea and vomiting after your treatment, we encourage you to take your nausea medication as  directed.  BELOW ARE SYMPTOMS THAT SHOULD BE REPORTED IMMEDIATELY: *FEVER GREATER THAN 100.4 F (38 C) OR HIGHER *CHILLS OR SWEATING *NAUSEA AND VOMITING THAT IS NOT CONTROLLED WITH YOUR NAUSEA MEDICATION *UNUSUAL SHORTNESS OF BREATH *UNUSUAL BRUISING OR BLEEDING *URINARY PROBLEMS (pain or burning when urinating, or frequent urination) *BOWEL PROBLEMS (unusual diarrhea, constipation, pain near the anus) TENDERNESS IN MOUTH AND THROAT WITH OR WITHOUT PRESENCE OF ULCERS (sore throat, sores in mouth, or a toothache) UNUSUAL RASH, SWELLING OR PAIN  UNUSUAL VAGINAL DISCHARGE OR ITCHING   Items with * indicate a potential emergency and should be followed up as soon as possible or go to the Emergency Department if any problems should occur.  Please show the CHEMOTHERAPY ALERT CARD or IMMUNOTHERAPY ALERT CARD at check-in to the Emergency Department and triage nurse.  Should you have questions after your visit or need to cancel or reschedule your appointment, please contact [facility] - A DEPT OF [facility] [phone]  and follow the prompts.  Office hours are 8:00 a.m. to 4:30 p.m. Monday - Friday. Please note that voicemails left after 4:00 p.m. may not be returned until the following business day.  We are closed weekends and major holidays. You have access to a nurse at all times for urgent questions. Please call the main number to the clinic [phone] and follow the prompts.  For any non-urgent questions, you may also contact your provider using MyChart. We now offer e-Visits for anyone 43 and older to request care online for non-urgent symptoms. For details visit [email or web address].   Also download the MyChart app! Go to the app store, search "MyChart", open the app, select [facility], and log in with your MyChart username and password.

## 2023-04-11 DIAGNOSIS — R110 Nausea: Secondary | ICD-10-CM | POA: Diagnosis not present

## 2023-04-11 DIAGNOSIS — Z1152 Encounter for screening for COVID-19: Secondary | ICD-10-CM | POA: Diagnosis not present

## 2023-04-11 DIAGNOSIS — R519 Headache, unspecified: Secondary | ICD-10-CM | POA: Diagnosis not present

## 2023-04-11 DIAGNOSIS — G44209 Tension-type headache, unspecified, not intractable: Secondary | ICD-10-CM | POA: Diagnosis not present

## 2023-04-13 ENCOUNTER — Inpatient Hospital Stay: Payer: Commercial Managed Care - PPO

## 2023-04-13 ENCOUNTER — Telehealth: Payer: Self-pay

## 2023-04-13 NOTE — Telephone Encounter (Signed)
Patients husband called to let RN know , she flew out this weekend to a wedding after 2 days of IVIG. Patient became nauseated and vomiting, severe headache, ended up in the Emergency room for treatment. Labs done, Dr. Jena Gauss will be faxing those to clinic. Patient came home by car. Patient is home sleeping , nausea is better, still has headache. Patient not able to come to lab appt today. Will reschedule her for later this week per Chapman Moss RN.

## 2023-04-14 DIAGNOSIS — G4733 Obstructive sleep apnea (adult) (pediatric): Secondary | ICD-10-CM | POA: Diagnosis not present

## 2023-04-15 ENCOUNTER — Other Ambulatory Visit: Payer: Self-pay

## 2023-04-15 DIAGNOSIS — D696 Thrombocytopenia, unspecified: Secondary | ICD-10-CM

## 2023-04-16 ENCOUNTER — Inpatient Hospital Stay: Payer: Commercial Managed Care - PPO

## 2023-04-16 DIAGNOSIS — D696 Thrombocytopenia, unspecified: Secondary | ICD-10-CM

## 2023-04-16 DIAGNOSIS — Z79899 Other long term (current) drug therapy: Secondary | ICD-10-CM | POA: Diagnosis not present

## 2023-04-16 LAB — CBC
HCT: 40 % (ref 36.0–46.0)
Hemoglobin: 13.7 g/dL (ref 12.0–15.0)
MCH: 31.4 pg (ref 26.0–34.0)
MCHC: 34.3 g/dL (ref 30.0–36.0)
MCV: 91.7 fL (ref 80.0–100.0)
Platelets: 430 10*3/uL — ABNORMAL HIGH (ref 150–400)
RBC: 4.36 MIL/uL (ref 3.87–5.11)
RDW: 13.5 % (ref 11.5–15.5)
WBC: 7.4 10*3/uL (ref 4.0–10.5)
nRBC: 0 % (ref 0.0–0.2)

## 2023-04-21 ENCOUNTER — Other Ambulatory Visit: Payer: Self-pay

## 2023-04-21 DIAGNOSIS — D696 Thrombocytopenia, unspecified: Secondary | ICD-10-CM

## 2023-04-21 NOTE — Progress Notes (Signed)
Lab orders entered

## 2023-04-22 ENCOUNTER — Inpatient Hospital Stay: Payer: Commercial Managed Care - PPO

## 2023-04-22 DIAGNOSIS — Z79899 Other long term (current) drug therapy: Secondary | ICD-10-CM | POA: Diagnosis not present

## 2023-04-22 DIAGNOSIS — D696 Thrombocytopenia, unspecified: Secondary | ICD-10-CM | POA: Diagnosis not present

## 2023-04-22 LAB — CBC
HCT: 40.2 % (ref 36.0–46.0)
Hemoglobin: 13.4 g/dL (ref 12.0–15.0)
MCH: 30.9 pg (ref 26.0–34.0)
MCHC: 33.3 g/dL (ref 30.0–36.0)
MCV: 92.6 fL (ref 80.0–100.0)
Platelets: 164 10*3/uL (ref 150–400)
RBC: 4.34 MIL/uL (ref 3.87–5.11)
RDW: 13.9 % (ref 11.5–15.5)
WBC: 5 10*3/uL (ref 4.0–10.5)
nRBC: 0 % (ref 0.0–0.2)

## 2023-04-28 ENCOUNTER — Other Ambulatory Visit: Payer: Self-pay

## 2023-04-28 DIAGNOSIS — D696 Thrombocytopenia, unspecified: Secondary | ICD-10-CM

## 2023-04-29 ENCOUNTER — Inpatient Hospital Stay: Payer: Commercial Managed Care - PPO | Attending: Hematology

## 2023-04-29 DIAGNOSIS — Z800 Family history of malignant neoplasm of digestive organs: Secondary | ICD-10-CM | POA: Diagnosis not present

## 2023-04-29 DIAGNOSIS — Z87891 Personal history of nicotine dependence: Secondary | ICD-10-CM | POA: Insufficient documentation

## 2023-04-29 DIAGNOSIS — K743 Primary biliary cirrhosis: Secondary | ICD-10-CM | POA: Insufficient documentation

## 2023-04-29 DIAGNOSIS — D696 Thrombocytopenia, unspecified: Secondary | ICD-10-CM

## 2023-04-29 DIAGNOSIS — D693 Immune thrombocytopenic purpura: Secondary | ICD-10-CM | POA: Insufficient documentation

## 2023-04-29 LAB — CBC
HCT: 43 % (ref 36.0–46.0)
Hemoglobin: 14.4 g/dL (ref 12.0–15.0)
MCH: 31.8 pg (ref 26.0–34.0)
MCHC: 33.5 g/dL (ref 30.0–36.0)
MCV: 94.9 fL (ref 80.0–100.0)
Platelets: 158 10*3/uL (ref 150–400)
RBC: 4.53 MIL/uL (ref 3.87–5.11)
RDW: 14.6 % (ref 11.5–15.5)
WBC: 6.7 10*3/uL (ref 4.0–10.5)
nRBC: 0 % (ref 0.0–0.2)

## 2023-05-04 DIAGNOSIS — G4733 Obstructive sleep apnea (adult) (pediatric): Secondary | ICD-10-CM | POA: Diagnosis not present

## 2023-05-15 ENCOUNTER — Other Ambulatory Visit: Payer: Self-pay

## 2023-05-15 DIAGNOSIS — D696 Thrombocytopenia, unspecified: Secondary | ICD-10-CM

## 2023-05-18 ENCOUNTER — Inpatient Hospital Stay: Payer: Commercial Managed Care - PPO

## 2023-05-18 ENCOUNTER — Inpatient Hospital Stay: Payer: Commercial Managed Care - PPO | Admitting: Hematology

## 2023-05-18 VITALS — BP 136/83 | HR 75 | Temp 98.3°F | Resp 18 | Wt 241.0 lb

## 2023-05-18 DIAGNOSIS — D693 Immune thrombocytopenic purpura: Secondary | ICD-10-CM | POA: Insufficient documentation

## 2023-05-18 DIAGNOSIS — Z800 Family history of malignant neoplasm of digestive organs: Secondary | ICD-10-CM | POA: Diagnosis not present

## 2023-05-18 DIAGNOSIS — Z87891 Personal history of nicotine dependence: Secondary | ICD-10-CM | POA: Diagnosis not present

## 2023-05-18 DIAGNOSIS — K743 Primary biliary cirrhosis: Secondary | ICD-10-CM | POA: Diagnosis not present

## 2023-05-18 DIAGNOSIS — D696 Thrombocytopenia, unspecified: Secondary | ICD-10-CM

## 2023-05-18 LAB — CBC
HCT: 42.5 % (ref 36.0–46.0)
Hemoglobin: 14 g/dL (ref 12.0–15.0)
MCH: 31.5 pg (ref 26.0–34.0)
MCHC: 32.9 g/dL (ref 30.0–36.0)
MCV: 95.5 fL (ref 80.0–100.0)
Platelets: 198 10*3/uL (ref 150–400)
RBC: 4.45 MIL/uL (ref 3.87–5.11)
RDW: 13.6 % (ref 11.5–15.5)
WBC: 6.3 10*3/uL (ref 4.0–10.5)
nRBC: 0 % (ref 0.0–0.2)

## 2023-05-18 NOTE — Progress Notes (Signed)
[facility] [address] [place] [address]    Clinic Day:  05/18/2023  Referring physician: Carylon Perches, MD  Patient Care Team: Carylon Perches, MD as PCP - General (Internal Medicine)   ASSESSMENT & PLAN:   Assessment: 1.  Immune mediated thrombocytopenia: - Patient seen at the request of Dr. Levon Hedger - CBC on 03/13/2023: PLT-33, Hb-15.7, WBC-6.9. 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 - She denies drinking tonic water/using quinine supplements. - Reports easy bruising on the extremities for the last 2 months.  No active bleeding.  No recent infections or antibiotic use. - Dexamethasone trial (40 mg daily x 4 days) on 03/19/2023 with platelet improvement from 34 to 177, with drop to 5 on 04/07/2023, transient response. - IVIG 1 g/kg on 04/08/2023 on 04/09/2023   2. Social/Family History:  -Retired. No chemical exposure. Quit tobacco use 2 years ago with 2 cigarettes a day  and off since age 10. -No family history of decreased platelets. Brother died of colon cancer. Father had stomach cancer. Brother has discoid lupus.  No family history of DVT/PE.    Plan: 1.  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 - She has tolerated IVIG very well. - Today platelet count is 198.  Rest of the CBC was normal. - We talked about second line therapy options including TPO mimetics (eltrombopag, avatrombopag) slightly increased to thrombosis risk, Syk inhibitor fostamatinib (diarrhea and hypertension), and rituximab (infusion reaction and infection risk). - We discussed merits and complications of the above treatment options. - We have also discussed newer trials showing TPO agonists and fostamatinib can be discontinued after very good response after 3 months. - She does not require any treatment at this time as her platelet count is normal  today.  We will monitor her CBC once a month.  RTC 4 months for follow-up.    No orders of the defined types were placed in this encounter.     I,Katie Daubenspeck,acting as a Neurosurgeon for Doreatha Massed, MD.,have documented all relevant documentation on the behalf of Doreatha Massed, MD,as directed by  Doreatha Massed, MD while in the presence of Doreatha Massed, MD.   I, Doreatha Massed MD, have reviewed the above documentation for accuracy and completeness, and I agree with the above.   Doreatha Massed, MD   12/23/20245:06 PM  CHIEF COMPLAINT:   Diagnosis: Severe thrombocytopenia    Cancer Staging  No matching staging information was found for the patient.    Prior Therapy: Trial of dexamethasone   Current Therapy:  IVIG   HISTORY OF PRESENT ILLNESS:   Oncology History   No history exists.     INTERVAL HISTORY:   Merial is a 67 y.o. female presenting to clinic today for follow up of Severe thrombocytopenia. She was last seen by me on 03/19/23 in consultation.  Today, she states that she is doing well overall. Her appetite level is at 75%. Her energy level is at 25%.  PAST MEDICAL HISTORY:   Past Medical History: Past Medical History:  Diagnosis Date   Bile duct stenosis    Hyperlipidemia    Hypothyroidism    PONV (postoperative nausea and vomiting)    Sleep apnea     Surgical History: Past Surgical History:  Procedure Laterality Date   anal fissure repair     BILIARY DILATION  12/26/2021  Procedure: BILIARY DILATION;  Surgeon: Meridee Score Netty Starring., MD;  Location: [facility] ENDOSCOPY;  Service: Gastroenterology;;   BIOPSY  12/26/2021   Procedure: BIOPSY;  Surgeon: Lemar Lofty., MD;  Location: [facility] ENDOSCOPY;  Service: Gastroenterology;;   CHOLECYSTECTOMY     COLONOSCOPY N/A 12/02/2017   Procedure: COLONOSCOPY;  Surgeon: Malissa Hippo, MD;  Location: [facility] ENDO SUITE;  Service: Endoscopy;  Laterality: N/A;  1030   ERCP      X3    ERCP N/A 12/26/2021   Procedure: ENDOSCOPIC RETROGRADE CHOLANGIOPANCREATOGRAPHY (ERCP);  Surgeon: Lemar Lofty., MD;  Location: [facility] ENDOSCOPY;  Service: Gastroenterology;  Laterality: N/A;   ESOPHAGOGASTRODUODENOSCOPY N/A 12/26/2021   Procedure: ESOPHAGOGASTRODUODENOSCOPY (EGD);  Surgeon: Lemar Lofty., MD;  Location: [facility] ENDOSCOPY;  Service: Gastroenterology;  Laterality: N/A;   MYOMECTOMY     REMOVAL OF STONES  12/26/2021   Procedure: REMOVAL OF STONES;  Surgeon: Mansouraty, Netty Starring., MD;  Location: [facility] ENDOSCOPY;  Service: Gastroenterology;;   Dennison Mascot  12/26/2021   Procedure: Dennison Mascot;  Surgeon: Mansouraty, Netty Starring., MD;  Location: [facility] ENDOSCOPY;  Service: Gastroenterology;;   TONSILLECTOMY      Social History: Social History   Socioeconomic History   Marital status: Married    Spouse name: Not on file   Number of children: Not on file   Years of education: Not on file   Highest education level: Not on file  Occupational History   Occupation: Homemaker  Tobacco Use   Smoking status: Former    Current packs/day: 0.00    Types: Cigarettes    Quit date: 2022    Years since quitting: 2.9   Smokeless tobacco: Never   Tobacco comments:    07/2019  Vaping Use   Vaping status: Never Used  Substance and Sexual Activity   Alcohol use: Yes    Comment: monthly   Drug use: No   Sexual activity: Not Currently    Birth control/protection: Post-menopausal  Other Topics Concern   Not on file  Social History Narrative   Not on file   Social Drivers of Health   Financial Resource Strain: Low Risk  (07/04/2021)   Overall Financial Resource Strain (CARDIA)    Difficulty of Paying Living Expenses: Not hard at all  Food Insecurity: No Food Insecurity (07/04/2021)   Hunger Vital Sign    Worried About Running Out of Food in the Last Year: Never true    Ran Out of Food in the Last Year: Never true  Transportation Needs: No Transportation Needs (07/04/2021)    PRAPARE - Administrator, Civil Service (Medical): No    Lack of Transportation (Non-Medical): No  Physical Activity: Inactive (07/04/2021)   Exercise Vital Sign    Days of Exercise per Week: 0 days    Minutes of Exercise per Session: 0 min  Stress: No Stress Concern Present (07/04/2021)   [employer] of Occupational Health - Occupational Stress Questionnaire    Feeling of Stress : Not at all  Social Connections: Moderately Isolated (07/04/2021)   Social Connection and Isolation Panel [NHANES]    Frequency of Communication with Friends and Family: More than three times a week    Frequency of Social Gatherings with Friends and Family: Once a week    Attends Religious Services: Never    Database administrator or Organizations: No    Attends Banker Meetings: Never    Marital Status: Married  Catering manager Violence: Not At Risk (07/04/2021)   Humiliation,  Afraid, Rape, and Kick questionnaire    Fear of Current or Ex-Partner: No    Emotionally Abused: No    Physically Abused: No    Sexually Abused: No    Family History: Family History  Problem Relation Age of Onset   Hypertension Father    Cancer Father        stomach   Heart disease Mother    Colon cancer Brother    Suicidality Brother    Hypertension Brother    Arthritis Brother    Hypertension Brother    Arthritis Brother    Hypertension Brother    Arthritis Brother    Heart attack Brother    Suicidality Sister    Heart disease Other    Arthritis Other    Cancer Other     Current Medications:  Current Outpatient Medications:    buPROPion (WELLBUTRIN XL) 150 MG 24 hr tablet, Take 3 tablets (450 mg total) by mouth daily., Disp: 270 tablet, Rfl: 4   clobetasol ointment (TEMOVATE) 0.05 %, Use as directed twice daily for 2 weeks to the affected area, then, use 2-3 times weekly., Disp: 30 g, Rfl: 3   dicyclomine (BENTYL) 10 MG capsule, Take 1 capsule (10 mg total) by mouth 2 (two) times daily  before a meal. (Patient taking differently: Take 10 mg by mouth 2 (two) times daily as needed for spasms.), Disp: 60 capsule, Rfl: 5   levothyroxine (SYNTHROID) 150 MCG tablet, Take 1 tablet (150 mcg total) by mouth daily., Disp: 90 tablet, Rfl: 4   Obeticholic Acid (OCALIVA) 10 MG TABS, Take 10 mg by mouth daily., Disp: 90 tablet, Rfl: 3   ursodiol (ACTIGALL) 500 MG tablet, Take 1 tablet (500 mg total) by mouth 3 (three) times daily., Disp: 270 tablet, Rfl: 4  Current Facility-Administered Medications:    methylPREDNISolone acetate (DEPO-MEDROL) injection 40 mg, 40 mg, Intra-articular, Once, Vickki Hearing, MD   Allergies: Allergies  Allergen Reactions   Codeine Nausea And Vomiting    REVIEW OF SYSTEMS:   Review of Systems  Constitutional:  Negative for chills, fatigue and fever.  HENT:   Negative for lump/mass, mouth sores, nosebleeds, sore throat and trouble swallowing.   Eyes:  Negative for eye problems.  Respiratory:  Positive for cough. Negative for shortness of breath.   Cardiovascular:  Negative for chest pain, leg swelling and palpitations.  Gastrointestinal:  Negative for abdominal pain, constipation, diarrhea, nausea and vomiting.  Genitourinary:  Negative for bladder incontinence, difficulty urinating, dysuria, frequency, hematuria and nocturia.   Musculoskeletal:  Negative for arthralgias, back pain, flank pain, myalgias and neck pain.  Skin:  Positive for itching. Negative for rash.  Neurological:  Negative for dizziness, headaches and numbness.  Hematological:  Does not bruise/bleed easily.  Psychiatric/Behavioral:  Negative for depression, sleep disturbance and suicidal ideas. The patient is not nervous/anxious.   All other systems reviewed and are negative.    VITALS:   Blood pressure 136/83, pulse 75, temperature 98.3 F (36.8 C), temperature source Oral, resp. rate 18, weight 241 lb (109.3 kg), SpO2 96%.  Wt Readings from Last 3 Encounters:  05/18/23 241  lb (109.3 kg)  04/09/23 240 lb (108.9 kg)  04/08/23 240 lb 3.2 oz (109 kg)    Body mass index is 35.59 kg/m.  Performance status (ECOG): 1 - Symptomatic but completely ambulatory  PHYSICAL EXAM:   Physical Exam Vitals and nursing note reviewed. Exam conducted with a chaperone present.  Constitutional:      Appearance:  Normal appearance.  Cardiovascular:     Rate and Rhythm: Normal rate and regular rhythm.     Pulses: Normal pulses.     Heart sounds: Normal heart sounds.  Pulmonary:     Effort: Pulmonary effort is normal.     Breath sounds: Normal breath sounds.  Abdominal:     Palpations: Abdomen is soft. There is no hepatomegaly, splenomegaly or mass.     Tenderness: There is no abdominal tenderness.  Musculoskeletal:     Right lower leg: No edema.     Left lower leg: No edema.  Lymphadenopathy:     Cervical: No cervical adenopathy.     Right cervical: No superficial, deep or posterior cervical adenopathy.    Left cervical: No superficial, deep or posterior cervical adenopathy.     Upper Body:     Right upper body: No supraclavicular or axillary adenopathy.     Left upper body: No supraclavicular or axillary adenopathy.  Neurological:     General: No focal deficit present.     Mental Status: She is alert and oriented to person, place, and time.  Psychiatric:        Mood and Affect: Mood normal.        Behavior: Behavior normal.     LABS:      Latest Ref Rng & Units 05/18/2023    7:59 AM 04/29/2023   10:05 AM 04/22/2023    8:31 AM  CBC  WBC 4.0 - 10.5 K/uL 6.3  6.7  5.0   Hemoglobin 12.0 - 15.0 g/dL 32.4  40.1  02.7   Hematocrit 36.0 - 46.0 % 42.5  43.0  40.2   Platelets 150 - 400 K/uL 198  158  164       Latest Ref Rng & Units 03/13/2023   11:40 AM 03/07/2022    1:55 PM 12/29/2021    1:14 AM  CMP  Glucose 70 - 99 mg/dL 91  93  97   BUN 8 - 27 mg/dL 16  13  12    Creatinine 0.57 - 1.00 mg/dL 2.53  6.64  4.03   Sodium 134 - 144 mmol/L 144  141  137    Potassium 3.5 - 5.2 mmol/L 3.9  4.4  3.3   Chloride 96 - 106 mmol/L 108  105  108   CO2 20 - 29 mmol/L 20  22  23    Calcium 8.7 - 10.3 mg/dL 9.1  9.5  8.5   Total Protein 6.0 - 8.5 g/dL 7.1  6.9    Total Bilirubin 0.0 - 1.2 mg/dL 0.6  0.5    Alkaline Phos 44 - 121 IU/L 156  142    AST 0 - 40 IU/L 30  41    ALT 0 - 32 IU/L 43  53       No results found for: "CEA1", "CEA" / No results found for: "CEA1", "CEA" No results found for: "PSA1" No results found for: "CAN199" No results found for: "CAN125"  Lab Results  Component Value Date   TOTALPROTELP 6.6 03/19/2023   ALBUMINELP 3.7 03/19/2023   A1GS 0.3 03/19/2023   A2GS 0.6 03/19/2023   BETS 1.3 03/19/2023   GAMS 0.7 03/19/2023   MSPIKE Not Observed 03/19/2023   SPEI Comment 03/19/2023   Lab Results  Component Value Date   TIBC 374 12/26/2021   FERRITIN 362 (H) 12/26/2021   IRONPCTSAT 35 (H) 12/26/2021   Lab Results  Component Value Date   LDH 177 03/19/2023  STUDIES:   No results found.

## 2023-05-18 NOTE — Patient Instructions (Addendum)
[facility] at [facility] Discharge Instructions   You were seen and examined today by Dr. Ellin Saba.  He reviewed the results of your lab work. Your platelets are 198 today. The IVIG continues to be effective at this time.   Your platelets may drop again in the future. Dr. Kirtland Bouchard discussed with treatment in the form of pills if this should happen again in the future. He also discussed with you a weekly infusion that you would receive a total of 4 times. This medication is called Rituxan. Dr. Kirtland Bouchard does not recommend removing your spleen to treat this as it puts you at a higher risk for infections.   We will check your CBC monthly.   We will see you back in 4 months.   Return as scheduled.      Thank you for choosing [facility] at [facility] to provide your oncology and hematology care.  To afford each patient quality time with our provider, please arrive at least 15 minutes before your scheduled appointment time.   If you have a lab appointment with the Cancer Center please come in thru the Main Entrance and check in at the main information desk.  You need to re-schedule your appointment should you arrive 10 or more minutes late.  We strive to give you quality time with our providers, and arriving late affects you and other patients whose appointments are after yours.  Also, if you no show three or more times for appointments you may be dismissed from the clinic at the providers discretion.     Again, thank you for choosing [facility].  Our hope is that these requests will decrease the amount of time that you wait before being seen by our physicians.       _____________________________________________________________  Should you have questions after your visit to [facility], please contact our office at [phone] and follow the prompts.  Our office hours are 8:00 a.m. and 4:30 p.m. Monday - Friday.  Please note that  voicemails left after 4:00 p.m. may not be returned until the following business day.  We are closed weekends and major holidays.  You do have access to a nurse 24-7, just call the main number to the clinic [phone] and do not press any options, hold on the line and a nurse will answer the phone.    For prescription refill requests, have your pharmacy contact our office and allow 72 hours.    Due to Covid, you will need to wear a mask upon entering the hospital. If you do not have a mask, a mask will be given to you at the Main Entrance upon arrival. For doctor visits, patients may have 1 support person age 21 or older with them. For treatment visits, patients can not have anyone with them due to social distancing guidelines and our immunocompromised population.

## 2023-06-10 ENCOUNTER — Telehealth (INDEPENDENT_AMBULATORY_CARE_PROVIDER_SITE_OTHER): Payer: Self-pay

## 2023-06-10 NOTE — Telephone Encounter (Signed)
 Per [employer] support Services they have confirmed that Ocaliva  is now covered through [employer], and the PA is approved for 2025.They will be reaching out to the patient to provide a status and screen for any financial assistance they are eligible for . Thank you, per Jani Meeker, Care Coordinator. [email or web address] or [phone]. ( Will have Ann scan to patient chart under media).

## 2023-06-16 ENCOUNTER — Other Ambulatory Visit: Payer: Self-pay

## 2023-06-16 DIAGNOSIS — D693 Immune thrombocytopenic purpura: Secondary | ICD-10-CM

## 2023-06-17 ENCOUNTER — Inpatient Hospital Stay: Payer: Commercial Managed Care - PPO | Attending: Hematology

## 2023-06-17 DIAGNOSIS — D693 Immune thrombocytopenic purpura: Secondary | ICD-10-CM | POA: Insufficient documentation

## 2023-06-17 LAB — CBC
HCT: 46.3 % — ABNORMAL HIGH (ref 36.0–46.0)
Hemoglobin: 15.2 g/dL — ABNORMAL HIGH (ref 12.0–15.0)
MCH: 30.9 pg (ref 26.0–34.0)
MCHC: 32.8 g/dL (ref 30.0–36.0)
MCV: 94.1 fL (ref 80.0–100.0)
Platelets: 238 10*3/uL (ref 150–400)
RBC: 4.92 MIL/uL (ref 3.87–5.11)
RDW: 12.2 % (ref 11.5–15.5)
WBC: 7.4 10*3/uL (ref 4.0–10.5)
nRBC: 0 % (ref 0.0–0.2)

## 2023-06-18 ENCOUNTER — Inpatient Hospital Stay: Payer: Commercial Managed Care - PPO

## 2023-07-02 ENCOUNTER — Telehealth (INDEPENDENT_AMBULATORY_CARE_PROVIDER_SITE_OTHER): Payer: Self-pay | Admitting: *Deleted

## 2023-07-02 NOTE — Telephone Encounter (Signed)
 Patient called and states she got a call from pharmacy and needed to renew her copay assistance.   I called the co pay assistance phone number that pharmacy gave me [phone] and got the assistance card updated  ID- [id number] Group # [id number] BIN - [id number] PCN - [id number]  I then called [facility] and gave them the updated info and was told it would take 3-5 business days to update.

## 2023-07-02 NOTE — Telephone Encounter (Signed)
 Pt notified co pay assistance information was update at pharmacy. I will call pharmacy tomorrow to see if there are any issues with med going through with update information.

## 2023-07-03 NOTE — Telephone Encounter (Signed)
 Called [facility] pharmacy and was told the co pay assitance was update and showing co pay of $0 now and med should be delivered on 2/11. Patient was called and notified.

## 2023-07-07 ENCOUNTER — Other Ambulatory Visit: Payer: Self-pay

## 2023-07-07 ENCOUNTER — Other Ambulatory Visit (HOSPITAL_COMMUNITY): Payer: Self-pay

## 2023-07-07 MED ORDER — BUPROPION HCL ER (XL) 150 MG PO TB24
450.0000 mg | ORAL_TABLET | Freq: Every day | ORAL | 4 refills | Status: AC
  Filled 2023-07-07: qty 270, 90d supply, fill #0
  Filled 2023-11-02: qty 270, 90d supply, fill #1
  Filled 2024-03-08: qty 270, 90d supply, fill #2

## 2023-07-07 MED ORDER — LEVOTHYROXINE SODIUM 150 MCG PO TABS
150.0000 ug | ORAL_TABLET | Freq: Every day | ORAL | 4 refills | Status: AC
  Filled 2023-07-07: qty 90, 90d supply, fill #0
  Filled 2023-11-02: qty 90, 90d supply, fill #1
  Filled 2024-03-08: qty 90, 90d supply, fill #2
  Filled 2024-05-03 – 2024-05-23 (×5): qty 90, 90d supply, fill #3

## 2023-07-09 ENCOUNTER — Other Ambulatory Visit: Payer: Self-pay

## 2023-07-17 ENCOUNTER — Other Ambulatory Visit: Payer: Self-pay

## 2023-07-17 DIAGNOSIS — D693 Immune thrombocytopenic purpura: Secondary | ICD-10-CM

## 2023-07-17 DIAGNOSIS — D696 Thrombocytopenia, unspecified: Secondary | ICD-10-CM

## 2023-07-20 ENCOUNTER — Inpatient Hospital Stay: Payer: Commercial Managed Care - PPO | Attending: Hematology

## 2023-07-20 DIAGNOSIS — D693 Immune thrombocytopenic purpura: Secondary | ICD-10-CM | POA: Insufficient documentation

## 2023-07-20 DIAGNOSIS — K743 Primary biliary cirrhosis: Secondary | ICD-10-CM | POA: Diagnosis not present

## 2023-07-20 DIAGNOSIS — D696 Thrombocytopenia, unspecified: Secondary | ICD-10-CM

## 2023-07-20 LAB — CBC
HCT: 45.9 % (ref 36.0–46.0)
Hemoglobin: 15 g/dL (ref 12.0–15.0)
MCH: 30.4 pg (ref 26.0–34.0)
MCHC: 32.7 g/dL (ref 30.0–36.0)
MCV: 93.1 fL (ref 80.0–100.0)
Platelets: 241 10*3/uL (ref 150–400)
RBC: 4.93 MIL/uL (ref 3.87–5.11)
RDW: 12.3 % (ref 11.5–15.5)
WBC: 7.1 10*3/uL (ref 4.0–10.5)
nRBC: 0 % (ref 0.0–0.2)

## 2023-07-23 ENCOUNTER — Ambulatory Visit
Admission: RE | Admit: 2023-07-23 | Discharge: 2023-07-23 | Disposition: A | Discharge status: Home / Self Care | Payer: Commercial Managed Care - PPO | Source: Ambulatory Visit | Attending: Internal Medicine | Admitting: Internal Medicine

## 2023-07-23 VITALS — BP 121/79 | HR 83 | Temp 98.7°F | Resp 20

## 2023-07-23 DIAGNOSIS — H5789 Other specified disorders of eye and adnexa: Secondary | ICD-10-CM

## 2023-07-23 DIAGNOSIS — G4733 Obstructive sleep apnea (adult) (pediatric): Secondary | ICD-10-CM | POA: Diagnosis not present

## 2023-07-23 DIAGNOSIS — K8301 Primary sclerosing cholangitis: Secondary | ICD-10-CM | POA: Diagnosis not present

## 2023-07-23 NOTE — ED Provider Notes (Signed)
 [facility]    CSN: [id number] Arrival date & time: 07/23/23  1453      History   Chief Complaint Chief Complaint  Patient presents with   Eye Problem    Eye feels like something is in it for 3 days - Entered by patient    HPI Cynthia Donaldson is a 68 y.o. female.   Patient presents today with 3-day history of right eye irritation.  Reports symptoms began while she was driving in the car.  Denies known object in the right eye, but feels like there is a piece of fuzz or hair in her eye.  Denies contact lens use, blurred vision, headache, or floaters in the vision.  No vision changes.  No eye redness, drainage, or itching.  She wears reading glasses occasionally.    Past Medical History:  Diagnosis Date   Bile duct stenosis    Hyperlipidemia    Hypothyroidism    PONV (postoperative nausea and vomiting)    Sleep apnea     Patient Active Problem List   Diagnosis Date Noted   Chronic ITP (idiopathic thrombocytopenic purpura) (HCC) 05/18/2023   Thrombocytopenia (HCC) 03/18/2023   Lichen sclerosus et atrophicus 04/14/2022   Vulvar itching 04/14/2022   Constipation 01/22/2022   Choledocholithiasis 12/25/2021   Class 1 obesity 12/25/2021   Hypothyroidism 12/25/2021   Depression 12/25/2021   Encounter for gynecological examination with Papanicolaou smear of cervix 07/04/2021   Pelvic cramping 07/04/2021   Postmenopause 07/04/2021   Diarrhea 06/07/2021   Abdominal pain, epigastric 01/31/2020   Nonalcoholic steatohepatitis (NASH) 01/25/2019   RUQ abdominal pain 01/25/2019   Special screening for malignant neoplasms, colon 09/28/2017   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   Fracture of distal phalanx of right thumb 11/02/2012   Thumb pain 11/02/2012   Pain in joint, shoulder region 04/08/2011   Muscle weakness (generalized) 04/08/2011   Rotator cuff tear, right 04/03/2011   Rotator cuff syndrome of right shoulder 01/28/2011   TRIGGER FINGER 06/17/2007    COLLES' FRACTURE, RIGHT WRIST 05/18/2007    Past Surgical History:  Procedure Laterality Date   anal fissure repair     BILIARY DILATION  12/26/2021   Procedure: BILIARY DILATION;  Surgeon: Lemar Lofty., MD;  Location: [facility] ENDOSCOPY;  Service: Gastroenterology;;   BIOPSY  12/26/2021   Procedure: BIOPSY;  Surgeon: Lemar Lofty., MD;  Location: [facility] ENDOSCOPY;  Service: Gastroenterology;;   CHOLECYSTECTOMY     COLONOSCOPY N/A 12/02/2017   Procedure: COLONOSCOPY;  Surgeon: Malissa Hippo, MD;  Location: [facility] ENDO SUITE;  Service: Endoscopy;  Laterality: N/A;  1030   ERCP      X3   ERCP N/A 12/26/2021   Procedure: ENDOSCOPIC RETROGRADE CHOLANGIOPANCREATOGRAPHY (ERCP);  Surgeon: Lemar Lofty., MD;  Location: [facility] ENDOSCOPY;  Service: Gastroenterology;  Laterality: N/A;   ESOPHAGOGASTRODUODENOSCOPY N/A 12/26/2021   Procedure: ESOPHAGOGASTRODUODENOSCOPY (EGD);  Surgeon: Lemar Lofty., MD;  Location: [facility] ENDOSCOPY;  Service: Gastroenterology;  Laterality: N/A;   MYOMECTOMY     REMOVAL OF STONES  12/26/2021   Procedure: REMOVAL OF STONES;  Surgeon: Meridee Score Netty Starring., MD;  Location: [facility] ENDOSCOPY;  Service: Gastroenterology;;   Dennison Mascot  12/26/2021   Procedure: Dennison Mascot;  Surgeon: Lemar Lofty., MD;  Location: [facility] ENDOSCOPY;  Service: Gastroenterology;;   TONSILLECTOMY      OB History     Gravida  1   Para      Term      Preterm  AB  1   Living  0      SAB  1   IAB      Ectopic      Multiple      Live Births               Home Medications    Prior to Admission medications   Medication Sig Start Date End Date Taking? Authorizing Provider  buPROPion (WELLBUTRIN XL) 150 MG 24 hr tablet Take 3 tablets (450 mg total) by mouth daily. 07/07/23     clobetasol ointment (TEMOVATE) 0.05 % Use as directed twice daily for 2 weeks to the affected area, then, use 2-3 times weekly. 11/03/22   Adline Potter, NP   dicyclomine (BENTYL) 10 MG capsule Take 1 capsule (10 mg total) by mouth 2 (two) times daily before a meal. Patient taking differently: Take 10 mg by mouth 2 (two) times daily as needed for spasms. 06/07/21   Malissa Hippo, MD  levothyroxine (SYNTHROID) 150 MCG tablet Take 1 tablet (150 mcg total) by mouth daily. 07/07/23     Obeticholic Acid (OCALIVA) 10 MG TABS Take 10 mg by mouth daily. 02/09/23   Dolores Frame, MD  ursodiol (ACTIGALL) 500 MG tablet Take 1 tablet (500 mg total) by mouth 3 (three) times daily. 07/07/22       Family History Family History  Problem Relation Age of Onset   Hypertension Father    Cancer Father        stomach   Heart disease Mother    Colon cancer Brother    Suicidality Brother    Hypertension Brother    Arthritis Brother    Hypertension Brother    Arthritis Brother    Hypertension Brother    Arthritis Brother    Heart attack Brother    Suicidality Sister    Heart disease Other    Arthritis Other    Cancer Other     Social History Social History   Tobacco Use   Smoking status: Former    Current packs/day: 0.00    Types: Cigarettes    Quit date: 2022    Years since quitting: 3.1   Smokeless tobacco: Never   Tobacco comments:    07/2019  Vaping Use   Vaping status: Never Used  Substance Use Topics   Alcohol use: Yes    Comment: monthly   Drug use: No     Allergies   Codeine   Review of Systems Review of Systems Per HPI  Physical Exam Triage Vital Signs ED Triage Vitals  Encounter Vitals Group     BP 07/23/23 1520 121/79     Systolic BP Percentile --      Diastolic BP Percentile --      Pulse Rate 07/23/23 1520 83     Resp 07/23/23 1520 20     Temp 07/23/23 1520 98.7 F (37.1 C)     Temp Source 07/23/23 1520 Oral     SpO2 07/23/23 1520 94 %     Weight --      Height --      Head Circumference --      Peak Flow --      Pain Score 07/23/23 1521 4     Pain Loc --      Pain Education --      Exclude  from Growth Chart --    No data found.  Updated Vital Signs BP 121/79 (BP Location: Right Arm)  Pulse 83   Temp 98.7 F (37.1 C) (Oral)   Resp 20   SpO2 94%   Visual Acuity Right Eye Distance: 20/50 Left Eye Distance: 20/50 Bilateral Distance: 20/40  Right Eye Near:   Left Eye Near:    Bilateral Near:     Physical Exam Vitals and nursing note reviewed.  Constitutional:      General: She is not in acute distress.    Appearance: Normal appearance. She is not toxic-appearing.  HENT:     Head: Normocephalic and atraumatic.     Nose: Nose normal. No congestion or rhinorrhea.     Mouth/Throat:     Mouth: Mucous membranes are moist.     Pharynx: Oropharynx is clear.  Eyes:     General: No scleral icterus.       Right eye: No discharge.        Left eye: No discharge.     Extraocular Movements: Extraocular movements intact.     Pupils: Pupils are equal, round, and reactive to light.     Right eye: No fluorescein uptake.  Cardiovascular:     Rate and Rhythm: Normal rate.  Pulmonary:     Effort: Pulmonary effort is normal. No respiratory distress.  Musculoskeletal:     Cervical back: Normal range of motion.  Lymphadenopathy:     Cervical: No cervical adenopathy.  Skin:    General: Skin is warm and dry.     Coloration: Skin is not jaundiced or pale.     Findings: No erythema.  Neurological:     Mental Status: She is alert and oriented to person, place, and time.  Psychiatric:        Behavior: Behavior is cooperative.      UC Treatments / Results  Labs (all labs ordered are listed, but only abnormal results are displayed) Labs Reviewed - No data to display  EKG   Radiology No results found.  Procedures Procedures (including critical care time)  Medications Ordered in UC Medications - No data to display  Initial Impression / Assessment and Plan / UC Course  I have reviewed the triage vital signs and the nursing notes.  Pertinent labs & imaging results  that were available during my care of the patient were reviewed by me and considered in my medical decision making (see chart for details).   Patient is well-appearing, normotensive, afebrile, not tachycardic, not tachypneic, oxygenating well on room air.    1. Irritation of right eye No fluorescein uptake with fluorescein stain today Treat with lubricating eyedrops Recommended follow-up with optometrist if symptoms do not improve with treatment  The patient was given the opportunity to ask questions.  All questions answered to their satisfaction.  The patient is in agreement to this plan.    Final Clinical Impressions(s) / UC Diagnoses   Final diagnoses:  Irritation of right eye     Discharge Instructions      There are no corneal abrasions or foreign body in your right eye today.  Start using OTC blink eye drops (moderate to severe) to help with eye lubrication.  Seek care if symptoms do not improve with treatment.    ED Prescriptions   None    PDMP not reviewed this encounter.   Valentino Nose, NP 07/23/23 [phone]

## 2023-07-23 NOTE — ED Triage Notes (Signed)
 Pt reports she feels like something is in her right eye x 2 days

## 2023-07-23 NOTE — Discharge Instructions (Signed)
 There are no corneal abrasions or foreign body in your right eye today.  Start using OTC blink eye drops (moderate to severe) to help with eye lubrication.  Seek care if symptoms do not improve with treatment.

## 2023-08-03 ENCOUNTER — Ambulatory Visit (INDEPENDENT_AMBULATORY_CARE_PROVIDER_SITE_OTHER): Payer: Commercial Managed Care - PPO | Admitting: Gastroenterology

## 2023-08-06 ENCOUNTER — Telehealth: Payer: Self-pay | Admitting: Gastroenterology

## 2023-08-06 NOTE — Telephone Encounter (Signed)
 I received the results of the most recent blood workup performed on 07/21/2023 which showed albumin of 4.1, total bilirubin 0.3, direct bilirubin 0.09, alkaline phosphatase 149, AST 34, ALT 44.  Alkaline phosphatase values are similar to prior although they are less than 1.67 times upper limit of normal.  Reassuringly, total bilirubin remains less than 0.6.  Will continue with same dosage of Ocaliva.  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  Please let the patient know that we will continue the medication at same dosage.  She needs a follow-up appointment within the next 2 months.

## 2023-08-07 ENCOUNTER — Encounter (INDEPENDENT_AMBULATORY_CARE_PROVIDER_SITE_OTHER): Payer: Self-pay | Admitting: *Deleted

## 2023-08-07 NOTE — Telephone Encounter (Signed)
 Tried to call patient and no answer. Left on her voicemail to check her mychart for results or to call back if she wanted to discuss on phone.

## 2023-08-12 ENCOUNTER — Other Ambulatory Visit (HOSPITAL_COMMUNITY): Payer: Self-pay

## 2023-08-13 ENCOUNTER — Encounter (INDEPENDENT_AMBULATORY_CARE_PROVIDER_SITE_OTHER): Payer: Self-pay

## 2023-08-13 DIAGNOSIS — G4733 Obstructive sleep apnea (adult) (pediatric): Secondary | ICD-10-CM | POA: Diagnosis not present

## 2023-08-13 NOTE — Telephone Encounter (Signed)
 Darl Pikes, per Dr. Levon Hedger patient needs office visit in 2 months. Please schedule.

## 2023-08-13 NOTE — Telephone Encounter (Signed)
 Patient did read message about results.

## 2023-08-13 NOTE — Telephone Encounter (Signed)
 Thank you :)

## 2023-08-14 ENCOUNTER — Other Ambulatory Visit: Payer: Self-pay

## 2023-08-14 DIAGNOSIS — D693 Immune thrombocytopenic purpura: Secondary | ICD-10-CM

## 2023-08-17 ENCOUNTER — Inpatient Hospital Stay: Payer: Commercial Managed Care - PPO

## 2023-08-17 ENCOUNTER — Inpatient Hospital Stay: Attending: Hematology

## 2023-08-17 DIAGNOSIS — D693 Immune thrombocytopenic purpura: Secondary | ICD-10-CM | POA: Diagnosis not present

## 2023-08-17 LAB — CBC
HCT: 46.5 % — ABNORMAL HIGH (ref 36.0–46.0)
Hemoglobin: 15 g/dL (ref 12.0–15.0)
MCH: 29.8 pg (ref 26.0–34.0)
MCHC: 32.3 g/dL (ref 30.0–36.0)
MCV: 92.4 fL (ref 80.0–100.0)
Platelets: 220 10*3/uL (ref 150–400)
RBC: 5.03 MIL/uL (ref 3.87–5.11)
RDW: 12.5 % (ref 11.5–15.5)
WBC: 6.7 10*3/uL (ref 4.0–10.5)
nRBC: 0 % (ref 0.0–0.2)

## 2023-08-22 DIAGNOSIS — G4733 Obstructive sleep apnea (adult) (pediatric): Secondary | ICD-10-CM | POA: Diagnosis not present

## 2023-08-24 ENCOUNTER — Telehealth (INDEPENDENT_AMBULATORY_CARE_PROVIDER_SITE_OTHER): Payer: Self-pay | Admitting: Gastroenterology

## 2023-08-24 NOTE — Telephone Encounter (Signed)
 Patient left message asking for Toniann Fail to call her. [phone]

## 2023-08-25 ENCOUNTER — Telehealth (INDEPENDENT_AMBULATORY_CARE_PROVIDER_SITE_OTHER): Payer: Self-pay

## 2023-08-25 NOTE — Telephone Encounter (Signed)
 I spoke with the patient and she says she would like Toniann Fail to call her regarding patient assistance. I have forward a message to [place] regarding this. Mrs. Justen is aware Toniann Fail is out for today, and I would let Toniann Fail know to contact the patient upon her return.

## 2023-08-25 NOTE — Telephone Encounter (Signed)
 Patient called today wanting a call back regarding her Patient assistance, she says you have been helping her will this and she would appreciate a call back.

## 2023-08-26 NOTE — Telephone Encounter (Signed)
 Called patient she said she got a bill for $1000 for her ocaliva. I called [facility] and was told they have unpaid claim for $250 each for dates 02/13/23, 03/20/23, 04/15/23, 06/09/23 and 08/14/23 and I needed to call her co pay assistance. I called [employer] and was told that co pay card for 2025 still had 9,500 on it and claim for 3/21 was pain and 2/10 was paid. She did not see a claim for 10/25 or 04/08/24 and showing paid claim fro 1/30. I was told to call back to [facility] and have them call [employer]. I called back to [facility] and gave this information and was told I need to speak to billing and was transferred and held for 40 minutes and it was still staying estimated wait time over 34 minutes and had to hang up.

## 2023-08-26 NOTE — Telephone Encounter (Signed)
 Called [facility] back and asked for number for billing since unable to get through the last time I was transferred. Number [phone].

## 2023-08-27 NOTE — Telephone Encounter (Signed)
 Called [facility] and the rep called [employer] while I was on phone and they got the claim for October, November, and January paid and sept was still rejecting due to the date. They have it under a review to see if they can get it paid and told me to call back in a couple of days to get status. Patient was called and notified.

## 2023-09-04 DIAGNOSIS — H5789 Other specified disorders of eye and adnexa: Secondary | ICD-10-CM | POA: Diagnosis not present

## 2023-09-04 DIAGNOSIS — H2513 Age-related nuclear cataract, bilateral: Secondary | ICD-10-CM | POA: Diagnosis not present

## 2023-09-15 ENCOUNTER — Telehealth (INDEPENDENT_AMBULATORY_CARE_PROVIDER_SITE_OTHER): Payer: Self-pay | Admitting: *Deleted

## 2023-09-15 NOTE — Telephone Encounter (Signed)
 Thanks, please remind me of this Thursday as she has an appointment with me, we will review which labs to order at that time

## 2023-09-15 NOTE — Telephone Encounter (Signed)
 noted

## 2023-09-15 NOTE — Telephone Encounter (Signed)
 Cynthia Donaldson from [employer] called to let me PA for ocaliva  expires 10/01/23. She is due for refill next week and should be able to get that one. She said labs needed to be submitted to insurance with a tb test and recent alk phos. She had alk phos on 07/20/23 which was elevated at 149. Do you want to order any labs for the insurance?

## 2023-09-17 ENCOUNTER — Encounter (INDEPENDENT_AMBULATORY_CARE_PROVIDER_SITE_OTHER): Payer: Self-pay | Admitting: Gastroenterology

## 2023-09-17 ENCOUNTER — Ambulatory Visit (INDEPENDENT_AMBULATORY_CARE_PROVIDER_SITE_OTHER): Admitting: Gastroenterology

## 2023-09-17 ENCOUNTER — Encounter: Payer: Self-pay | Admitting: Hematology

## 2023-09-17 VITALS — BP 122/81 | HR 87 | Temp 97.5°F | Ht 69.0 in | Wt 242.9 lb

## 2023-09-17 DIAGNOSIS — R1013 Epigastric pain: Secondary | ICD-10-CM

## 2023-09-17 DIAGNOSIS — K8050 Calculus of bile duct without cholangitis or cholecystitis without obstruction: Secondary | ICD-10-CM

## 2023-09-17 DIAGNOSIS — K743 Primary biliary cirrhosis: Secondary | ICD-10-CM

## 2023-09-17 DIAGNOSIS — Z111 Encounter for screening for respiratory tuberculosis: Secondary | ICD-10-CM

## 2023-09-17 DIAGNOSIS — E669 Obesity, unspecified: Secondary | ICD-10-CM | POA: Insufficient documentation

## 2023-09-17 DIAGNOSIS — K7581 Nonalcoholic steatohepatitis (NASH): Secondary | ICD-10-CM

## 2023-09-17 DIAGNOSIS — Z800 Family history of malignant neoplasm of digestive organs: Secondary | ICD-10-CM

## 2023-09-17 MED ORDER — SEMAGLUTIDE-WEIGHT MANAGEMENT 0.25 MG/0.5ML ~~LOC~~ SOAJ
SUBCUTANEOUS | 0 refills | Status: AC
  Filled 2023-09-17: qty 22, 112d supply, fill #0

## 2023-09-17 MED ORDER — SEMAGLUTIDE-WEIGHT MANAGEMENT 1.7 MG/0.75ML ~~LOC~~ SOAJ
1.7000 mg | SUBCUTANEOUS | 0 refills | Status: AC
  Filled 2023-09-17: qty 3, 28d supply, fill #0

## 2023-09-17 MED ORDER — SEMAGLUTIDE-WEIGHT MANAGEMENT 2.4 MG/0.75ML ~~LOC~~ SOAJ
2.4000 mg | SUBCUTANEOUS | 2 refills | Status: AC
  Filled 2023-09-17: qty 3, fill #0

## 2023-09-17 NOTE — Patient Instructions (Addendum)
 Perform blood workup If LFTs are elevated, will proceed withj MRCP with and without contrast Start semaglutide  for weight loss as follows: Week 1 through week 4: 0.25 mg once weekly. Week 5 through week 8: 0.5 mg once weekly. Week 9 through week 12: 1 mg once weekly. Week 13 through week 16: 1.7 mg once weekly. Week 17 and thereafter (target dosage): 2.4 mg once weekly.  Focus on intake of protein rich meals instead of carbohydrates to achieve further weight loss without significant protein loss Please reach us  back regarding scheduling colonoscopy when interested in proceeding with this Continue Ocaliva  10 mg qday

## 2023-09-17 NOTE — Progress Notes (Unsigned)
 Cynthia Donaldson, M.D. Gastroenterology & Hepatology [facility] [address] [place], [place] [address]  Primary Care Physician: Cynthia Bile, MD [address] [place] [address]  I will communicate my assessment and recommendations to the referring MD via EMR.  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 female with history of Cynthia Donaldson, obesity, depression, hypothyroidism, ITP, primary biliary cholangitis and recurrent choledocholithiasis, who presents for follow up of PBC and NASH.  The patient was last seen on 08/04/2022. At that time, the patient was continued on ursoldiol 500 mg every 8 hours.  Due to increase in alkaline phosphatase, patient was eventually increased to Ocaliva  10 mg daily in October 2024.  Most recent blood workup performed on 07/21/2023 which showed albumin of 4.1, total bilirubin 0.3, direct bilirubin 0.09, alkaline phosphatase 149, AST 34, ALT 44.   Patient reports in the last 3-4 months, she has presented a gnagging pain in her epigastric area. This is similar to the last episodes of choledocholitiasis. She feels nauseated when this happens but she does not vomit. It eventually resolves after an hour. No heartburn. The patient denies having any nausea, vomiting, fever, chills, hematochezia, melena, hematemesis, abdominal distention, abdominal pain, diarrhea, jaundice, pruritus.  She is interested in losing weight as a strategy to manage her fatty liver.  Patient is currently on Ocaliva  10 mg daily.  Patient performs blood workup at PCPs office, is scheduled to see him in the next few weeks.  Last EGD: 2019 - Normal esophagus. - Z-line irregular, 39 cm from the incisors. Biopsied. - A single gastric polyp. Biopsied. - Erosive gastropathy. - Normal cardia, gastric fundus, gastric body and pylorus. - Normal  duodenal bulb and second portion of the duodenum.   Last Colonoscopy: 11/2017 - Two small polyps at the splenic flexure and in the transverse colon. Biopsied. - Diverticulosis in the sigmoid colon. - External hemorrhoids.   Path 1. Stomach, polyp(s) - HYPERPLASTIC GASTRIC POLYP - NO HIGH GRADE DYSPLASIA OR MALIGNANCY IDENTIFIED 2. Esophagogastric junction, biopsy - SQUAMOCOLUMNAR JUNCTION WITH CHRONIC INFLAMMATION - NO INTESTINAL METAPLASIA, DYSPLASIA OR MALIGNANCY IDENTIFIED 3. Colon, polyp(s), transverse and splenic flexure - POLYPOID FRAGMENTS OF COLONIC MUCOSA WITH LYMPHOID AGGREGATE(S) - NO HIGH GRADE DYSPLASIA OR MALIGNANCY IDENTIFIED - SEE COMMEN   Recommended repeat colonoscopy in 5 years  Past Medical History: Past Medical History:  Diagnosis Date   Donaldson duct stenosis    Hyperlipidemia    Hypothyroidism    PONV (postoperative nausea and vomiting)    Sleep apnea     Past Surgical History: Past Surgical History:  Procedure Laterality Date   anal fissure repair     BILIARY DILATION  12/26/2021   Procedure: BILIARY DILATION;  Surgeon: Cynthia Donaldson., MD;  Location[facility] ENDOSCOPY;  Service: Gastroenterology;;   BIOPSY  12/26/2021   Procedure: BIOPSY;  Surgeon: Cynthia Donaldson., MD;  Location: [facility] ENDOSCOPY;  Service: Gastroenterology;;   CHOLECYSTECTOMY     COLONOSCOPY N/A 12/02/2017   Procedure: COLONOSCOPY;  Surgeon: Cynthia Corporal, MD;  Location: [facility] ENDO SUITE;  Service: Endoscopy;  Laterality: N/A;  1030   ERCP      X3   ERCP N/A 12/26/2021   Procedure: ENDOSCOPIC RETROGRADE CHOLANGIOPANCREATOGRAPHY (ERCP);  Surgeon: Cynthia Donaldson., MD;  Location: [facility] ENDOSCOPY;  Service: Gastroenterology;  Laterality: N/A;   ESOPHAGOGASTRODUODENOSCOPY N/A 12/26/2021   Procedure: ESOPHAGOGASTRODUODENOSCOPY (EGD);  Surgeon: Cynthia Donaldson., MD;  Location: [facility] ENDOSCOPY;  Service: Gastroenterology;  Laterality: N/A;   MYOMECTOMY     REMOVAL OF STONES   12/26/2021   Procedure: REMOVAL OF STONES;  Surgeon: Donaldson, Cynthia Alu., MD;  Location: [facility] ENDOSCOPY;  Service: Gastroenterology;;   [address]  12/26/2021   Procedure: [address];  Surgeon: Donaldson, Cynthia Alu., MD;  Location: [facility] ENDOSCOPY;  Service: Gastroenterology;;   TONSILLECTOMY      Family History: Family History  Problem Relation Age of Onset   Hypertension Father    Cancer Father        stomach   Heart disease Mother    Colon cancer Brother    Suicidality Brother    Hypertension Brother    Arthritis Brother    Hypertension Brother    Arthritis Brother    Hypertension Brother    Arthritis Brother    Heart attack Brother    Suicidality Sister    Heart disease Other    Arthritis Other    Cancer Other     Social History: Social History   Tobacco Use  Smoking Status Former   Current packs/day: 0.00   Types: Cigarettes   Quit date: 2022   Years since quitting: 3.3  Smokeless Tobacco Never  Tobacco Comments   07/2019   Social History   Substance and Sexual Activity  Alcohol Use Yes   Comment: monthly   Social History   Substance and Sexual Activity  Drug Use No    Allergies: Allergies  Allergen Reactions   Codeine Nausea And Vomiting    Medications: Current Outpatient Medications  Medication Sig Dispense Refill   buPROPion  (WELLBUTRIN  XL) 150 MG 24 hr tablet Take 3 tablets (450 mg total) by mouth daily. 270 tablet 4   clobetasol  ointment (TEMOVATE ) 0.05 % Use as directed twice daily for 2 weeks to the affected area, then, use 2-3 times weekly. 30 g 3   levothyroxine  (SYNTHROID ) 150 MCG tablet Take 1 tablet (150 mcg total) by mouth daily. 90 tablet 4   Obeticholic Acid  (OCALIVA ) 10 MG TABS Take 10 mg by mouth daily. 90 tablet 3   ursodiol  (ACTIGALL ) 500 MG tablet Take 1 tablet (500 mg total) by mouth 3 (three) times daily. 270 tablet 4   Current Facility-Administered Medications  Medication Dose Route Frequency Provider Last Rate  Last Admin   methylPREDNISolone  acetate (DEPO-MEDROL ) injection 40 mg  40 mg Intra-articular Once Cynthia Emerald, MD        Review of Systems: GENERAL: negative for malaise, night sweats HEENT: No changes in hearing or vision, no nose bleeds or other nasal problems. NECK: Negative for lumps, goiter, pain and significant neck swelling RESPIRATORY: Negative for cough, wheezing CARDIOVASCULAR: Negative for chest pain, leg swelling, palpitations, orthopnea GI: SEE HPI MUSCULOSKELETAL: Negative for joint pain or swelling, back pain, and muscle pain. SKIN: Negative for lesions, rash PSYCH: Negative for sleep disturbance, mood disorder and recent psychosocial stressors. HEMATOLOGY Negative for prolonged bleeding, bruising easily, and swollen nodes. ENDOCRINE: Negative for cold or heat intolerance, polyuria, polydipsia and goiter. NEURO: negative for tremor, gait imbalance, syncope and seizures. The remainder of the review of systems is noncontributory.   Physical Exam: BP 122/81 (BP Location: Left Arm, Patient Position: Sitting, Cuff Size: Large)   Pulse 87   Temp (!) 97.5 F (36.4 C) (Temporal)   Ht 5\' 9"  (1.753 m)   Wt 242 lb 14.4 oz (110.2 kg)   BMI 35.87 kg/m  GENERAL: The patient is  AO x3, in no acute distress.  Obese. HEENT: Head is normocephalic and atraumatic. EOMI are intact. Mouth is well hydrated and without lesions. NECK: Supple. No masses LUNGS: Clear to auscultation. No presence of rhonchi/wheezing/rales. Adequate chest expansion HEART: RRR, normal s1 and s2. ABDOMEN: Soft, nontender, no guarding, no peritoneal signs, and nondistended. BS +. No masses. EXTREMITIES: Without any cyanosis, clubbing, rash, lesions or edema. NEUROLOGIC: AOx3, no focal motor deficit. SKIN: no jaundice, no rashes  Imaging/Labs: as above  I personally reviewed and interpreted the available labs, imaging and endoscopic files.  Impression and Plan: Cynthia Donaldson is a 68 y.o. female  with history of Cynthia Donaldson, obesity, depression, hypothyroidism, ITP, primary biliary cholangitis and recurrent choledocholithiasis, who presents for follow up of PBC and NASH.  Regarding the patient's diagnosis of PBC, this has been managed with obeticholic acid , currently at 10 mg daily dosing given uprising alkaline phosphatase in the past.  He is technically at goal as her alkaline phosphatase is less than 1.67 times the upper limit and has  normal total bilirubin.  Will continue with this medication for now.  Will check repeat CBC CMP and QuantiFERON (QuantiFERON requested by insurance for medication approval).  Notably, he has presented recurrent abdominal pain episodes which have been concerning for her as she had similar symptoms during her previous episodes of choledocholithiasis.  These episodes have been self-limited.  We discussed that if these episodes of pain were caused by stones, it is possible she has passed these stones as the pain has subsided in between acute attacks, so the likelihood of finding an abnormality is low if asymptomatic and if biochemistries are normal.  Due to this, we may consider proceeding with an MRCP if her CMP shows LFT abnormalities or if she presents recurrent abdominal pain.  The patient also would like to discuss the possibility of trying weight loss medications given her history of NASH and fatty liver.  We will send a prescription for semaglutide  subcutaneous injections.  There are some phase 3 data studies showing promising improvements in liver fibrosis when taking GLP-1 agonist.  Dietary recommendations were also provided.  Finally, she was recommended to have a repeat colonoscopy 5 years from last procedure given family history of colon cancer (brother).  She would like to hold off on this for now and will let us  know if she decides to pursue this.  -Check CBC, CMP and QuantiFERON -If LFTs are elevated, will proceed withj MRCP with and without contrast -Start  semaglutide  for weight loss as follows: Week 1 through week 4: 0.25 mg once weekly. Week 5 through week 8: 0.5 mg once weekly. Week 9 through week 12: 1 mg once weekly. Week 13 through week 16: 1.7 mg once weekly. Week 17 and thereafter (target dosage): 2.4 mg once weekly. -Focus on intake of protein rich meals instead of carbohydrates to achieve further weight loss without significant protein loss -Patient should reach us  back regarding scheduling colonoscopy when interested in proceeding with this -Continue Ocaliva  10 mg qday  All questions were answered.      Cynthia Cress, MD Gastroenterology and Hepatology [facility]

## 2023-09-17 NOTE — Telephone Encounter (Signed)
 Called [facility] at [phone] and spoke to patient specialist Moira Andrews who told me the sept claim for 250 had also been paid and patient had a zero balance. Patient has appt today and I will let her know.

## 2023-09-17 NOTE — Telephone Encounter (Signed)
 Pt.notified

## 2023-09-18 ENCOUNTER — Other Ambulatory Visit (HOSPITAL_COMMUNITY): Payer: Self-pay

## 2023-09-18 ENCOUNTER — Other Ambulatory Visit (INDEPENDENT_AMBULATORY_CARE_PROVIDER_SITE_OTHER): Payer: Self-pay | Admitting: Gastroenterology

## 2023-09-18 DIAGNOSIS — E66812 Morbid (severe) obesity due to excess calories: Secondary | ICD-10-CM

## 2023-09-18 NOTE — Telephone Encounter (Signed)
 Please send it separated Thanks

## 2023-09-18 NOTE — Telephone Encounter (Signed)
 Pharmacy needs rx separated. Do you want me to send in each one separate or do you want to send it it?

## 2023-09-18 NOTE — Telephone Encounter (Signed)
 Pt given lab order yesterday at office visit and she was going to do at dr. Velna Ghee office.

## 2023-09-21 ENCOUNTER — Encounter: Payer: Self-pay | Admitting: Pharmacist

## 2023-09-21 ENCOUNTER — Inpatient Hospital Stay: Payer: Commercial Managed Care - PPO | Admitting: Hematology

## 2023-09-21 ENCOUNTER — Ambulatory Visit (INDEPENDENT_AMBULATORY_CARE_PROVIDER_SITE_OTHER): Admitting: Gastroenterology

## 2023-09-21 ENCOUNTER — Other Ambulatory Visit (INDEPENDENT_AMBULATORY_CARE_PROVIDER_SITE_OTHER): Payer: Self-pay | Admitting: *Deleted

## 2023-09-21 ENCOUNTER — Other Ambulatory Visit: Payer: Self-pay

## 2023-09-21 ENCOUNTER — Other Ambulatory Visit (HOSPITAL_COMMUNITY): Payer: Self-pay

## 2023-09-21 ENCOUNTER — Inpatient Hospital Stay: Payer: Commercial Managed Care - PPO

## 2023-09-21 DIAGNOSIS — D693 Immune thrombocytopenic purpura: Secondary | ICD-10-CM

## 2023-09-21 DIAGNOSIS — D696 Thrombocytopenia, unspecified: Secondary | ICD-10-CM

## 2023-09-21 MED ORDER — SEMAGLUTIDE-WEIGHT MANAGEMENT 0.25 MG/0.5ML ~~LOC~~ SOAJ
0.2500 mg | SUBCUTANEOUS | 0 refills | Status: DC
  Filled 2023-09-21: qty 2, 28d supply, fill #0

## 2023-09-21 MED ORDER — SEMAGLUTIDE-WEIGHT MANAGEMENT 1 MG/0.5ML ~~LOC~~ SOAJ
SUBCUTANEOUS | 0 refills | Status: AC
  Filled 2023-09-21: qty 2, fill #0

## 2023-09-21 MED ORDER — SEMAGLUTIDE-WEIGHT MANAGEMENT 0.5 MG/0.5ML ~~LOC~~ SOAJ
SUBCUTANEOUS | 0 refills | Status: AC
  Filled 2023-09-21: qty 2, fill #0

## 2023-09-21 MED ORDER — SEMAGLUTIDE-WEIGHT MANAGEMENT 0.25 MG/0.5ML ~~LOC~~ SOAJ
SUBCUTANEOUS | 0 refills | Status: DC
  Filled 2023-09-21: qty 1, 28d supply, fill #0

## 2023-09-21 NOTE — Progress Notes (Incomplete)
 [facility] [address], [place] [address]    Clinic Day:  09/21/2023  Referring physician: Artemisa Bile, MD  Patient Care Team: Artemisa Bile, MD as PCP - General (Internal Medicine)   ASSESSMENT & PLAN:   Assessment: 1.  Immune mediated thrombocytopenia: - Patient seen at the request of Dr. Sammi Crick - CBC on 03/13/2023: PLT-33, Hb-15.7, WBC-6.9. - She has primary biliary cirrhosis, on Ocaliva  for the last few years, dose increased to 10 mg on 02/09/2023. - She denies drinking tonic water /using quinine supplements. - Reports easy bruising on the extremities for the last 2 months.  No active bleeding.  No recent infections or antibiotic use. - Dexamethasone  trial (40 mg daily x 4 days) on 03/19/2023 with platelet improvement from 34 to 177, with drop to 5 on 04/07/2023, transient response. - IVIG 1 g/kg on 04/08/2023 on 04/09/2023   2. Social/Family History:  -Retired. No chemical exposure. Quit tobacco use 2 years ago with 2 cigarettes a day  and off since age 20. -No family history of decreased platelets. Brother died of colon cancer. Father had stomach cancer. Brother has discoid lupus.  No family history of DVT/PE.    Plan: 1.  Immune mediated thrombocytopenia: - She had transient response with relapse of ITP after dexamethasone . - She has tolerated IVIG very well. - Today platelet count is 198.  Rest of the CBC was normal. - We talked about second line therapy options including TPO mimetics (eltrombopag, avatrombopag) slightly increased to thrombosis risk, Syk inhibitor fostamatinib (diarrhea and hypertension), and rituximab (infusion reaction and infection risk). - We discussed merits and complications of the above treatment options. - We have also discussed newer trials showing TPO agonists and fostamatinib can be discontinued after very good response after 3 months. - She does not require any treatment at this time as her platelet count is normal today.   We will monitor her CBC once a month.  RTC 4 months for follow-up.    No orders of the defined types were placed in this encounter.     [name],acting as a Neurosurgeon for Paulett Boros, MD.,have documented all relevant documentation on the behalf of Paulett Boros, MD,as directed by  Paulett Boros, MD while in the presence of Paulett Boros, MD.  ***   [place]   4/28/20259:38 AM  CHIEF COMPLAINT:   Diagnosis: Severe thrombocytopenia    Cancer Staging  No matching staging information was found for the patient.    Prior Therapy: Trial of dexamethasone    Current Therapy:  IVIG   HISTORY OF PRESENT ILLNESS:   Oncology History   No history exists.     INTERVAL HISTORY:   Cynthia Donaldson is a 68 y.o. female presenting to clinic today for follow up of Severe thrombocytopenia. She was last seen by me on 05/18/23.  Today, she states that she is doing well overall. Her appetite level is at ***%. Her energy level is at ***%.  PAST MEDICAL HISTORY:   Past Medical History: Past Medical History:  Diagnosis Date   Bile duct stenosis    Hyperlipidemia    Hypothyroidism    PONV (postoperative nausea and vomiting)    Sleep apnea     Surgical History: Past Surgical History:  Procedure Laterality Date   anal fissure repair     BILIARY DILATION  12/26/2021   Procedure: BILIARY DILATION;  Surgeon: Normie Becton., MD;  Location: [facility] ENDOSCOPY;  Service: Gastroenterology;;   BIOPSY  12/26/2021   Procedure: BIOPSY;  Surgeon: Normie Becton., MD;  Location: [facility] ENDOSCOPY;  Service: Gastroenterology;;   CHOLECYSTECTOMY     COLONOSCOPY N/A 12/02/2017   Procedure: COLONOSCOPY;  Surgeon: Ruby Corporal, MD;  Location: [facility] ENDO SUITE;  Service: Endoscopy;  Laterality: N/A;  1030   ERCP      X3   ERCP N/A 12/26/2021   Procedure: ENDOSCOPIC RETROGRADE CHOLANGIOPANCREATOGRAPHY (ERCP);  Surgeon: Normie Becton., MD;  Location: [facility] ENDOSCOPY;   Service: Gastroenterology;  Laterality: N/A;   ESOPHAGOGASTRODUODENOSCOPY N/A 12/26/2021   Procedure: ESOPHAGOGASTRODUODENOSCOPY (EGD);  Surgeon: Normie Becton., MD;  Location: [facility] ENDOSCOPY;  Service: Gastroenterology;  Laterality: N/A;   MYOMECTOMY     REMOVAL OF STONES  12/26/2021   Procedure: REMOVAL OF STONES;  Surgeon: Mansouraty, Albino Alu., MD;  Location: [facility] ENDOSCOPY;  Service: Gastroenterology;;   [address]  12/26/2021   Procedure: [address];  Surgeon: Mansouraty, Albino Alu., MD;  Location: [facility] ENDOSCOPY;  Service: Gastroenterology;;   TONSILLECTOMY      Social History: Social History   Socioeconomic History   Marital status: Married    Spouse name: Not on file   Number of children: Not on file   Years of education: Not on file   Highest education level: Not on file  Occupational History   Occupation: Homemaker  Tobacco Use   Smoking status: Former    Current packs/day: 0.00    Types: Cigarettes    Quit date: 2022    Years since quitting: 3.3   Smokeless tobacco: Never   Tobacco comments:    07/2019  Vaping Use   Vaping status: Never Used  Substance and Sexual Activity   Alcohol use: Yes    Comment: monthly   Drug use: No   Sexual activity: Not Currently    Birth control/protection: Post-menopausal  Other Topics Concern   Not on file  Social History Narrative   Not on file   Social Drivers of Health   Financial Resource Strain: Low Risk  (07/04/2021)   Overall Financial Resource Strain (CARDIA)    Difficulty of Paying Living Expenses: Not hard at all  Food Insecurity: No Food Insecurity (07/04/2021)   Hunger Vital Sign    Worried About Running Out of Food in the Last Year: Never true    Ran Out of Food in the Last Year: Never true  Transportation Needs: No Transportation Needs (07/04/2021)   PRAPARE - Administrator, Civil Service (Medical): No    Lack of Transportation (Non-Medical): No  Physical Activity: Inactive (07/04/2021)    Exercise Vital Sign    Days of Exercise per Week: 0 days    Minutes of Exercise per Session: 0 min  Stress: No Stress Concern Present (07/04/2021)   [employer] of Occupational Health - Occupational Stress Questionnaire    Feeling of Stress : Not at all  Social Connections: Moderately Isolated (07/04/2021)   Social Connection and Isolation Panel [NHANES]    Frequency of Communication with Friends and Family: More than three times a week    Frequency of Social Gatherings with Friends and Family: Once a week    Attends Religious Services: Never    Database administrator or Organizations: No    Attends Banker Meetings: Never    Marital Status: Married  Catering manager Violence: Not At Risk (07/04/2021)   Humiliation, Afraid, Rape, and Kick questionnaire    Fear of Current or Ex-Partner: No    Emotionally Abused: No  Physically Abused: No    Sexually Abused: No    Family History: Family History  Problem Relation Age of Onset   Hypertension Father    Cancer Father        stomach   Heart disease Mother    Colon cancer Brother    Suicidality Brother    Hypertension Brother    Arthritis Brother    Hypertension Brother    Arthritis Brother    Hypertension Brother    Arthritis Brother    Heart attack Brother    Suicidality Sister    Heart disease Other    Arthritis Other    Cancer Other     Current Medications:  Current Outpatient Medications:    Semaglutide -Weight Management 0.25 MG/0.5ML SOAJ, Inject 0.25 mg once a week for 28 days, Disp: 1 mL, Rfl: 0   Semaglutide -Weight Management 0.5 MG/0.5ML SOAJ, Inject 0.5 mg once a week for 28 days., Disp: 2 mL, Rfl: 0   Semaglutide -Weight Management 1 MG/0.5ML SOAJ, Inject 1mg  once a week for 28 days, Disp: 2 mL, Rfl: 0   buPROPion  (WELLBUTRIN  XL) 150 MG 24 hr tablet, Take 3 tablets (450 mg total) by mouth daily., Disp: 270 tablet, Rfl: 4   clobetasol  ointment (TEMOVATE ) 0.05 %, Use as directed twice daily for 2  weeks to the affected area, then, use 2-3 times weekly., Disp: 30 g, Rfl: 3   levothyroxine  (SYNTHROID ) 150 MCG tablet, Take 1 tablet (150 mcg total) by mouth daily., Disp: 90 tablet, Rfl: 4   Obeticholic Acid  (OCALIVA ) 10 MG TABS, Take 10 mg by mouth daily., Disp: 90 tablet, Rfl: 3   Semaglutide -Weight Management 0.25 MG/0.5ML SOAJ, Inject 0.25 mg into the skin once a week for 28 days, THEN 0.5 mg once a week for 28 days, THEN 1 mg once a week for 28 days, THEN 1 mg once a week for 28 days., Disp: 22 mL, Rfl: 0   Semaglutide -Weight Management 1.7 MG/0.75ML SOAJ, Inject 1.7 mg into the skin once a week for 28 days., Disp: 3 mL, Rfl: 0   Semaglutide -Weight Management 2.4 MG/0.75ML SOAJ, Inject 2.4 mg into the skin once a week., Disp: 3 mL, Rfl: 2   ursodiol  (ACTIGALL ) 500 MG tablet, Take 1 tablet (500 mg total) by mouth 3 (three) times daily., Disp: 270 tablet, Rfl: 4  Current Facility-Administered Medications:    methylPREDNISolone  acetate (DEPO-MEDROL ) injection 40 mg, 40 mg, Intra-articular, Once, Darrin Emerald, MD   Allergies: Allergies  Allergen Reactions   Codeine Nausea And Vomiting    REVIEW OF SYSTEMS:   Review of Systems  Constitutional:  Negative for chills, fatigue and fever.  HENT:   Negative for lump/mass, mouth sores, nosebleeds, sore throat and trouble swallowing.   Eyes:  Negative for eye problems.  Respiratory:  Negative for cough and shortness of breath.   Cardiovascular:  Negative for chest pain, leg swelling and palpitations.  Gastrointestinal:  Negative for abdominal pain, constipation, diarrhea, nausea and vomiting.  Genitourinary:  Negative for bladder incontinence, difficulty urinating, dysuria, frequency, hematuria and nocturia.   Musculoskeletal:  Negative for arthralgias, back pain, flank pain, myalgias and neck pain.  Skin:  Negative for itching and rash.  Neurological:  Negative for dizziness, headaches and numbness.  Hematological:  Does not  bruise/bleed easily.  Psychiatric/Behavioral:  Negative for depression, sleep disturbance and suicidal ideas. The patient is not nervous/anxious.   All other systems reviewed and are negative.    VITALS:   There were no vitals taken  for this visit.  Wt Readings from Last 3 Encounters:  09/17/23 242 lb 14.4 oz (110.2 kg)  05/18/23 241 lb (109.3 kg)  04/09/23 240 lb (108.9 kg)    There is no height or weight on file to calculate BMI.  Performance status (ECOG): 1 - Symptomatic but completely ambulatory  PHYSICAL EXAM:   Physical Exam Vitals and nursing note reviewed. Exam conducted with a chaperone present.  Constitutional:      Appearance: Normal appearance.  Cardiovascular:     Rate and Rhythm: Normal rate and regular rhythm.     Pulses: Normal pulses.     Heart sounds: Normal heart sounds.  Pulmonary:     Effort: Pulmonary effort is normal.     Breath sounds: Normal breath sounds.  Abdominal:     Palpations: Abdomen is soft. There is no hepatomegaly, splenomegaly or mass.     Tenderness: There is no abdominal tenderness.  Musculoskeletal:     Right lower leg: No edema.     Left lower leg: No edema.  Lymphadenopathy:     Cervical: No cervical adenopathy.     Right cervical: No superficial, deep or posterior cervical adenopathy.    Left cervical: No superficial, deep or posterior cervical adenopathy.     Upper Body:     Right upper body: No supraclavicular or axillary adenopathy.     Left upper body: No supraclavicular or axillary adenopathy.  Neurological:     General: No focal deficit present.     Mental Status: She is alert and oriented to person, place, and time.  Psychiatric:        Mood and Affect: Mood normal.        Behavior: Behavior normal.     LABS:      Latest Ref Rng & Units 08/17/2023   10:26 AM 07/20/2023    3:12 PM 06/17/2023   12:29 PM  CBC  WBC 4.0 - 10.5 K/uL 6.7  7.1  7.4   Hemoglobin 12.0 - 15.0 g/dL 16.1  09.6  04.5   Hematocrit 36.0 -  46.0 % 46.5  45.9  46.3   Platelets 150 - 400 K/uL 220  241  238       Latest Ref Rng & Units 03/13/2023   11:40 AM 03/07/2022    1:55 PM 12/29/2021    1:14 AM  CMP  Glucose 70 - 99 mg/dL 91  93  97   BUN 8 - 27 mg/dL 16  13  12    Creatinine 0.57 - 1.00 mg/dL 4.09  8.11  9.14   Sodium 134 - 144 mmol/L 144  141  137   Potassium 3.5 - 5.2 mmol/L 3.9  4.4  3.3   Chloride 96 - 106 mmol/L 108  105  108   CO2 20 - 29 mmol/L 20  22  23    Calcium 8.7 - 10.3 mg/dL 9.1  9.5  8.5   Total Protein 6.0 - 8.5 g/dL 7.1  6.9    Total Bilirubin 0.0 - 1.2 mg/dL 0.6  0.5    Alkaline Phos 44 - 121 IU/L 156  142    AST 0 - 40 IU/L 30  41    ALT 0 - 32 IU/L 43  53       No results found for: "CEA1", "CEA" / No results found for: "CEA1", "CEA" No results found for: "PSA1" No results found for: "NWG956" No results found for: "OZH086"  Lab Results  Component Value Date  TOTALPROTELP 6.6 03/19/2023   ALBUMINELP 3.7 03/19/2023   A1GS 0.3 03/19/2023   A2GS 0.6 03/19/2023   BETS 1.3 03/19/2023   GAMS 0.7 03/19/2023   MSPIKE Not Observed 03/19/2023   SPEI Comment 03/19/2023   Lab Results  Component Value Date   TIBC 374 12/26/2021   FERRITIN 362 (H) 12/26/2021   IRONPCTSAT 35 (H) 12/26/2021   Lab Results  Component Value Date   LDH 177 03/19/2023     STUDIES:   No results found.

## 2023-09-22 DIAGNOSIS — G4733 Obstructive sleep apnea (adult) (pediatric): Secondary | ICD-10-CM | POA: Diagnosis not present

## 2023-09-22 LAB — QUANTIFERON-TB GOLD PLUS
QuantiFERON Nil Value: 0.06 [IU]/mL
QuantiFERON TB1 Ag Value: 0.06 [IU]/mL
QuantiFERON TB2 Ag Value: 0.06 [IU]/mL

## 2023-09-23 ENCOUNTER — Other Ambulatory Visit (HOSPITAL_COMMUNITY): Payer: Self-pay

## 2023-09-24 ENCOUNTER — Telehealth (INDEPENDENT_AMBULATORY_CARE_PROVIDER_SITE_OTHER): Payer: Self-pay | Admitting: *Deleted

## 2023-09-24 ENCOUNTER — Other Ambulatory Visit (HOSPITAL_COMMUNITY): Payer: Self-pay

## 2023-09-24 NOTE — Telephone Encounter (Signed)
 Patient's insurance plan even with prior auth does not cover semaglutide .

## 2023-09-24 NOTE — Telephone Encounter (Signed)
 Please let the patient know about this. If she is willing to pay for it, I can send Wegovy  instead. Thanks

## 2023-09-24 NOTE — Telephone Encounter (Signed)
 Called [facility] and and asked if any semaglutide  brand or tirzepatide was covered and was told by pharmacy. [facility] is no longer covering any of them. Some people are using co pay card for wegovy  but it is still costing with car $550 - 600 per month.

## 2023-09-24 NOTE — Progress Notes (Signed)
 Med submitted through cover my meds and approved.

## 2023-09-24 NOTE — Telephone Encounter (Signed)
 Fax from [employer] ocaliva  has been approved from 09/24/23 - 09/23/24.

## 2023-09-24 NOTE — Telephone Encounter (Signed)
 Patient was notified.

## 2023-09-24 NOTE — Telephone Encounter (Signed)
 Do they cover any other semaglutide  brand or tirzepatide? Thanks

## 2023-09-25 NOTE — Telephone Encounter (Signed)
 The cost will be too much. She did say her husband has same insurance plan and he gets ozempic  covered but also she said he has elevated sugars. I let her know what the pharmacy told me that the insurance is not covering it for her.

## 2023-09-25 NOTE — Telephone Encounter (Signed)
 Thanks, yes for diabetes it is covered, but not for obesity or NASH.

## 2023-09-29 ENCOUNTER — Inpatient Hospital Stay (HOSPITAL_BASED_OUTPATIENT_CLINIC_OR_DEPARTMENT_OTHER): Admitting: Hematology

## 2023-09-29 ENCOUNTER — Inpatient Hospital Stay: Attending: Hematology

## 2023-09-29 VITALS — BP 121/87 | HR 71 | Temp 97.7°F | Resp 18 | Ht 69.5 in | Wt 244.0 lb

## 2023-09-29 DIAGNOSIS — D696 Thrombocytopenia, unspecified: Secondary | ICD-10-CM

## 2023-09-29 DIAGNOSIS — K743 Primary biliary cirrhosis: Secondary | ICD-10-CM | POA: Diagnosis not present

## 2023-09-29 DIAGNOSIS — D693 Immune thrombocytopenic purpura: Secondary | ICD-10-CM | POA: Insufficient documentation

## 2023-09-29 DIAGNOSIS — Z87891 Personal history of nicotine dependence: Secondary | ICD-10-CM | POA: Insufficient documentation

## 2023-09-29 DIAGNOSIS — Z800 Family history of malignant neoplasm of digestive organs: Secondary | ICD-10-CM | POA: Diagnosis not present

## 2023-09-29 LAB — CBC
HCT: 44.9 % (ref 36.0–46.0)
Hemoglobin: 14.6 g/dL (ref 12.0–15.0)
MCH: 30.1 pg (ref 26.0–34.0)
MCHC: 32.5 g/dL (ref 30.0–36.0)
MCV: 92.6 fL (ref 80.0–100.0)
Platelets: 235 K/uL (ref 150–400)
RBC: 4.85 MIL/uL (ref 3.87–5.11)
RDW: 13.5 % (ref 11.5–15.5)
WBC: 7.6 K/uL (ref 4.0–10.5)
nRBC: 0 % (ref 0.0–0.2)

## 2023-09-29 NOTE — Progress Notes (Signed)
 [facility] [address], [place] [address]    Clinic Day:  09/29/2023  Referring physician: Artemisa Bile, MD  Patient Care Team: Artemisa Bile, MD as PCP - General (Internal Medicine)   ASSESSMENT & PLAN:   Assessment: 1.  Immune mediated thrombocytopenia: - Patient seen at the request of Dr. Sammi Crick - CBC on 03/13/2023: PLT-33, Hb-15.7, WBC-6.9. - She has primary biliary cirrhosis, on Ocaliva  for the last few years, dose increased to 10 mg on 02/09/2023. - She denies drinking tonic water /using quinine supplements. - Reports easy bruising on the extremities for the last 2 months.  No active bleeding.  No recent infections or antibiotic use. - Dexamethasone  trial (40 mg daily x 4 days) on 03/19/2023 with platelet improvement from 34 to 177, with drop to 5 on 04/07/2023, transient response. - IVIG 1 g/kg on 04/08/2023 on 04/09/2023   2. Social/Family History:  -Retired. No chemical exposure. Quit tobacco use 2 years ago with 2 cigarettes a day  and off since age 62. -No family history of decreased platelets. Brother died of colon cancer. Father had stomach cancer. Brother has discoid lupus.  No family history of DVT/PE.    Plan: 1.  Immune mediated thrombocytopenia: -She denies any easy bruising or bleeding in the past 4 months. - Labs today: Platelet count is 235.  Hemoglobin and white cell count normal. - She had a good response after the last IVIG in November 2024. - If she has a relapse, we may try IVIG again.  Other options include TPO mimetic avatrombopag with slightly increased risk of thrombosis, SYK inhibitor fostamatinib (diarrhea and hypertension), and rituximab (infusion reaction and infection risk). - Recommend follow-up in 6 months with repeat CBC.  She will call us  to check her platelets sooner should she develop any signs of bleeding/bruising.  2.  Primary biliary cirrhosis: - She remains on Ocaliva  and ursodiol .     No orders of the defined  types were placed in this encounter.     [name],acting as a Neurosurgeon for Paulett Boros, MD.,have documented all relevant documentation on the behalf of Paulett Boros, MD,as directed by  Paulett Boros, MD while in the presence of Paulett Boros, MD.  I, Paulett Boros MD, have reviewed the above documentation for accuracy and completeness, and I agree with the above.    Paulett Boros, MD   5/6/20252:01 PM  CHIEF COMPLAINT:   Diagnosis: Severe thrombocytopenia    Cancer Staging  No matching staging information was found for the patient.    Prior Therapy: Trial of dexamethasone , IVIG  Current Therapy: Observation   HISTORY OF PRESENT ILLNESS:   Oncology History   No history exists.     INTERVAL HISTORY:   Cynthia Donaldson is a 68 y.o. female presenting to clinic today for follow up of Severe thrombocytopenia. She was last seen by me on 05/18/23.  Today, she states that she is doing well overall. Her appetite level is at 80%. Her energy level is at 70%. Evangelyne continues to take ursodiol . She states insurance would not improve Ozempic  for her fatty liver.   PAST MEDICAL HISTORY:   Past Medical History: Past Medical History:  Diagnosis Date   Bile duct stenosis    Hyperlipidemia    Hypothyroidism    PONV (postoperative nausea and vomiting)    Sleep apnea     Surgical History: Past Surgical History:  Procedure Laterality Date   anal fissure repair     BILIARY DILATION  12/26/2021   Procedure: BILIARY DILATION;  Surgeon: Brice Campi Albino Alu., MD;  Location: [facility] ENDOSCOPY;  Service: Gastroenterology;;   BIOPSY  12/26/2021   Procedure: BIOPSY;  Surgeon: Normie Becton., MD;  Location: [facility] ENDOSCOPY;  Service: Gastroenterology;;   CHOLECYSTECTOMY     COLONOSCOPY N/A 12/02/2017   Procedure: COLONOSCOPY;  Surgeon: Ruby Corporal, MD;  Location: [facility] ENDO SUITE;  Service: Endoscopy;  Laterality: N/A;  1030   ERCP      X3   ERCP N/A  12/26/2021   Procedure: ENDOSCOPIC RETROGRADE CHOLANGIOPANCREATOGRAPHY (ERCP);  Surgeon: Normie Becton., MD;  Location: [facility] ENDOSCOPY;  Service: Gastroenterology;  Laterality: N/A;   ESOPHAGOGASTRODUODENOSCOPY N/A 12/26/2021   Procedure: ESOPHAGOGASTRODUODENOSCOPY (EGD);  Surgeon: Normie Becton., MD;  Location: [facility] ENDOSCOPY;  Service: Gastroenterology;  Laterality: N/A;   MYOMECTOMY     REMOVAL OF STONES  12/26/2021   Procedure: REMOVAL OF STONES;  Surgeon: Mansouraty, Albino Alu., MD;  Location: [facility] ENDOSCOPY;  Service: Gastroenterology;;   [address]  12/26/2021   Procedure: [address];  Surgeon: Mansouraty, Albino Alu., MD;  Location: [facility] ENDOSCOPY;  Service: Gastroenterology;;   TONSILLECTOMY      Social History: Social History   Socioeconomic History   Marital status: Married    Spouse name: Not on file   Number of children: Not on file   Years of education: Not on file   Highest education level: Not on file  Occupational History   Occupation: Homemaker  Tobacco Use   Smoking status: Former    Current packs/day: 0.00    Types: Cigarettes    Quit date: 2022    Years since quitting: 3.3   Smokeless tobacco: Never   Tobacco comments:    07/2019  Vaping Use   Vaping status: Never Used  Substance and Sexual Activity   Alcohol use: Yes    Comment: monthly   Drug use: No   Sexual activity: Not Currently    Birth control/protection: Post-menopausal  Other Topics Concern   Not on file  Social History Narrative   Not on file   Social Drivers of Health   Financial Resource Strain: Low Risk  (07/04/2021)   Overall Financial Resource Strain (CARDIA)    Difficulty of Paying Living Expenses: Not hard at all  Food Insecurity: No Food Insecurity (07/04/2021)   Hunger Vital Sign    Worried About Running Out of Food in the Last Year: Never true    Ran Out of Food in the Last Year: Never true  Transportation Needs: No Transportation Needs (07/04/2021)   PRAPARE -  Administrator, Civil Service (Medical): No    Lack of Transportation (Non-Medical): No  Physical Activity: Inactive (07/04/2021)   Exercise Vital Sign    Days of Exercise per Week: 0 days    Minutes of Exercise per Session: 0 min  Stress: No Stress Concern Present (07/04/2021)   [employer] of Occupational Health - Occupational Stress Questionnaire    Feeling of Stress : Not at all  Social Connections: Moderately Isolated (07/04/2021)   Social Connection and Isolation Panel [NHANES]    Frequency of Communication with Friends and Family: More than three times a week    Frequency of Social Gatherings with Friends and Family: Once a week    Attends Religious Services: Never    Database administrator or Organizations: No    Attends Banker Meetings: Never    Marital Status: Married  Catering manager Violence: Not At Risk (07/04/2021)  Humiliation, Afraid, Rape, and Kick questionnaire    Fear of Current or Ex-Partner: No    Emotionally Abused: No    Physically Abused: No    Sexually Abused: No    Family History: Family History  Problem Relation Age of Onset   Hypertension Father    Cancer Father        stomach   Heart disease Mother    Colon cancer Brother    Suicidality Brother    Hypertension Brother    Arthritis Brother    Hypertension Brother    Arthritis Brother    Hypertension Brother    Arthritis Brother    Heart attack Brother    Suicidality Sister    Heart disease Other    Arthritis Other    Cancer Other     Current Medications:  Current Outpatient Medications:    buPROPion  (WELLBUTRIN  XL) 150 MG 24 hr tablet, Take 3 tablets (450 mg total) by mouth daily., Disp: 270 tablet, Rfl: 4   clobetasol  ointment (TEMOVATE ) 0.05 %, Use as directed twice daily for 2 weeks to the affected area, then, use 2-3 times weekly., Disp: 30 g, Rfl: 3   levothyroxine  (SYNTHROID ) 150 MCG tablet, Take 1 tablet (150 mcg total) by mouth daily., Disp: 90 tablet,  Rfl: 4   Obeticholic Acid  (OCALIVA ) 10 MG TABS, Take 10 mg by mouth daily., Disp: 90 tablet, Rfl: 3   Semaglutide -Weight Management 0.25 MG/0.5ML SOAJ, Inject 0.25 mg into the skin once a week for 28 days, THEN 0.5 mg once a week for 28 days, THEN 1 mg once a week for 28 days, THEN 1 mg once a week for 28 days., Disp: 22 mL, Rfl: 0   Semaglutide -Weight Management 0.25 MG/0.5ML SOAJ, Inject 0.25 mg into the skin once a week., Disp: 2 mL, Rfl: 0   Semaglutide -Weight Management 0.5 MG/0.5ML SOAJ, Inject 0.5 mg once a week for 28 days., Disp: 2 mL, Rfl: 0   Semaglutide -Weight Management 1 MG/0.5ML SOAJ, Inject 1mg  once a week for 28 days, Disp: 2 mL, Rfl: 0   Semaglutide -Weight Management 1.7 MG/0.75ML SOAJ, Inject 1.7 mg into the skin once a week for 28 days., Disp: 3 mL, Rfl: 0   Semaglutide -Weight Management 2.4 MG/0.75ML SOAJ, Inject 2.4 mg into the skin once a week., Disp: 3 mL, Rfl: 2   ursodiol  (ACTIGALL ) 500 MG tablet, Take 1 tablet (500 mg total) by mouth 3 (three) times daily., Disp: 270 tablet, Rfl: 4  Current Facility-Administered Medications:    methylPREDNISolone  acetate (DEPO-MEDROL ) injection 40 mg, 40 mg, Intra-articular, Once, Darrin Emerald, MD   Allergies: Allergies  Allergen Reactions   Codeine Nausea And Vomiting    REVIEW OF SYSTEMS:   Review of Systems  Constitutional:  Negative for chills, fatigue and fever.  HENT:   Negative for lump/mass, mouth sores, nosebleeds, sore throat and trouble swallowing.   Eyes:  Negative for eye problems.  Respiratory:  Positive for cough. Negative for shortness of breath.   Cardiovascular:  Negative for chest pain, leg swelling and palpitations.  Gastrointestinal:  Positive for abdominal pain (5/10 severity). Negative for constipation, diarrhea, nausea and vomiting.  Genitourinary:  Negative for bladder incontinence, difficulty urinating, dysuria, frequency, hematuria and nocturia.   Musculoskeletal:  Negative for arthralgias,  back pain, flank pain, myalgias and neck pain.  Skin:  Negative for itching and rash.  Neurological:  Positive for headaches and numbness (and tingling in the hands and feet). Negative for dizziness.  Hematological:  Does not  bruise/bleed easily.  Psychiatric/Behavioral:  Negative for depression, sleep disturbance and suicidal ideas. The patient is not nervous/anxious.   All other systems reviewed and are negative.    VITALS:   Blood pressure 121/87, pulse 71, temperature 97.7 F (36.5 C), temperature source Tympanic, resp. rate 18, height 5' 9.5" (1.765 m), weight 244 lb (110.7 kg), SpO2 97%.  Wt Readings from Last 3 Encounters:  09/29/23 244 lb (110.7 kg)  09/17/23 242 lb 14.4 oz (110.2 kg)  05/18/23 241 lb (109.3 kg)    Body mass index is 35.52 kg/m.  Performance status (ECOG): 1 - Symptomatic but completely ambulatory  PHYSICAL EXAM:   Physical Exam Vitals and nursing note reviewed. Exam conducted with a chaperone present.  Constitutional:      Appearance: Normal appearance.  Cardiovascular:     Rate and Rhythm: Normal rate and regular rhythm.     Pulses: Normal pulses.     Heart sounds: Normal heart sounds.  Pulmonary:     Effort: Pulmonary effort is normal.     Breath sounds: Normal breath sounds.  Abdominal:     Palpations: Abdomen is soft. There is no hepatomegaly, splenomegaly or mass.     Tenderness: There is no abdominal tenderness.  Musculoskeletal:     Right lower leg: No edema.     Left lower leg: No edema.  Lymphadenopathy:     Cervical: No cervical adenopathy.     Right cervical: No superficial, deep or posterior cervical adenopathy.    Left cervical: No superficial, deep or posterior cervical adenopathy.     Upper Body:     Right upper body: No supraclavicular or axillary adenopathy.     Left upper body: No supraclavicular or axillary adenopathy.  Neurological:     General: No focal deficit present.     Mental Status: She is alert and oriented to  person, place, and time.  Psychiatric:        Mood and Affect: Mood normal.        Behavior: Behavior normal.     LABS:      Latest Ref Rng & Units 09/29/2023    1:00 PM 08/17/2023   10:26 AM 07/20/2023    3:12 PM  CBC  WBC 4.0 - 10.5 K/uL 7.6  6.7  7.1   Hemoglobin 12.0 - 15.0 g/dL 78.2  95.6  21.3   Hematocrit 36.0 - 46.0 % 44.9  46.5  45.9   Platelets 150 - 400 K/uL 235  220  241       Latest Ref Rng & Units 03/13/2023   11:40 AM 03/07/2022    1:55 PM 12/29/2021    1:14 AM  CMP  Glucose 70 - 99 mg/dL 91  93  97   BUN 8 - 27 mg/dL 16  13  12    Creatinine 0.57 - 1.00 mg/dL 0.86  5.78  4.69   Sodium 134 - 144 mmol/L 144  141  137   Potassium 3.5 - 5.2 mmol/L 3.9  4.4  3.3   Chloride 96 - 106 mmol/L 108  105  108   CO2 20 - 29 mmol/L 20  22  23    Calcium 8.7 - 10.3 mg/dL 9.1  9.5  8.5   Total Protein 6.0 - 8.5 g/dL 7.1  6.9    Total Bilirubin 0.0 - 1.2 mg/dL 0.6  0.5    Alkaline Phos 44 - 121 IU/L 156  142    AST 0 - 40 IU/L 30  41    ALT 0 - 32 IU/L 43  53       No results found for: "CEA1", "CEA" / No results found for: "CEA1", "CEA" No results found for: "PSA1" No results found for: "WUJ811" No results found for: "CAN125"  Lab Results  Component Value Date   TOTALPROTELP 6.6 03/19/2023   ALBUMINELP 3.7 03/19/2023   A1GS 0.3 03/19/2023   A2GS 0.6 03/19/2023   BETS 1.3 03/19/2023   GAMS 0.7 03/19/2023   MSPIKE Not Observed 03/19/2023   SPEI Comment 03/19/2023   Lab Results  Component Value Date   TIBC 374 12/26/2021   FERRITIN 362 (H) 12/26/2021   IRONPCTSAT 35 (H) 12/26/2021   Lab Results  Component Value Date   LDH 177 03/19/2023     STUDIES:   No results found.

## 2023-09-29 NOTE — Patient Instructions (Addendum)
 [facility] at [facility] Discharge Instructions   You were seen and examined today by Dr. Cheree Cords.  He reviewed the results of your lab work which are normal/stable. Your platelet counts is 235 today.   We will see you back in 6 months. We will repeat lab work at that time.   Return as scheduled.    Thank you for choosing [facility] at [facility] to provide your oncology and hematology care.  To afford each patient quality time with our provider, please arrive at least 15 minutes before your scheduled appointment time.   If you have a lab appointment with the Cancer Center please come in thru the Main Entrance and check in at the main information desk.  You need to re-schedule your appointment should you arrive 10 or more minutes late.  We strive to give you quality time with our providers, and arriving late affects you and other patients whose appointments are after yours.  Also, if you no show three or more times for appointments you may be dismissed from the clinic at the providers discretion.     Again, thank you for choosing [facility].  Our hope is that these requests will decrease the amount of time that you wait before being seen by our physicians.       _____________________________________________________________  Should you have questions after your visit to [facility], please contact our office at [phone] and follow the prompts.  Our office hours are 8:00 a.m. and 4:30 p.m. Monday - Friday.  Please note that voicemails left after 4:00 p.m. may not be returned until the following business day.  We are closed weekends and major holidays.  You do have access to a nurse 24-7, just call the main number to the clinic [phone] and do not press any options, hold on the line and a nurse will answer the phone.    For prescription refill requests, have your pharmacy contact our office and allow 72 hours.     Due to Covid, you will need to wear a mask upon entering the hospital. If you do not have a mask, a mask will be given to you at the Main Entrance upon arrival. For doctor visits, patients may have 1 support person age 5 or older with them. For treatment visits, patients can not have anyone with them due to social distancing guidelines and our immunocompromised population.

## 2023-10-05 ENCOUNTER — Other Ambulatory Visit (HOSPITAL_COMMUNITY): Payer: Self-pay

## 2023-10-06 ENCOUNTER — Telehealth (INDEPENDENT_AMBULATORY_CARE_PROVIDER_SITE_OTHER): Payer: Self-pay

## 2023-10-06 NOTE — Telephone Encounter (Signed)
 [facility] called stating they need a PA on patient Ocaliva .

## 2023-10-06 NOTE — Telephone Encounter (Signed)
-------  Fax Transmission Report-------  To:               Recipient at [phone] Subject:          [name] for Ocaliva  Result:           The transmission was successful. Explanation:      All Pages Ok Pages Sent:       7 Connect Time:     3 minutes, 8 seconds Transmit Time:    10/06/2023 10:21 Transfer Rate:    14400 Status Code:      0000 Retry Count:      0 Job Id:           [id number] Unique Id:        [id number] Fax Line:         17 Fax Server:       [employer]

## 2023-10-06 NOTE — Telephone Encounter (Signed)
 I spoke with Thurston Flow at [place] and let her know we have gotten an approval on the patient Ocaliva  gave her the approval date,and Auth Number. She has asked that we fax this information over to her which, I did along with the patient profile with [facility], showing the medication is approved from 09/24/2023-09/23/2024.   I spoke with Tahd with [employer] he says the Ocaliva  5 mg was approved, and the patient had a claim for Ocaliva  10 mg ran on 10/05/2023 that the Authorization for the 5 mg is covering the 10 mg Ocaliva . No New authorization on the 10 mg is needed until 09/23/2024.

## 2023-10-07 DIAGNOSIS — H5789 Other specified disorders of eye and adnexa: Secondary | ICD-10-CM | POA: Diagnosis not present

## 2023-10-07 DIAGNOSIS — H2513 Age-related nuclear cataract, bilateral: Secondary | ICD-10-CM | POA: Diagnosis not present

## 2023-10-13 ENCOUNTER — Ambulatory Visit (INDEPENDENT_AMBULATORY_CARE_PROVIDER_SITE_OTHER): Payer: Commercial Managed Care - PPO | Admitting: Otolaryngology

## 2023-10-13 VITALS — BP 91/60 | HR 71

## 2023-10-13 DIAGNOSIS — E039 Hypothyroidism, unspecified: Secondary | ICD-10-CM | POA: Diagnosis not present

## 2023-10-13 DIAGNOSIS — G4733 Obstructive sleep apnea (adult) (pediatric): Secondary | ICD-10-CM | POA: Diagnosis not present

## 2023-10-13 DIAGNOSIS — H903 Sensorineural hearing loss, bilateral: Secondary | ICD-10-CM | POA: Diagnosis not present

## 2023-10-13 DIAGNOSIS — K8309 Other cholangitis: Secondary | ICD-10-CM | POA: Diagnosis not present

## 2023-10-13 DIAGNOSIS — E785 Hyperlipidemia, unspecified: Secondary | ICD-10-CM | POA: Diagnosis not present

## 2023-10-13 DIAGNOSIS — Z79899 Other long term (current) drug therapy: Secondary | ICD-10-CM | POA: Diagnosis not present

## 2023-10-14 DIAGNOSIS — H903 Sensorineural hearing loss, bilateral: Secondary | ICD-10-CM | POA: Insufficient documentation

## 2023-10-14 NOTE — Progress Notes (Signed)
 Patient ID: Cynthia Donaldson, female   DOB: 02/20/1956, 68 y.o.   MRN: [id number]  Follow-up: Bilateral hearing loss  HPI: The patient is a 68 year old female who returns today for her follow-up evaluation.  The patient was last seen in May 2024.  At that time, she was noted to have bilateral progressive hearing loss.  She was fitted with bilateral hearing aids.  The patient returns today reporting no significant change in her hearing.  She denies any otalgia, otorrhea, or vertigo.  Exam: General: Communicates without difficulty, well nourished, no acute distress. Head: Normocephalic, no evidence injury, no tenderness, facial buttresses intact without stepoff. Face/sinus: No tenderness to palpation and percussion. Facial movement is normal and symmetric. Eyes: PERRL, EOMI. No scleral icterus, conjunctivae clear. Neuro: CN II exam reveals vision grossly intact.  No nystagmus at any point of gaze. Ears: Auricles well formed without lesions.  Ear canals are intact without mass or lesion.  No erythema or edema is appreciated.  A small amount of cerumen is noted.  The TMs are intact without fluid. Nose: External evaluation reveals normal support and skin without lesions.  Dorsum is intact.  Anterior rhinoscopy reveals normal mucosa over anterior aspect of inferior turbinates and intact septum.  No purulence noted. Oral:  Oral cavity and oropharynx are intact, symmetric, without erythema or edema.  Mucosa is moist without lesions. Neck: Full range of motion without pain.  There is no significant lymphadenopathy.  No masses palpable.  Thyroid  bed within normal limits to palpation.  Parotid glands and submandibular glands equal bilaterally without mass.  Trachea is midline. Neuro:  CN 2-12 grossly intact.   Assessment: 1.  Subjectively stable bilateral high-frequency sensorineural hearing loss. 2.  A small amount of cerumen is noted and removed.  Her tympanic membranes and middle ear spaces are normal.  Plan: 1.   The physical exam findings are reviewed with the patient. 2.  Continue the use of her hearing aids. 3.  The patient will return for reevaluation in 1 year.

## 2023-10-20 ENCOUNTER — Other Ambulatory Visit (HOSPITAL_COMMUNITY): Payer: Self-pay

## 2023-10-20 DIAGNOSIS — K7581 Nonalcoholic steatohepatitis (NASH): Secondary | ICD-10-CM | POA: Diagnosis not present

## 2023-10-20 DIAGNOSIS — E039 Hypothyroidism, unspecified: Secondary | ICD-10-CM | POA: Diagnosis not present

## 2023-10-20 DIAGNOSIS — E785 Hyperlipidemia, unspecified: Secondary | ICD-10-CM | POA: Diagnosis not present

## 2023-10-20 DIAGNOSIS — G4700 Insomnia, unspecified: Secondary | ICD-10-CM | POA: Diagnosis not present

## 2023-10-20 MED ORDER — TRIAMCINOLONE ACETONIDE 0.1 % EX CREA
1.0000 | TOPICAL_CREAM | Freq: Two times a day (BID) | CUTANEOUS | 2 refills | Status: AC
  Filled 2023-10-20 – 2023-10-26 (×2): qty 60, 30d supply, fill #0
  Filled 2024-03-08: qty 60, 30d supply, fill #1

## 2023-10-22 DIAGNOSIS — G4733 Obstructive sleep apnea (adult) (pediatric): Secondary | ICD-10-CM | POA: Diagnosis not present

## 2023-10-26 ENCOUNTER — Other Ambulatory Visit: Payer: Self-pay

## 2023-10-26 ENCOUNTER — Other Ambulatory Visit (HOSPITAL_COMMUNITY): Payer: Self-pay

## 2023-10-26 ENCOUNTER — Encounter (HOSPITAL_COMMUNITY): Payer: Self-pay

## 2023-11-02 ENCOUNTER — Other Ambulatory Visit: Payer: Self-pay

## 2023-11-02 ENCOUNTER — Other Ambulatory Visit (HOSPITAL_COMMUNITY): Payer: Self-pay

## 2023-11-02 ENCOUNTER — Encounter: Payer: Self-pay | Admitting: Hematology

## 2023-11-02 ENCOUNTER — Telehealth (INDEPENDENT_AMBULATORY_CARE_PROVIDER_SITE_OTHER): Payer: Self-pay

## 2023-11-02 MED ORDER — URSODIOL 500 MG PO TABS
500.0000 mg | ORAL_TABLET | Freq: Three times a day (TID) | ORAL | 4 refills | Status: AC
  Filled 2023-11-02: qty 270, 90d supply, fill #0
  Filled 2024-03-08: qty 270, 90d supply, fill #1
  Filled 2024-06-01: qty 270, 90d supply, fill #2

## 2023-11-02 NOTE — Telephone Encounter (Signed)
 FYI: Patient called today stating Dr. Hildy Lowers is sending in and managing Ozempic  through a pharmacy in [place].

## 2023-11-02 NOTE — Telephone Encounter (Signed)
 Thanks

## 2023-11-05 ENCOUNTER — Other Ambulatory Visit (INDEPENDENT_AMBULATORY_CARE_PROVIDER_SITE_OTHER): Payer: Self-pay | Admitting: Gastroenterology

## 2023-11-05 ENCOUNTER — Telehealth (INDEPENDENT_AMBULATORY_CARE_PROVIDER_SITE_OTHER): Payer: Self-pay

## 2023-11-05 DIAGNOSIS — R1011 Right upper quadrant pain: Secondary | ICD-10-CM

## 2023-11-05 NOTE — Addendum Note (Signed)
 Addended by: CASTANEDA MAYORGA, Kiernan Atkerson on: 11/05/2023 01:23 PM   Modules accepted: Orders

## 2023-11-05 NOTE — Telephone Encounter (Signed)
 Patient calling today due to having issues with RUQ pain again, Gnawing pain for the last several days. Patient with history of having stones in her bile ducts has had several Ercp's. Pain is the same as when this happened in the past. Patient has some nausea denies any vomiting or fevers.  She says that at last office visit it was discussed if this happened again we would need to have some blood work at [facility]. Please advise.

## 2023-11-05 NOTE — Telephone Encounter (Signed)
 Thanks, spoke with patient's husband as well. I placed orders for CBC and CMP ASAP at [facility]. If presenting worsening symptoms, may need to go to the ER for further evaluation, preferably [facility] or [facility] as they have ERCP capability if needed.

## 2023-11-05 NOTE — Telephone Encounter (Signed)
 I spoke with the patient and made her aware per Dr. Sammi Crick, spoke with patient's husband as well. I placed orders for CBC and CMP ASAP at [facility]. If presenting worsening symptoms, may need to go to the ER for further evaluation, preferably [facility] or [facility] as they have ERCP capability if needed. Patient states understanding.

## 2023-11-06 LAB — CBC WITH DIFFERENTIAL/PLATELET

## 2023-11-07 LAB — CBC WITH DIFFERENTIAL/PLATELET
Basos: 1 %
EOS (ABSOLUTE): 0.1 10*3/uL (ref 0.0–0.2)
Eos: 2 %
Hematocrit: 49 % — ABNORMAL HIGH (ref 34.0–46.6)
Hemoglobin: 15.7 g/dL (ref 11.1–15.9)
Immature Granulocytes: 0 %
Immature Granulocytes: 0 10*3/uL (ref 0.0–0.1)
Lymphs: 20 %
MCH: 30.8 pg (ref 26.6–33.0)
MCHC: 32 g/dL (ref 31.5–35.7)
MCV: 96 fL (ref 79–97)
Monocytes Absolute: 0.2 10*3/uL (ref 0.0–0.4)
Monocytes Absolute: 0.5 10*3/uL (ref 0.1–0.9)
Monocytes: 7 %
Neutrophils Absolute: 1.6 10*3/uL (ref 0.7–3.1)
Neutrophils Absolute: 5.6 10*3/uL (ref 1.4–7.0)
Neutrophils: 70 %
Platelets: 171 10*3/uL (ref 150–450)
RBC: 5.1 x10E6/uL (ref 3.77–5.28)
RDW: 12.8 % (ref 11.7–15.4)
WBC: 7.9 10*3/uL (ref 3.4–10.8)

## 2023-11-07 LAB — COMPREHENSIVE METABOLIC PANEL WITH GFR
ALT: 59 IU/L — ABNORMAL HIGH (ref 0–32)
AST: 44 IU/L — ABNORMAL HIGH (ref 0–40)
Albumin: 4.4 g/dL (ref 3.9–4.9)
Alkaline Phosphatase: 163 IU/L — ABNORMAL HIGH (ref 44–121)
BUN/Creatinine Ratio: 16 (ref 12–28)
BUN: 16 mg/dL (ref 8–27)
Bilirubin Total: 0.3 mg/dL (ref 0.0–1.2)
CO2: 22 mmol/L (ref 20–29)
Calcium: 9.4 mg/dL (ref 8.7–10.3)
Chloride: 105 mmol/L (ref 96–106)
Creatinine, Ser: 0.97 mg/dL (ref 0.57–1.00)
Globulin, Total: 2.5 g/dL (ref 1.5–4.5)
Glucose: 101 mg/dL — ABNORMAL HIGH (ref 70–99)
Potassium: 4.6 mmol/L (ref 3.5–5.2)
Sodium: 142 mmol/L (ref 134–144)
Total Protein: 6.9 g/dL (ref 6.0–8.5)
eGFR: 64 mL/min/{1.73_m2} (ref >59)

## 2023-11-09 ENCOUNTER — Ambulatory Visit (INDEPENDENT_AMBULATORY_CARE_PROVIDER_SITE_OTHER): Payer: Self-pay | Admitting: Gastroenterology

## 2023-11-16 DIAGNOSIS — G4733 Obstructive sleep apnea (adult) (pediatric): Secondary | ICD-10-CM | POA: Diagnosis not present

## 2023-11-22 DIAGNOSIS — G4733 Obstructive sleep apnea (adult) (pediatric): Secondary | ICD-10-CM | POA: Diagnosis not present

## 2023-12-04 ENCOUNTER — Other Ambulatory Visit (INDEPENDENT_AMBULATORY_CARE_PROVIDER_SITE_OTHER): Payer: Self-pay

## 2023-12-04 DIAGNOSIS — K7581 Nonalcoholic steatohepatitis (NASH): Secondary | ICD-10-CM

## 2023-12-04 DIAGNOSIS — K743 Primary biliary cirrhosis: Secondary | ICD-10-CM

## 2023-12-22 DIAGNOSIS — G4733 Obstructive sleep apnea (adult) (pediatric): Secondary | ICD-10-CM | POA: Diagnosis not present

## 2023-12-28 ENCOUNTER — Telehealth (INDEPENDENT_AMBULATORY_CARE_PROVIDER_SITE_OTHER): Payer: Self-pay | Admitting: Audiology

## 2023-12-28 NOTE — Telephone Encounter (Signed)
 Called patient regarding her hearing aid not holding a long charge.  I told her she could bring them in and Dr. Tiney would send them out.  Patient stated that her hearing aids are approximately 68 year old and barely hold a 12 hour charge which is less than when she first purchased them.  She also stated that the Right hearing aid seems to be worse at holding a charge.    Patient wanted to know how long it would take to get them back.  I called her back and let her know that it typically takes about 2 weeks and offered to send one hearing aid out at a time so that she would still have one.  Patient stated that she wants to wait to send them out until after her reunion.  She stated that she will bring them in at the end of the month.

## 2024-01-18 DIAGNOSIS — K743 Primary biliary cirrhosis: Secondary | ICD-10-CM | POA: Diagnosis not present

## 2024-01-18 DIAGNOSIS — Z79899 Other long term (current) drug therapy: Secondary | ICD-10-CM | POA: Diagnosis not present

## 2024-01-18 DIAGNOSIS — R7401 Elevation of levels of liver transaminase levels: Secondary | ICD-10-CM | POA: Diagnosis not present

## 2024-01-18 DIAGNOSIS — K7581 Nonalcoholic steatohepatitis (NASH): Secondary | ICD-10-CM | POA: Diagnosis not present

## 2024-01-19 ENCOUNTER — Encounter (INDEPENDENT_AMBULATORY_CARE_PROVIDER_SITE_OTHER): Payer: Self-pay

## 2024-01-19 ENCOUNTER — Ambulatory Visit: Payer: Self-pay | Admitting: Gastroenterology

## 2024-01-19 LAB — COMPREHENSIVE METABOLIC PANEL WITH GFR
ALT: 39 IU/L — ABNORMAL HIGH (ref 0–32)
AST: 28 IU/L (ref 0–40)
Albumin: 4.5 g/dL (ref 3.9–4.9)
Alkaline Phosphatase: 143 IU/L — ABNORMAL HIGH (ref 44–121)
BUN/Creatinine Ratio: 15 (ref 12–28)
BUN: 15 mg/dL (ref 8–27)
Bilirubin Total: 0.8 mg/dL (ref 0.0–1.2)
CO2: 23 mmol/L (ref 20–29)
Calcium: 9.5 mg/dL (ref 8.7–10.3)
Chloride: 104 mmol/L (ref 96–106)
Creatinine, Ser: 0.97 mg/dL (ref 0.57–1.00)
Globulin, Total: 2.3 g/dL (ref 1.5–4.5)
Glucose: 81 mg/dL (ref 70–99)
Potassium: 4.6 mmol/L (ref 3.5–5.2)
Sodium: 140 mmol/L (ref 134–144)
Total Protein: 6.8 g/dL (ref 6.0–8.5)
eGFR: 64 mL/min/1.73 (ref >59)

## 2024-01-22 DIAGNOSIS — G4733 Obstructive sleep apnea (adult) (pediatric): Secondary | ICD-10-CM | POA: Diagnosis not present

## 2024-01-29 ENCOUNTER — Other Ambulatory Visit (INDEPENDENT_AMBULATORY_CARE_PROVIDER_SITE_OTHER): Payer: Self-pay | Admitting: Gastroenterology

## 2024-01-29 DIAGNOSIS — K743 Primary biliary cirrhosis: Secondary | ICD-10-CM

## 2024-02-08 ENCOUNTER — Other Ambulatory Visit (HOSPITAL_BASED_OUTPATIENT_CLINIC_OR_DEPARTMENT_OTHER): Payer: Self-pay

## 2024-02-11 ENCOUNTER — Encounter (INDEPENDENT_AMBULATORY_CARE_PROVIDER_SITE_OTHER): Payer: Self-pay | Admitting: Gastroenterology

## 2024-02-11 ENCOUNTER — Ambulatory Visit (INDEPENDENT_AMBULATORY_CARE_PROVIDER_SITE_OTHER): Admitting: Gastroenterology

## 2024-02-11 VITALS — BP 132/76 | HR 86 | Temp 97.8°F | Ht 69.5 in | Wt 226.6 lb

## 2024-02-11 DIAGNOSIS — K7581 Nonalcoholic steatohepatitis (NASH): Secondary | ICD-10-CM | POA: Diagnosis not present

## 2024-02-11 DIAGNOSIS — K8030 Calculus of bile duct with cholangitis, unspecified, without obstruction: Secondary | ICD-10-CM | POA: Diagnosis not present

## 2024-02-11 DIAGNOSIS — K743 Primary biliary cirrhosis: Secondary | ICD-10-CM

## 2024-02-11 MED ORDER — LIVDELZI 10 MG PO CAPS
10.0000 mg | ORAL_CAPSULE | Freq: Every day | ORAL | 3 refills | Status: AC
Start: 2024-02-11 — End: ?

## 2024-02-11 NOTE — Progress Notes (Addendum)
 Toribio Fortune, M.D. Gastroenterology & Hepatology [facility] Gastroenterology [address] [place], [place] [address]  Primary Care Physician: Sheryle Carwin, MD [address] [place] [address]  I will communicate my assessment and recommendations to the referring MD via EMR.  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 female with history of Nash, obesity, depression, hypothyroidism, ITP, primary biliary cholangitis and recurrent choledocholithiasis, who presents for follow up of PBC and MASH.  Patient is coming to discuss options for PBC as Ocaliva  has been discontinued from the market in [place] .  The patient was last seen on 09/17/2023. At that time, the patient patient had blood workup checked.  She was also started on semaglutide  for weight loss and NASH.  Most recent blood workup on 01/18/2024 showed alkaline phosphatase 143, AST 28, ALT 39, total bilirubin 0.8, albumin 4.5.  Patient has had some nausea with semaglutide  0.5 mg weekly SQ. Has had some nausea so has increased this slowly. Interested in having Wegovy  prescribed as it is currently approved for MASH. No constipation or diarrhea now.  Patient denies any other complaints.  The patient denies having any  vomiting, fever, chills, hematochezia, melena, hematemesis, abdominal distention, abdominal pain, jaundice, pruritus. Lost 16 lb in last two months.  Last EGD: 2019 - Normal esophagus. - Z-line irregular, 39 cm from the incisors. Biopsied. - A single gastric polyp. Biopsied. - Erosive gastropathy. - Normal cardia, gastric fundus, gastric body and pylorus. - Normal duodenal bulb and second portion of the duodenum.   Last Colonoscopy: 11/2017 - Two small polyps at the splenic flexure and in the transverse colon. Biopsied. - Diverticulosis in the sigmoid colon. - External  hemorrhoids.   Path 1. Stomach, polyp(s) - HYPERPLASTIC GASTRIC POLYP - NO HIGH GRADE DYSPLASIA OR MALIGNANCY IDENTIFIED 2. Esophagogastric junction, biopsy - SQUAMOCOLUMNAR JUNCTION WITH CHRONIC INFLAMMATION - NO INTESTINAL METAPLASIA, DYSPLASIA OR MALIGNANCY IDENTIFIED 3. Colon, polyp(s), transverse and splenic flexure - POLYPOID FRAGMENTS OF COLONIC MUCOSA WITH LYMPHOID AGGREGATE(S) - NO HIGH GRADE DYSPLASIA OR MALIGNANCY IDENTIFIED - SEE COMMEN   Recommended repeat colonoscopy in 5 years  Past Medical History: Past Medical History:  Diagnosis Date   Bile duct stenosis    Hyperlipidemia    Hypothyroidism    PONV (postoperative nausea and vomiting)    Sleep apnea     Past Surgical History: Past Surgical History:  Procedure Laterality Date   anal fissure repair     BILIARY DILATION  12/26/2021   Procedure: BILIARY DILATION;  Surgeon: Wilhelmenia Aloha Raddle., MD;  Location[facility] ENDOSCOPY;  Service: Gastroenterology;;   BIOPSY  12/26/2021   Procedure: BIOPSY;  Surgeon: Wilhelmenia Aloha Raddle., MD;  Location: [facility] ENDOSCOPY;  Service: Gastroenterology;;   CHOLECYSTECTOMY     COLONOSCOPY N/A 12/02/2017   Procedure: COLONOSCOPY;  Surgeon: Golda Claudis PENNER, MD;  Location: [facility] ENDO SUITE;  Service: Endoscopy;  Laterality: N/A;  1030   ERCP      X3   ERCP N/A 12/26/2021   Procedure: ENDOSCOPIC RETROGRADE CHOLANGIOPANCREATOGRAPHY (ERCP);  Surgeon: Wilhelmenia Aloha Raddle., MD;  Location: [facility] ENDOSCOPY;  Service: Gastroenterology;  Laterality: N/A;   ESOPHAGOGASTRODUODENOSCOPY N/A 12/26/2021   Procedure: ESOPHAGOGASTRODUODENOSCOPY (EGD);  Surgeon: Wilhelmenia Aloha Raddle., MD;  Location: [facility] ENDOSCOPY;  Service: Gastroenterology;  Laterality: N/A;   MYOMECTOMY     REMOVAL OF STONES  12/26/2021   Procedure: REMOVAL OF STONES;  Surgeon:  Mansouraty, Aloha Raddle., MD;  Location: [facility] ENDOSCOPY;  Service: Gastroenterology;;   ANNETT  12/26/2021   Procedure: ANNETT;  Surgeon: Mansouraty,  Aloha Raddle., MD;  Location: [facility] ENDOSCOPY;  Service: Gastroenterology;;   TONSILLECTOMY      Family History: Family History  Problem Relation Age of Onset   Hypertension Father    Cancer Father        stomach   Heart disease Mother    Colon cancer Brother    Suicidality Brother    Hypertension Brother    Arthritis Brother    Hypertension Brother    Arthritis Brother    Hypertension Brother    Arthritis Brother    Heart attack Brother    Suicidality Sister    Heart disease Other    Arthritis Other    Cancer Other     Social History: Social History   Tobacco Use  Smoking Status Former   Current packs/day: 0.00   Types: Cigarettes   Quit date: 2022   Years since quitting: 3.7  Smokeless Tobacco Never  Tobacco Comments   07/2019   Social History   Substance and Sexual Activity  Alcohol Use Yes   Comment: monthly   Social History   Substance and Sexual Activity  Drug Use No    Allergies: Allergies  Allergen Reactions   Codeine Nausea And Vomiting    Medications: Current Outpatient Medications  Medication Sig Dispense Refill   buPROPion  (WELLBUTRIN  XL) 150 MG 24 hr tablet Take 3 tablets (450 mg total) by mouth daily. 270 tablet 4   clobetasol  ointment (TEMOVATE ) 0.05 % Use as directed twice daily for 2 weeks to the affected area, then, use 2-3 times weekly. (Patient taking differently: as needed. Use as directed twice daily for 2 weeks to the affected area, then, use 2-3 times weekly.) 30 g 3   levothyroxine  (SYNTHROID ) 150 MCG tablet Take 1 tablet (150 mcg total) by mouth daily. 90 tablet 4   Semaglutide -Weight Management 0.5 MG/0.5ML SOAJ Inject 0.5 mg once a week for 28 days. 2 mL 0   triamcinolone  cream (KENALOG ) 0.1 % Apply sparingly to affected areas 2 times daily. 60 g 2   ursodiol  (ACTIGALL ) 500 MG tablet Take 1 tablet (500 mg total) by mouth 3 (three) times daily. 270 tablet 4   OCALIVA  10 MG TABS TAKE 1 TABLET DAILY (Patient not taking: Reported on  02/11/2024) 30 tablet 5   Semaglutide -Weight Management 1 MG/0.5ML SOAJ Inject 1mg  once a week for 28 days (Patient not taking: Reported on 02/11/2024) 2 mL 0   Semaglutide -Weight Management 2.4 MG/0.75ML SOAJ Inject 2.4 mg into the skin once a week. (Patient not taking: Reported on 02/11/2024) 3 mL 2   Current Facility-Administered Medications  Medication Dose Route Frequency Provider Last Rate Last Admin   methylPREDNISolone  acetate (DEPO-MEDROL ) injection 40 mg  40 mg Intra-articular Once Margrette Taft BRAVO, MD        Review of Systems: GENERAL: negative for malaise, night sweats HEENT: No changes in hearing or vision, no nose bleeds or other nasal problems. NECK: Negative for lumps, goiter, pain and significant neck swelling RESPIRATORY: Negative for cough, wheezing CARDIOVASCULAR: Negative for chest pain, leg swelling, palpitations, orthopnea GI: SEE HPI MUSCULOSKELETAL: Negative for joint pain or swelling, back pain, and muscle pain. SKIN: Negative for lesions, rash PSYCH: Negative for sleep disturbance, mood disorder and recent psychosocial stressors. HEMATOLOGY Negative for prolonged bleeding, bruising easily, and swollen nodes. ENDOCRINE: Negative for cold or heat intolerance, polyuria, polydipsia  and goiter. NEURO: negative for tremor, gait imbalance, syncope and seizures. The remainder of the review of systems is noncontributory.   Physical Exam: BP 132/76 (BP Location: Left Arm, Patient Position: Sitting, Cuff Size: Large)   Pulse 86   Temp 97.8 F (36.6 C) (Temporal)   Ht 5' 9.5 (1.765 m)   Wt 226 lb 9.6 oz (102.8 kg)   BMI 32.98 kg/m  GENERAL: The patient is AO x3, in no acute distress. HEENT: Head is normocephalic and atraumatic. EOMI are intact. Mouth is well hydrated and without lesions. NECK: Supple. No masses LUNGS: Clear to auscultation. No presence of rhonchi/wheezing/rales. Adequate chest expansion HEART: RRR, normal s1 and s2. ABDOMEN: Soft, nontender, no  guarding, no peritoneal signs, and nondistended. BS +. No masses. EXTREMITIES: Without any cyanosis, clubbing, rash, lesions or edema. NEUROLOGIC: AOx3, no focal motor deficit. SKIN: no jaundice, no rashes  Imaging/Labs: as above  I personally reviewed and interpreted the available labs, imaging and endoscopic files.  Impression and Plan: Cynthia Donaldson is a 68 y.o. female with history of Cynthia Donaldson, obesity, depression, hypothyroidism, ITP, primary biliary cholangitis and recurrent choledocholithiasis, who presents for follow up of PBC and MASH.  The patient has had a history of PBC managed for multiple years with ursodiol .  For the last year and a half she has been managed with combination therapy with obeticholic acid .  She has not presented any changes concerning for liver cirrhosis.  We had to increase her dose from 5 mg to 10 mg due to suboptimal response as alkaline phosphatase kept elevated.  She had partial improvement although she never reached the ideal control of her PBC as her alkaline phosphatase remained higher than 1 times the upper limit of normal as evidenced in her most recent blood workup.  In fact, her total bilirubin has increased to more than 0.6, which is worrisome.  Also, unfortunately, Ocaliva  has been pulled out of the market in the [place] .  Due to this, we held a thorough discussion with the patient regarding the importance of trying a new agent in conjunction with ursodiol  to manage her PBC.  We discussed the current options include Livdelzi  and Iqirvo.  Will start the patient on Livdelzi  today and we will recheck blood workup in 1 month per protocol.  She may need to have further evaluation of liver fibrosis in the future with elastography.  Regarding her liver disease, her current condition is complicated by the coexistence of MASH, for which she is currently on semaglutide  for weight loss.  She has achieved some significant weight loss.  Needs to continue with high  protein intake.  May request switching to Wegovy  as it is now approved for management of MASH.  Finally, she is due for colorectal cancer screening given her history of colon polyps.  Will proceed with this in the November.  -Start Livdelzi  10 mg qday -Check CBC and CMP in- 1 month -Continue ursodiol  500 mg 3 times a day -Continue semaglutide  injections weekly per protocol -Schedule colonoscopy in mid November -Stop Ocaliva   All questions were answered.      Toribio Fortune, MD Gastroenterology and Hepatology [facility] Gastroenterology

## 2024-02-11 NOTE — Patient Instructions (Addendum)
 Start Livdelzi  10 mg qday Perform blood workup in 1 month Continue ursodiol  500 mg 3 times a day Continue semaglutide  injections weekly per protocol Schedule colonoscopy in mid November

## 2024-02-12 ENCOUNTER — Telehealth: Payer: Self-pay | Admitting: *Deleted

## 2024-02-12 ENCOUNTER — Telehealth (INDEPENDENT_AMBULATORY_CARE_PROVIDER_SITE_OTHER): Payer: Self-pay

## 2024-02-12 NOTE — Telephone Encounter (Signed)
 I spoke with Livdelzi  rep Emmalene Mango this morning and she says they have the patient information and will be processing this.    This message was sent via FAXCOM, a product from [employer]. http://www.biscom.com/                    -------Fax Transmission Report-------  To:               Recipient at [phone] Subject:          Atten: Wayne Leer Start on Livdelzi  for Patient J.R. Result:           The transmission was successful. Explanation:      All Pages Ok Pages Sent:       35 Connect Time:     26 minutes, 8 seconds Transmit Time:    02/12/2024 07:49 Transfer Rate:    14400 Status Code:      0000 Retry Count:      0 Job Id:           [id number] Unique Id:        [id number] Fax Line:         31 Fax Server:       [id number]

## 2024-02-12 NOTE — Telephone Encounter (Signed)
 Called pt to schedule colonoscopy with Dr. Eartha. She wants 1st week of December. Advised will call once we receive that schedule

## 2024-02-12 NOTE — Telephone Encounter (Signed)
 Per Dr. Eartha, Start patient on Livdelzi . I have had patient sign consents and the Doctor has sign forms. I have faxed the enrollment forms along with records to [facility]. Awaiting determination.

## 2024-02-15 NOTE — Telephone Encounter (Signed)
 Thanks for the update

## 2024-02-15 NOTE — Telephone Encounter (Signed)
 APPROVAL 02/15/2024 Sierria Pegg [address] [place], [place] [id number] Member DOB: [birth year] Prior Authorization Reference Number: [id number] Plan Name: [place] [employer] Plan Code: PHI22 RE: Prior Authorization Approval [employer] is a Tourist information centre manager company that provides services to members of [employer]. Certain prescription drugs or services require prior authorization before they can be covered by your benefit plan. When this occurs, your healthcare practitioner will contact [employer] to request prior authorization. This letter is to notify you that your prior authorization request for LIVDELZI  10 MG CAPSULE has been approved based upon the information we received from your healthcare practitioner. Benefits for all services are subject to terms, conditions and eligibility as outlined in the benefit documentation in effect at the time services are provided. The authorization is effective from 02/15/2024 to 02/13/2025, as long as you are enrolled as a member of your current health plan. The request was approved with a quantity restriction. This has been approved for a max daily dosage of 1.0. Should you require additional medication beyond the approved quantities and dates, your healthcare practitioner must submit an additional prior authorization request upon expiration of this approval. Sincerely, Prior Authorization Department cc: TORIBIO FLAVORS This communication may contain information that is privileged, confidential, and exempt from disclosure under applicable law, including but not limited to, HIPAA. If you received this communication in error, you are hereby notified that any use or distribution of this communication is prohibited. Please immediately contact the original sender by calling the contact number noted, return all original documents, and destroy any copies.  [id number] ? [place] [address] ? [phone] ?  [email or web address] APPROVAL

## 2024-02-15 NOTE — Telephone Encounter (Signed)
 I spoke with Emmalene Mango with Livdelzi  and made aware the patient was approved for this medication.   Per Emmalene Mango Rep.      They we will assist with patient assistance.  When the pharmacy team reaches out to the patient, they will work with them to get anything needed in relation to that- or to the office as needed.     I will keep an eye on this one and let you know of any updates from here.     If you have any other new starts or patients that you're switching, feel free to reply here and give me a heads up and that will allow me to get eyes on them a little quicker!     Have a great one,  Emmalene

## 2024-02-16 NOTE — Telephone Encounter (Signed)
 I spoke with the patient and made her aware per [employer] the medication is approved from 02/15/2024-02/13/2025, and Wayne would be in contact with the patient to set up co pay assistance and shipping details. I asked the patient to please call me and let me know when she starts the medication. Patient states understanding.

## 2024-02-22 DIAGNOSIS — G4733 Obstructive sleep apnea (adult) (pediatric): Secondary | ICD-10-CM | POA: Diagnosis not present

## 2024-03-08 ENCOUNTER — Other Ambulatory Visit: Payer: Self-pay

## 2024-03-08 ENCOUNTER — Other Ambulatory Visit (HOSPITAL_COMMUNITY): Payer: Self-pay

## 2024-03-09 ENCOUNTER — Encounter (INDEPENDENT_AMBULATORY_CARE_PROVIDER_SITE_OTHER): Payer: Self-pay | Admitting: Gastroenterology

## 2024-03-09 ENCOUNTER — Other Ambulatory Visit (HOSPITAL_COMMUNITY): Payer: Self-pay

## 2024-03-17 NOTE — Telephone Encounter (Signed)
 Patient called wanting to schedule colonoscopy. I told her we would call once we get our December schedule, she stated that she would like the second week of December.

## 2024-03-24 MED ORDER — CLENPIQ 10-3.5-12 MG-GM -GM/175ML PO SOLN: 1.0000 | ORAL | Status: DC

## 2024-03-24 NOTE — Addendum Note (Signed)
 Addended by: JEANELL GRAEME RAMAN on: 03/24/2024 10:38 AM   Modules accepted: Orders

## 2024-03-24 NOTE — Telephone Encounter (Signed)
 Medication Sample have been provided to the patient.  Drug name: CLENPIQ       Strength: 10MG /3.5g/12g per bottle        Qty: 1 box  LOT: [id number]  Exp.Date: 12/23/2024  Dosing instructions: AS DIRECTED FOR COLONOSCOPY  The patient has been instructed regarding the correct time, dose, and frequency of taking this medication, including desired effects and most common side effects.   Cynthia Donaldson 10:34 AM 03/24/2024

## 2024-03-24 NOTE — Telephone Encounter (Signed)
 Spoke with pt. She has been scheduled for 12/3. Advised will give sample of clenpiq and put instructions with sample to send with spouse. She voiced understanding.

## 2024-04-05 ENCOUNTER — Ambulatory Visit: Admitting: Oncology

## 2024-04-05 ENCOUNTER — Other Ambulatory Visit

## 2024-04-06 DIAGNOSIS — K743 Primary biliary cirrhosis: Secondary | ICD-10-CM | POA: Diagnosis not present

## 2024-04-06 DIAGNOSIS — R7401 Elevation of levels of liver transaminase levels: Secondary | ICD-10-CM | POA: Diagnosis not present

## 2024-04-06 DIAGNOSIS — E785 Hyperlipidemia, unspecified: Secondary | ICD-10-CM | POA: Diagnosis not present

## 2024-04-11 ENCOUNTER — Other Ambulatory Visit: Payer: Self-pay

## 2024-04-11 DIAGNOSIS — D696 Thrombocytopenia, unspecified: Secondary | ICD-10-CM

## 2024-04-11 DIAGNOSIS — D693 Immune thrombocytopenic purpura: Secondary | ICD-10-CM

## 2024-04-12 ENCOUNTER — Inpatient Hospital Stay (HOSPITAL_BASED_OUTPATIENT_CLINIC_OR_DEPARTMENT_OTHER): Admitting: Oncology

## 2024-04-12 ENCOUNTER — Inpatient Hospital Stay: Attending: Oncology

## 2024-04-12 VITALS — BP 123/79 | HR 65 | Temp 98.0°F | Resp 18 | Ht 69.5 in | Wt 218.0 lb

## 2024-04-12 DIAGNOSIS — D693 Immune thrombocytopenic purpura: Secondary | ICD-10-CM | POA: Diagnosis not present

## 2024-04-12 DIAGNOSIS — D696 Thrombocytopenia, unspecified: Secondary | ICD-10-CM

## 2024-04-12 DIAGNOSIS — K743 Primary biliary cirrhosis: Secondary | ICD-10-CM | POA: Diagnosis not present

## 2024-04-12 DIAGNOSIS — Z7952 Long term (current) use of systemic steroids: Secondary | ICD-10-CM | POA: Diagnosis not present

## 2024-04-12 DIAGNOSIS — Z800 Family history of malignant neoplasm of digestive organs: Secondary | ICD-10-CM | POA: Diagnosis not present

## 2024-04-12 DIAGNOSIS — Z87891 Personal history of nicotine dependence: Secondary | ICD-10-CM | POA: Diagnosis not present

## 2024-04-12 LAB — CBC
HCT: 46.5 % — ABNORMAL HIGH (ref 36.0–46.0)
Hemoglobin: 15.6 g/dL — ABNORMAL HIGH (ref 12.0–15.0)
MCH: 30.8 pg (ref 26.0–34.0)
MCHC: 33.5 g/dL (ref 30.0–36.0)
MCV: 91.9 fL (ref 80.0–100.0)
Platelets: 268 K/uL (ref 150–400)
RBC: 5.06 MIL/uL (ref 3.87–5.11)
RDW: 13.1 % (ref 11.5–15.5)
WBC: 7.6 K/uL (ref 4.0–10.5)
nRBC: 0 % (ref 0.0–0.2)

## 2024-04-12 NOTE — Progress Notes (Signed)
 [facility] [address], [place] [address]    Clinic Day:  04/12/2024  Referring physician: Sheryle Carwin, MD  Patient Care Team: Sheryle Carwin, MD as PCP - General (Internal Medicine)   ASSESSMENT & PLAN:   Assessment: 1.  Immune mediated thrombocytopenia: - Patient seen at the request of Dr. Eartha - CBC on 03/13/2023: PLT-33, Hb-15.7, WBC-6.9. - She has primary biliary cirrhosis, on Ocaliva  for the last few years, dose increased to 10 mg on 02/09/2023. - She denies drinking tonic water /using quinine supplements. - Reports easy bruising on the extremities for the last 2 months.  No active bleeding.  No recent infections or antibiotic use. - Dexamethasone  trial (40 mg daily x 4 days) on 03/19/2023 with platelet improvement from 34 to 177, with drop to 5 on 04/07/2023, transient response. - IVIG 1 g/kg on 04/08/2023 on 04/09/2023   2. Social/Family History:  -Retired. No chemical exposure. Quit tobacco use 2 years ago with 2 cigarettes a day  and off since age 22. -No family history of decreased platelets. Brother died of colon cancer. Father had stomach cancer. Brother has discoid lupus.  No family history of DVT/PE.    Plan: 1.  Immune mediated thrombocytopenia: -She denies any easy bruising or bleeding in the past  months. - Labs today: Platelet count is 2.  Hemoglobin and white cell count normal. - She had a good response after the last IVIG in November 2024. - If she has a relapse, we may try IVIG again.  Other options include TPO mimetic avatrombopag with slightly increased risk of thrombosis, SYK inhibitor fostamatinib (diarrhea and hypertension), and rituximab (infusion reaction and infection risk). -She will call us  to check her platelets sooner should she develop any signs of bleeding/bruising. -Patient would like to be discharged from clinic.  Dr. Sheryle keeps a close eye on her lab work.  Will refer her back as needed.  2.  Primary biliary  cirrhosis: - She remains on livdelzi  and ursodiol .  No orders of the defined types were placed in this encounter.   Cynthia FORBES Hope, NP   11/18/202511:47 AM  CHIEF COMPLAINT:   Diagnosis: Severe thrombocytopenia    Cancer Staging  No matching staging information was found for the patient.    Prior Therapy: Trial of dexamethasone , IVIG  Current Therapy: Observation   HISTORY OF PRESENT ILLNESS:   Oncology History   No history exists.     INTERVAL HISTORY:   Cynthia Donaldson is a 68 y.o. female presenting to clinic today for follow up of Severe thrombocytopenia.   Today, she states that she is doing well overall. Her appetite level is at 50%. Her energy level is at 25%. Wm continues to take ursodiol .  She has been taking Ozempic  and has lost over 35 pounds.  Reports her appetite is low due to this.  Has a cough secondary to allergies.  Has occasional diarrhea and nausea.  Has dizziness at times.  Has insomnia.  Denies any melena, hematochezia or bright red blood per rectum.  Denies any purpura.  Followed closely by her PCP Dr. Sheryle.  PAST MEDICAL HISTORY:   Past Medical History: Past Medical History:  Diagnosis Date   Bile duct stenosis    Hyperlipidemia    Hypothyroidism    PONV (postoperative nausea and vomiting)    Sleep apnea     Surgical History: Past Surgical History:  Procedure Laterality Date   anal fissure repair     BILIARY DILATION  12/26/2021   Procedure: BILIARY DILATION;  Surgeon: Wilhelmenia Aloha Raddle., MD;  Location: [facility] ENDOSCOPY;  Service: Gastroenterology;;   BIOPSY  12/26/2021   Procedure: BIOPSY;  Surgeon: Wilhelmenia Aloha Raddle., MD;  Location: [facility] ENDOSCOPY;  Service: Gastroenterology;;   CHOLECYSTECTOMY     COLONOSCOPY N/A 12/02/2017   Procedure: COLONOSCOPY;  Surgeon: Golda Claudis PENNER, MD;  Location: [facility] ENDO SUITE;  Service: Endoscopy;  Laterality: N/A;  1030   ERCP      X3   ERCP N/A 12/26/2021   Procedure: ENDOSCOPIC RETROGRADE  CHOLANGIOPANCREATOGRAPHY (ERCP);  Surgeon: Wilhelmenia Aloha Raddle., MD;  Location: [facility] ENDOSCOPY;  Service: Gastroenterology;  Laterality: N/A;   ESOPHAGOGASTRODUODENOSCOPY N/A 12/26/2021   Procedure: ESOPHAGOGASTRODUODENOSCOPY (EGD);  Surgeon: Wilhelmenia Aloha Raddle., MD;  Location: [facility] ENDOSCOPY;  Service: Gastroenterology;  Laterality: N/A;   MYOMECTOMY     REMOVAL OF STONES  12/26/2021   Procedure: REMOVAL OF STONES;  Surgeon: Mansouraty, Aloha Raddle., MD;  Location: [facility] ENDOSCOPY;  Service: Gastroenterology;;   ANNETT  12/26/2021   Procedure: ANNETT;  Surgeon: Mansouraty, Aloha Raddle., MD;  Location: [facility] ENDOSCOPY;  Service: Gastroenterology;;   TONSILLECTOMY      Social History: Social History   Socioeconomic History   Marital status: Married    Spouse name: Not on file   Number of children: Not on file   Years of education: Not on file   Highest education level: Not on file  Occupational History   Occupation: Homemaker  Tobacco Use   Smoking status: Former    Current packs/day: 0.00    Types: Cigarettes    Quit date: 2022    Years since quitting: 3.8   Smokeless tobacco: Never   Tobacco comments:    07/2019  Vaping Use   Vaping status: Never Used  Substance and Sexual Activity   Alcohol use: Yes    Comment: monthly   Drug use: No   Sexual activity: Not Currently    Birth control/protection: Post-menopausal  Other Topics Concern   Not on file  Social History Narrative   Not on file   Social Drivers of Health   Financial Resource Strain: Low Risk  (07/04/2021)   Overall Financial Resource Strain (CARDIA)    Difficulty of Paying Living Expenses: Not hard at all  Food Insecurity: No Food Insecurity (07/04/2021)   Hunger Vital Sign    Worried About Running Out of Food in the Last Year: Never true    Ran Out of Food in the Last Year: Never true  Transportation Needs: No Transportation Needs (07/04/2021)   PRAPARE - Administrator, Civil Service  (Medical): No    Lack of Transportation (Non-Medical): No  Physical Activity: Inactive (07/04/2021)   Exercise Vital Sign    Days of Exercise per Week: 0 days    Minutes of Exercise per Session: 0 min  Stress: No Stress Concern Present (07/04/2021)   [employer] of Occupational Health - Occupational Stress Questionnaire    Feeling of Stress : Not at all  Social Connections: Moderately Isolated (07/04/2021)   Social Connection and Isolation Panel    Frequency of Communication with Friends and Family: More than three times a week    Frequency of Social Gatherings with Friends and Family: Once a week    Attends Religious Services: Never    Database Administrator or Organizations: No    Attends Banker Meetings: Never    Marital Status: Married  Catering Manager Violence: Not At Risk (07/04/2021)  Humiliation, Afraid, Rape, and Kick questionnaire    Fear of Current or Ex-Partner: No    Emotionally Abused: No    Physically Abused: No    Sexually Abused: No    Family History: Family History  Problem Relation Age of Onset   Hypertension Father    Cancer Father        stomach   Heart disease Mother    Colon cancer Brother    Suicidality Brother    Hypertension Brother    Arthritis Brother    Hypertension Brother    Arthritis Brother    Hypertension Brother    Arthritis Brother    Heart attack Brother    Suicidality Sister    Heart disease Other    Arthritis Other    Cancer Other     Current Medications:  Current Outpatient Medications:    buPROPion  (WELLBUTRIN  XL) 150 MG 24 hr tablet, Take 3 tablets (450 mg total) by mouth daily., Disp: 270 tablet, Rfl: 4   clobetasol  ointment (TEMOVATE ) 0.05 %, Use as directed twice daily for 2 weeks to the affected area, then, use 2-3 times weekly. (Patient taking differently: as needed. Use as directed twice daily for 2 weeks to the affected area, then, use 2-3 times weekly.), Disp: 30 g, Rfl: 3   levothyroxine  (SYNTHROID )  150 MCG tablet, Take 1 tablet (150 mcg total) by mouth daily., Disp: 90 tablet, Rfl: 4   Seladelpar Lysine  (LIVDELZI ) 10 MG CAPS, Take 10 mg by mouth daily., Disp: 90 capsule, Rfl: 3   Semaglutide -Weight Management 0.5 MG/0.5ML SOAJ, Inject 0.5 mg once a week for 28 days., Disp: 2 mL, Rfl: 0   Semaglutide -Weight Management 1 MG/0.5ML SOAJ, Inject 1mg  once a week for 28 days, Disp: 2 mL, Rfl: 0   Semaglutide -Weight Management 2.4 MG/0.75ML SOAJ, Inject 2.4 mg into the skin once a week., Disp: 3 mL, Rfl: 2   Sod Picosulfate-Mag Ox-Cit Acd (CLENPIQ) 10-3.5-12 MG-GM -GM/175ML SOLN, Take 1 kit by mouth as directed., Disp: , Rfl:    triamcinolone  cream (KENALOG ) 0.1 %, Apply sparingly to affected areas 2 times daily., Disp: 60 g, Rfl: 2   ursodiol  (ACTIGALL ) 500 MG tablet, Take 1 tablet (500 mg total) by mouth 3 (three) times daily., Disp: 270 tablet, Rfl: 4  Current Facility-Administered Medications:    methylPREDNISolone  acetate (DEPO-MEDROL ) injection 40 mg, 40 mg, Intra-articular, Once, Margrette Taft BRAVO, MD   Allergies: Allergies  Allergen Reactions   Codeine Nausea And Vomiting    REVIEW OF SYSTEMS:   Review of Systems  Constitutional:  Positive for appetite change and fatigue.  Respiratory:  Positive for cough.   Gastrointestinal:  Positive for diarrhea and nausea.  Neurological:  Positive for dizziness and headaches.  Psychiatric/Behavioral:  Positive for sleep disturbance.      VITALS:   Blood pressure 123/79, pulse 65, temperature 98 F (36.7 C), temperature source Tympanic, resp. rate 18, height 5' 9.5 (1.765 m), weight 218 lb (98.9 kg), SpO2 97%.  Wt Readings from Last 3 Encounters:  04/12/24 218 lb (98.9 kg)  02/11/24 226 lb 9.6 oz (102.8 kg)  09/29/23 244 lb (110.7 kg)    Body mass index is 31.73 kg/m.  Performance status (ECOG): 1 - Symptomatic but completely ambulatory  PHYSICAL EXAM:   Physical Exam Constitutional:      Appearance: Normal appearance.   Cardiovascular:     Rate and Rhythm: Normal rate and regular rhythm.  Pulmonary:     Effort: Pulmonary effort is normal.  Breath sounds: Normal breath sounds.  Abdominal:     General: Bowel sounds are normal.     Palpations: Abdomen is soft.  Musculoskeletal:        General: No swelling. Normal range of motion.  Neurological:     Mental Status: She is alert and oriented to person, place, and time. Mental status is at baseline.     LABS:      Latest Ref Rng & Units 04/12/2024   10:07 AM 11/05/2023    3:52 PM 09/29/2023    1:00 PM  CBC  WBC 4.0 - 10.5 K/uL 7.6  7.9  7.6   Hemoglobin 12.0 - 15.0 g/dL 84.3  84.2  85.3   Hematocrit 36.0 - 46.0 % 46.5  49.0  44.9   Platelets 150 - 400 K/uL 268  171  235       Latest Ref Rng & Units 01/18/2024    2:26 PM 11/05/2023    3:52 PM 03/13/2023   11:40 AM  CMP  Glucose 70 - 99 mg/dL 81  898  91   BUN 8 - 27 mg/dL 15  16  16    Creatinine 0.57 - 1.00 mg/dL 9.02  9.02  8.92   Sodium 134 - 144 mmol/L 140  142  144   Potassium 3.5 - 5.2 mmol/L 4.6  4.6  3.9   Chloride 96 - 106 mmol/L 104  105  108   CO2 20 - 29 mmol/L 23  22  20    Calcium 8.7 - 10.3 mg/dL 9.5  9.4  9.1   Total Protein 6.0 - 8.5 g/dL 6.8  6.9  7.1   Total Bilirubin 0.0 - 1.2 mg/dL 0.8  0.3  0.6   Alkaline Phos 44 - 121 IU/L 143  163  156   AST 0 - 40 IU/L 28  44  30   ALT 0 - 32 IU/L 39  59  43      No results found for: CEA1, CEA / No results found for: CEA1, CEA No results found for: PSA1 No results found for: CAN199 No results found for: RJW874  Lab Results  Component Value Date   TOTALPROTELP 6.6 03/19/2023   ALBUMINELP 3.7 03/19/2023   A1GS 0.3 03/19/2023   A2GS 0.6 03/19/2023   BETS 1.3 03/19/2023   GAMS 0.7 03/19/2023   MSPIKE Not Observed 03/19/2023   SPEI Comment 03/19/2023   Lab Results  Component Value Date   TIBC 374 12/26/2021   FERRITIN 362 (H) 12/26/2021   IRONPCTSAT 35 (H) 12/26/2021   Lab Results  Component Value Date    LDH 177 03/19/2023     STUDIES:   No results found.

## 2024-04-13 DIAGNOSIS — K7581 Nonalcoholic steatohepatitis (NASH): Secondary | ICD-10-CM | POA: Diagnosis not present

## 2024-04-13 DIAGNOSIS — E785 Hyperlipidemia, unspecified: Secondary | ICD-10-CM | POA: Diagnosis not present

## 2024-04-13 DIAGNOSIS — Z23 Encounter for immunization: Secondary | ICD-10-CM | POA: Diagnosis not present

## 2024-04-27 ENCOUNTER — Encounter (HOSPITAL_COMMUNITY): Payer: Self-pay | Admitting: Gastroenterology

## 2024-04-27 ENCOUNTER — Ambulatory Visit (HOSPITAL_COMMUNITY)

## 2024-04-27 ENCOUNTER — Other Ambulatory Visit: Payer: Self-pay

## 2024-04-27 ENCOUNTER — Ambulatory Visit (HOSPITAL_COMMUNITY)
Admission: RE | Admit: 2024-04-27 | Discharge: 2024-04-27 | Disposition: A | Discharge status: Home / Self Care | Attending: Gastroenterology | Admitting: Gastroenterology

## 2024-04-27 ENCOUNTER — Encounter (HOSPITAL_COMMUNITY)
Admission: RE | Disposition: A | Discharge status: Home / Self Care | Payer: Self-pay | Source: Home / Self Care | Attending: Gastroenterology

## 2024-04-27 DIAGNOSIS — Z87891 Personal history of nicotine dependence: Secondary | ICD-10-CM | POA: Diagnosis not present

## 2024-04-27 DIAGNOSIS — D693 Immune thrombocytopenic purpura: Secondary | ICD-10-CM | POA: Diagnosis not present

## 2024-04-27 DIAGNOSIS — D122 Benign neoplasm of ascending colon: Secondary | ICD-10-CM

## 2024-04-27 DIAGNOSIS — Z860100 Personal history of colon polyps, unspecified: Secondary | ICD-10-CM

## 2024-04-27 DIAGNOSIS — K7581 Nonalcoholic steatohepatitis (NASH): Secondary | ICD-10-CM | POA: Diagnosis not present

## 2024-04-27 DIAGNOSIS — K5730 Diverticulosis of large intestine without perforation or abscess without bleeding: Secondary | ICD-10-CM

## 2024-04-27 DIAGNOSIS — Z1211 Encounter for screening for malignant neoplasm of colon: Secondary | ICD-10-CM | POA: Diagnosis not present

## 2024-04-27 DIAGNOSIS — G4730 Sleep apnea, unspecified: Secondary | ICD-10-CM | POA: Diagnosis not present

## 2024-04-27 DIAGNOSIS — Z860101 Personal history of adenomatous and serrated colon polyps: Secondary | ICD-10-CM | POA: Diagnosis not present

## 2024-04-27 DIAGNOSIS — G4733 Obstructive sleep apnea (adult) (pediatric): Secondary | ICD-10-CM | POA: Diagnosis not present

## 2024-04-27 DIAGNOSIS — F32A Depression, unspecified: Secondary | ICD-10-CM | POA: Diagnosis not present

## 2024-04-27 DIAGNOSIS — K635 Polyp of colon: Secondary | ICD-10-CM | POA: Diagnosis not present

## 2024-04-27 DIAGNOSIS — E039 Hypothyroidism, unspecified: Secondary | ICD-10-CM | POA: Diagnosis not present

## 2024-04-27 HISTORY — PX: COLONOSCOPY: SHX5424

## 2024-04-27 HISTORY — PX: POLYPECTOMY: SHX149

## 2024-04-27 LAB — HM COLONOSCOPY

## 2024-04-27 SURGERY — COLONOSCOPY
Anesthesia: General

## 2024-04-27 MED ORDER — LACTATED RINGERS IV SOLN: INTRAVENOUS | Status: DC | PRN

## 2024-04-27 MED ORDER — PROPOFOL 500 MG/50ML IV EMUL
INTRAVENOUS | Status: DC | PRN
  Administered 2024-04-27: 150 ug/kg/min via INTRAVENOUS
  Administered 2024-04-27: 20 mg via INTRAVENOUS
  Administered 2024-04-27: 120 mg via INTRAVENOUS

## 2024-04-27 MED ORDER — LIDOCAINE 2% (20 MG/ML) 5 ML SYRINGE: INTRAMUSCULAR | Status: DC | PRN

## 2024-04-27 NOTE — Op Note (Signed)
 [facility] Patient Name: Cynthia Donaldson Procedure Date: 04/27/2024 8:26 AM MRN: [id number] Date of Birth: [birth year] Attending MD: Toribio Fortune , , [id number] CSN: [id number] Age: 68 Admit Type: Outpatient Procedure:                Colonoscopy Indications:              High risk colon cancer surveillance: Personal                            history of colonic polyps Providers:                Toribio Fortune, Jon LABOR. Gerome RN, RN, Chad                            Wilson, Technician Referring MD:             Toribio Fortune Medicines:                Monitored Anesthesia Care Complications:            No immediate complications. Estimated Blood Loss:     Estimated blood loss: none. Procedure:                Pre-Anesthesia Assessment:                           - Prior to the procedure, a History and Physical                            was performed, and patient medications, allergies                            and sensitivities were reviewed. The patient's                            tolerance of previous anesthesia was reviewed.                           - The risks and benefits of the procedure and the                            sedation options and risks were discussed with the                            patient. All questions were answered and informed                            consent was obtained.                           - ASA Grade Assessment: II - A patient with mild                            systemic disease.                           After obtaining informed consent, the colonoscope  was passed under direct vision. Throughout the                            procedure, the patient's blood pressure, pulse, and                            oxygen saturations were monitored continuously. The                            [id number] ([id number]) was introduced through the                            anus and advanced to the the cecum, identified by                             appendiceal orifice and ileocecal valve. The                            colonoscopy was performed without difficulty. The                            patient tolerated the procedure well. The quality                            of the bowel preparation was adequate to identify                            polyps. Scope In: 8:40:02 AM Scope Out: 8:54:23 AM Scope Withdrawal Time: 0 hours 10 minutes 7 seconds  Total Procedure Duration: 0 hours 14 minutes 21 seconds  Findings:      The perianal and digital rectal examinations were normal.      Two sessile polyps were found in the ascending colon. The polyps were 2       to 6 mm in size. These polyps were removed with a cold snare. Resection       and retrieval were complete.      Scattered medium-mouthed and small-mouthed diverticula were found in the       sigmoid colon and descending colon.      The retroflexed view of the distal rectum and anal verge was normal and       showed no anal or rectal abnormalities. Impression:               - Two 2 to 6 mm polyps in the ascending colon,                            removed with a cold snare. Resected and retrieved.                           - Diverticulosis in the sigmoid colon and in the                            descending colon.                           -  The distal rectum and anal verge are normal on                            retroflexion view. Moderate Sedation:      Per Anesthesia Care Recommendation:           - Discharge patient to home (ambulatory).                           - Resume previous diet.                           - Await pathology results.                           - Repeat colonoscopy for surveillance based on                            pathology results. Procedure Code(s):        --- Professional ---                           [phone], Colonoscopy, flexible; with removal of                            tumor(s), polyp(s), or other lesion(s) by snare                             technique Diagnosis Code(s):        --- Professional ---                           Z86.010, Personal history of colonic polyps                           D12.2, Benign neoplasm of ascending colon                           K57.30, Diverticulosis of large intestine without                            perforation or abscess without bleeding CPT copyright 2022 American Medical Association. All rights reserved. The codes documented in this report are preliminary and upon coder review may  be revised to meet current compliance requirements. Toribio Fortune, MD Toribio Fortune,  04/27/2024 8:59:31 AM This report has been signed electronically. Number of Addenda: 0

## 2024-04-27 NOTE — Discharge Instructions (Signed)
 You are being discharged to home.  Resume your previous diet.  We are waiting for your pathology results.  Your physician has recommended a repeat colonoscopy for surveillance based on pathology results.

## 2024-04-27 NOTE — H&P (Signed)
 Cynthia Donaldson is an 68 y.o. female.   Chief Complaint:  history of colon polyps. HPI: Cynthia Donaldson is a 68 y.o. female with history of Hollie, obesity, depression, hypothyroidism, ITP, primary biliary cholangitis and recurrent choledocholithiasis, who presents for history of colon polyps.  Last colonoscopy 5 years ago, w polyps were removed which had benign lymphoid tissue. The patient denies having any complaints such as melena, hematochezia, abdominal pain or distention, change in her bowel movement consistency or frequency, no changes in weight recently.  No family history of colorectal cancer.   Past Medical History:  Diagnosis Date   Bile duct stenosis (HCC)    Hyperlipidemia    Hypothyroidism    PONV (postoperative nausea and vomiting)    Sleep apnea     Past Surgical History:  Procedure Laterality Date   anal fissure repair     BILIARY DILATION  12/26/2021   Procedure: BILIARY DILATION;  Surgeon: Wilhelmenia Aloha Raddle., MD;  Location: [facility] ENDOSCOPY;  Service: Gastroenterology;;   BIOPSY  12/26/2021   Procedure: BIOPSY;  Surgeon: Wilhelmenia Aloha Raddle., MD;  Location: [facility] ENDOSCOPY;  Service: Gastroenterology;;   CHOLECYSTECTOMY     COLONOSCOPY N/A 12/02/2017   Procedure: COLONOSCOPY;  Surgeon: Golda Claudis PENNER, MD;  Location: [facility] ENDO [facility];  Service: Endoscopy;  Laterality: N/A;  1030   ERCP      X3   ERCP N/A 12/26/2021   Procedure: ENDOSCOPIC RETROGRADE CHOLANGIOPANCREATOGRAPHY (ERCP);  Surgeon: Wilhelmenia Aloha Raddle., MD;  Location: [facility] ENDOSCOPY;  Service: Gastroenterology;  Laterality: N/A;   ESOPHAGOGASTRODUODENOSCOPY N/A 12/26/2021   Procedure: ESOPHAGOGASTRODUODENOSCOPY (EGD);  Surgeon: Wilhelmenia Aloha Raddle., MD;  Location: [facility] ENDOSCOPY;  Service: Gastroenterology;  Laterality: N/A;   MYOMECTOMY     REMOVAL OF STONES  12/26/2021   Procedure: REMOVAL OF STONES;  Surgeon: Mansouraty, Aloha Raddle., MD;  Location: [facility] ENDOSCOPY;  Service: Gastroenterology;;   ANNETT   12/26/2021   Procedure: ANNETT;  Surgeon: Mansouraty, Aloha Raddle., MD;  Location: [facility] ENDOSCOPY;  Service: Gastroenterology;;   TONSILLECTOMY      Family History  Problem Relation Age of Onset   Hypertension Father    Cancer Father        stomach   Heart disease Mother    Colon cancer Brother    Suicidality Brother    Hypertension Brother    Arthritis Brother    Hypertension Brother    Arthritis Brother    Hypertension Brother    Arthritis Brother    Heart attack Brother    Suicidality Sister    Heart disease Other    Arthritis Other    Cancer Other    Social History:  reports that she quit smoking about 3 years ago. Her smoking use included cigarettes. She has never used smokeless tobacco. She reports current alcohol use. She reports that she does not use drugs.  Allergies:  Allergies  Allergen Reactions   Codeine Nausea And Vomiting    Facility-Administered Medications Prior to Admission  Medication Dose Route Frequency Provider Last Rate Last Admin   methylPREDNISolone  acetate (DEPO-MEDROL ) injection 40 mg  40 mg Intra-articular Once Harrison, Stanley E, MD       Medications Prior to Admission  Medication Sig Dispense Refill   buPROPion  (WELLBUTRIN  XL) 150 MG 24 hr tablet Take 3 tablets (450 mg total) by mouth daily. 270 tablet 4   clobetasol  ointment (TEMOVATE ) 0.05 % Use as directed twice daily for 2 weeks to the affected area, then, use 2-3 times weekly. (Patient  taking differently: as needed. Use as directed twice daily for 2 weeks to the affected area, then, use 2-3 times weekly.) 30 g 3   levothyroxine  (SYNTHROID ) 150 MCG tablet Take 1 tablet (150 mcg total) by mouth daily. 90 tablet 4   Seladelpar Lysine  (LIVDELZI ) 10 MG CAPS Take 10 mg by mouth daily. 90 capsule 3   Sod Picosulfate-Mag Ox-Cit Acd (CLENPIQ ) 10-3.5-12 MG-GM -GM/175ML SOLN Take 1 kit by mouth as directed.     triamcinolone  cream (KENALOG ) 0.1 % Apply sparingly to affected areas 2 times daily.  60 g 2   ursodiol  (ACTIGALL ) 500 MG tablet Take 1 tablet (500 mg total) by mouth 3 (three) times daily. 270 tablet 4   Semaglutide -Weight Management 0.5 MG/0.5ML SOAJ Inject 0.5 mg once a week for 28 days. 2 mL 0   Semaglutide -Weight Management 1 MG/0.5ML SOAJ Inject 1mg  once a week for 28 days 2 mL 0   Semaglutide -Weight Management 2.4 MG/0.75ML SOAJ Inject 2.4 mg into the skin once a week. 3 mL 2    No results found for this or any previous visit (from the past 48 hours). No results found.  Review of Systems  All other systems reviewed and are negative.   Pulse 69, temperature 98.1 F (36.7 C), temperature source Oral, resp. rate (!) 22, SpO2 95%. Physical Exam  GENERAL: The patient is AO x3, in no acute distress. HEENT: Head is normocephalic and atraumatic. EOMI are intact. Mouth is well hydrated and without lesions. NECK: Supple. No masses LUNGS: Clear to auscultation. No presence of rhonchi/wheezing/rales. Adequate chest expansion HEART: RRR, normal s1 and s2. ABDOMEN: Soft, nontender, no guarding, no peritoneal signs, and nondistended. BS +. No masses. EXTREMITIES: Without any cyanosis, clubbing, rash, lesions or edema. NEUROLOGIC: AOx3, no focal motor deficit. SKIN: no jaundice, no rashes  Assessment/Plan Cynthia Donaldson is a 68 y.o. female with history of Hollie, obesity, depression, hypothyroidism, ITP, primary biliary cholangitis and recurrent choledocholithiasis, who presents for history of colon polyps. We will proceed with colonoscopy.  Toribio Eartha Flavors, MD 04/27/2024, 8:32 AM

## 2024-04-27 NOTE — Anesthesia Postprocedure Evaluation (Signed)
 Anesthesia Post Note  Patient: Cynthia Donaldson  Procedure(s) Performed: COLONOSCOPY POLYPECTOMY, INTESTINE  Patient location during evaluation: PACU Anesthesia Type: General Level of consciousness: awake and alert Pain management: pain level controlled Vital Signs Assessment: post-procedure vital signs reviewed and stable Respiratory status: spontaneous breathing, nonlabored ventilation, respiratory function stable and patient connected to nasal cannula oxygen Cardiovascular status: blood pressure returned to baseline and stable Postop Assessment: no apparent nausea or vomiting Anesthetic complications: no   No notable events documented.   Last Vitals:  Vitals:   04/27/24 0859 04/27/24 0905  BP: (!) 84/52 (!) 93/55  Pulse: 62 66  Resp: 16 17  Temp: 36.6 C   SpO2: 95%     Last Pain:  Vitals:   04/27/24 0905  TempSrc:   PainSc: 0-No pain                 Andrea Limes

## 2024-04-27 NOTE — Anesthesia Procedure Notes (Signed)
 Date/Time: 04/27/2024 9:34 AM  Performed by: Barbarann Verneita RAMAN, CRNAPre-anesthesia Checklist: Patient identified, Emergency Drugs available, Suction available, Timeout performed and Patient being monitored Patient Re-evaluated:Patient Re-evaluated prior to induction Oxygen Delivery Method: Nasal cannula Comments: Optiflow

## 2024-04-27 NOTE — Anesthesia Preprocedure Evaluation (Addendum)
 Anesthesia Evaluation  Patient identified by MRN, date of birth, ID band Patient awake  General Assessment Comment:Has been told that she desats quickly with sedation  Reviewed: Allergy & Precautions, H&P , NPO status , Patient's Chart, lab work & pertinent test results  History of Anesthesia Complications (+) PONV and history of anesthetic complications  Airway Mallampati: III  TM Distance: >3 FB Neck ROM: Full    Dental no notable dental hx.    Pulmonary sleep apnea and Continuous Positive Airway Pressure Ventilation , former smoker Mod-severe OSA    Pulmonary exam normal breath sounds clear to auscultation       Cardiovascular negative cardio ROS Normal cardiovascular exam Rhythm:Regular Rate:Normal     Neuro/Psych  PSYCHIATRIC DISORDERS  Depression    negative neurological ROS     GI/Hepatic negative GI ROS,,,(+) Hepatitis -NASH   Endo/Other  Hypothyroidism    Renal/GU negative Renal ROS  negative genitourinary   Musculoskeletal negative musculoskeletal ROS (+)    Abdominal   Peds negative pediatric ROS (+)  Hematology  (+) Blood dyscrasia, anemia   Anesthesia Other Findings   Reproductive/Obstetrics negative OB ROS                              Anesthesia Physical Anesthesia Plan  ASA: 3  Anesthesia Plan: General   Post-op Pain Management:    Induction: Intravenous  PONV Risk Score and Plan:   Airway Management Planned: Nasal Cannula  Additional Equipment:   Intra-op Plan:   Post-operative Plan:   Informed Consent: I have reviewed the patients History and Physical, chart, labs and discussed the procedure including the risks, benefits and alternatives for the proposed anesthesia with the patient or authorized representative who has indicated his/her understanding and acceptance.     Dental advisory given  Plan Discussed with: CRNA  Anesthesia Plan Comments:           Anesthesia Quick Evaluation

## 2024-04-27 NOTE — Transfer of Care (Signed)
 Immediate Anesthesia Transfer of Care Note  Patient: Cynthia Donaldson  Procedure(s) Performed: COLONOSCOPY POLYPECTOMY, INTESTINE  Patient Location: Endoscopy Unit  Anesthesia Type:General  Level of Consciousness: drowsy and patient cooperative  Airway & Oxygen Therapy: Patient Spontanous Breathing  Post-op Assessment: Report given to RN and Post -op Vital signs reviewed and stable  Post vital signs: Reviewed and stable  Last Vitals:  Vitals Value Taken Time  BP 84/52 04/27/24 08:59  Temp 36.6 C 04/27/24 08:59  Pulse 62 04/27/24 08:59  Resp 16 04/27/24 08:59  SpO2 95 % 04/27/24 08:59    Last Pain:  Vitals:   04/27/24 0859  TempSrc: Oral  PainSc: 0-No pain      Patients Stated Pain Goal: 3 (04/27/24 0729)  Complications: No notable events documented.

## 2024-04-28 ENCOUNTER — Encounter (HOSPITAL_COMMUNITY): Payer: Self-pay | Admitting: Gastroenterology

## 2024-04-28 ENCOUNTER — Ambulatory Visit (INDEPENDENT_AMBULATORY_CARE_PROVIDER_SITE_OTHER): Payer: Self-pay | Admitting: Gastroenterology

## 2024-04-28 ENCOUNTER — Encounter (INDEPENDENT_AMBULATORY_CARE_PROVIDER_SITE_OTHER): Payer: Self-pay | Admitting: *Deleted

## 2024-04-28 LAB — SURGICAL PATHOLOGY

## 2024-05-03 ENCOUNTER — Other Ambulatory Visit (HOSPITAL_COMMUNITY): Payer: Self-pay

## 2024-05-05 NOTE — Progress Notes (Signed)
 5 yr TCS noted in recall Patient result letter mailed procedure note and pathology result faxed to PCP

## 2024-05-10 ENCOUNTER — Other Ambulatory Visit (HOSPITAL_COMMUNITY): Payer: Self-pay

## 2024-05-17 ENCOUNTER — Other Ambulatory Visit (HOSPITAL_COMMUNITY): Payer: Self-pay

## 2024-05-18 ENCOUNTER — Other Ambulatory Visit (HOSPITAL_COMMUNITY): Payer: Self-pay

## 2024-05-23 ENCOUNTER — Other Ambulatory Visit: Payer: Self-pay

## 2024-05-23 ENCOUNTER — Encounter: Payer: Self-pay | Admitting: *Deleted

## 2024-05-23 DIAGNOSIS — G4733 Obstructive sleep apnea (adult) (pediatric): Secondary | ICD-10-CM | POA: Diagnosis not present

## 2024-05-25 ENCOUNTER — Other Ambulatory Visit (HOSPITAL_COMMUNITY): Payer: Self-pay

## 2024-06-01 ENCOUNTER — Telehealth: Payer: Self-pay | Admitting: Gastroenterology

## 2024-06-01 ENCOUNTER — Other Ambulatory Visit: Payer: Self-pay

## 2024-06-01 ENCOUNTER — Other Ambulatory Visit (HOSPITAL_COMMUNITY): Payer: Self-pay

## 2024-06-01 MED ORDER — WEGOVY 0.5 MG/0.5ML ~~LOC~~ SOAJ
0.5000 mg | SUBCUTANEOUS | 0 refills | Status: AC
  Filled 2024-06-01 – 2024-06-03 (×3): qty 2, 28d supply, fill #0

## 2024-06-01 NOTE — Telephone Encounter (Signed)
 Spoke with patient and will be working on transitioning from Ozempic  1 mg to Wegovy . Hopefully, with history of MASH, insurance will cover this as now FDA approved for F2/F3 fibrosis and also sleep apnea.   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   Crystal: I have sent in Wegovy  0.5 mg to start, as she is already on 1mg  Ozempic . They are both semaglutide , but I would rather be cautious and ensure she does well with Wegovy  before increasing to 1.0 mg. 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 Sleep apnea. We can send in the liver biopsy dated 12/28/2017 if needed. Thank you!

## 2024-06-01 NOTE — Telephone Encounter (Signed)
 Awaiting the Prior Auth to come in from [employer].

## 2024-06-02 ENCOUNTER — Encounter (HOSPITAL_COMMUNITY): Payer: Self-pay

## 2024-06-03 ENCOUNTER — Other Ambulatory Visit (HOSPITAL_COMMUNITY): Payer: Self-pay

## 2024-06-03 NOTE — Telephone Encounter (Signed)
 I called [facility] and spoke with [facility] the pharmacist there she states [facility] does not cover Wegovy  or Zepbound, and they can not do a PA as they state [employer] insurance will not pay. She says patient to go to [email or web address] and get the co pay card and call them at [phone] with that card information and they can fill it for her at that time. She says [facility] only covers Monjuro, or ozempic  for certain disease like DM.   I mentioned the patient also has Medicare she says Medicare does not pay for Wegovy , but to check with the patient to see if she has a Medicare part d supplementation, if she does they would like the patient to call them with this as they may can get it covered through the part d.

## 2024-06-03 NOTE — Telephone Encounter (Signed)
 I spoke with the patient and made her aware per [facility],  Per [place], if mediation is a long term medication, and patient is covered by [employer] patient must us  a [place]-mc[place]e, They can not use an alternate pharmacy.      Me    06/03/24 10:07 AM Note I called [facility] and spoke with [facility] the pharmacist there she states [facility] does not cover Wegovy  or Zepbound, and they can not do a PA as they state [employer] insurance will not pay. She says patient to go to [email or web address] and get the co pay card and call them at [phone] with that card information and they can fill it for her at that time. She says [facility] only covers Monjuro, or ozempic  for certain disease like DM.    I mentioned the patient also has Medicare she says Medicare does not pay for Wegovy , but to check with the patient to see if she has a Medicare part d supplementation, if she does they would like the patient to call them with this as they may can get it covered through the part d.    Patient states understanding and will go to [email or web address] and get the co pay card information and will call the pharmacy back at [phone]. She will let me know if We need to do anything else for her.

## 2024-06-03 NOTE — Telephone Encounter (Signed)
 Addendum :  Per [place], if mediation is a long term medication, and patient is covered by [employer] patient must us  a Kerr-mcgee, They can not use an alternate pharmacy.

## 2024-06-06 ENCOUNTER — Other Ambulatory Visit (HOSPITAL_COMMUNITY): Payer: Self-pay

## 2024-06-08 NOTE — Telephone Encounter (Signed)
 Note sent through Secure chat, to [facility], asking for an update.

## 2024-06-13 NOTE — Telephone Encounter (Signed)
 I spoke with the patient whom states she is going to use [facility] pharmacy for the Wegovy  and she will be getting this at $ 199.00 per fill, as the discount card did not give much of a discount.

## 2024-06-14 NOTE — Telephone Encounter (Signed)
 Thank you, Crystal.  This is correct.  She has decided to stay with the [facility] pharmacy for now as she will be getting this at a reduced rate that will be less expensive than using [facility].
